# Patient Record
Sex: Male | Born: 1950
Health system: Southern US, Community
[De-identification: ages and names within clinical notes are randomized; demographics above are authoritative.]

## PROBLEM LIST (undated history)

## (undated) DIAGNOSIS — E782 Mixed hyperlipidemia: Secondary | ICD-10-CM

## (undated) DIAGNOSIS — I350 Nonrheumatic aortic (valve) stenosis: Secondary | ICD-10-CM

## (undated) DIAGNOSIS — M1990 Unspecified osteoarthritis, unspecified site: Secondary | ICD-10-CM

## (undated) DIAGNOSIS — I10 Essential (primary) hypertension: Secondary | ICD-10-CM

## (undated) DIAGNOSIS — C439 Malignant melanoma of skin, unspecified: Secondary | ICD-10-CM

## (undated) DIAGNOSIS — F419 Anxiety disorder, unspecified: Secondary | ICD-10-CM

## (undated) DIAGNOSIS — I499 Cardiac arrhythmia, unspecified: Secondary | ICD-10-CM

## (undated) DIAGNOSIS — I2510 Atherosclerotic heart disease of native coronary artery without angina pectoris: Secondary | ICD-10-CM

## (undated) DIAGNOSIS — F32A Depression, unspecified: Secondary | ICD-10-CM

## (undated) DIAGNOSIS — Q2381 Bicuspid aortic valve: Secondary | ICD-10-CM

## (undated) DIAGNOSIS — G4730 Sleep apnea, unspecified: Secondary | ICD-10-CM

## (undated) DIAGNOSIS — I493 Ventricular premature depolarization: Secondary | ICD-10-CM

## (undated) DIAGNOSIS — R011 Cardiac murmur, unspecified: Secondary | ICD-10-CM

## (undated) DIAGNOSIS — Q231 Congenital insufficiency of aortic valve: Secondary | ICD-10-CM

## (undated) HISTORY — DX: Malignant melanoma of skin, unspecified: C43.9

## (undated) HISTORY — DX: Essential (primary) hypertension: I10

## (undated) HISTORY — DX: Bicuspid aortic valve: Q23.81

## (undated) HISTORY — DX: Atherosclerotic heart disease of native coronary artery without angina pectoris: I25.10

## (undated) HISTORY — DX: Ventricular premature depolarization: I49.3

## (undated) HISTORY — DX: Mixed hyperlipidemia: E78.2

## (undated) HISTORY — DX: Nonrheumatic aortic (valve) stenosis: I35.0

## (undated) HISTORY — DX: Unspecified osteoarthritis, unspecified site: M19.90

## (undated) HISTORY — DX: Anxiety disorder, unspecified: F41.9

## (undated) HISTORY — PX: TONSILLECTOMY: SUR1361

## (undated) HISTORY — DX: Congenital insufficiency of aortic valve: Q23.1

---

## 1991-05-25 DIAGNOSIS — C439 Malignant melanoma of skin, unspecified: Secondary | ICD-10-CM

## 1991-05-25 HISTORY — DX: Malignant melanoma of skin, unspecified: C43.9

## 2008-03-18 ENCOUNTER — Ambulatory Visit: Payer: Self-pay | Admitting: Internal Medicine

## 2008-03-18 DIAGNOSIS — I10 Essential (primary) hypertension: Secondary | ICD-10-CM | POA: Insufficient documentation

## 2008-03-18 DIAGNOSIS — M1990 Unspecified osteoarthritis, unspecified site: Secondary | ICD-10-CM | POA: Insufficient documentation

## 2008-03-18 DIAGNOSIS — I493 Ventricular premature depolarization: Secondary | ICD-10-CM | POA: Insufficient documentation

## 2008-03-18 DIAGNOSIS — E785 Hyperlipidemia, unspecified: Secondary | ICD-10-CM | POA: Insufficient documentation

## 2008-03-18 DIAGNOSIS — Z85828 Personal history of other malignant neoplasm of skin: Secondary | ICD-10-CM | POA: Insufficient documentation

## 2008-03-18 DIAGNOSIS — F411 Generalized anxiety disorder: Secondary | ICD-10-CM | POA: Insufficient documentation

## 2008-03-19 ENCOUNTER — Encounter (INDEPENDENT_AMBULATORY_CARE_PROVIDER_SITE_OTHER): Payer: Self-pay | Admitting: Internal Medicine

## 2008-03-20 LAB — CONVERTED CEMR LAB
AST: 17 units/L (ref 0–37)
Alkaline Phosphatase: 63 units/L (ref 39–117)
BUN: 17 mg/dL (ref 6–23)
Calcium: 9.1 mg/dL (ref 8.4–10.5)
Chloride: 106 meq/L (ref 96–112)
Creatinine, Ser: 0.84 mg/dL (ref 0.40–1.50)
HDL: 53 mg/dL (ref >39)
PSA: 0.42 ng/mL (ref 0.10–4.00)
Total Bilirubin: 0.6 mg/dL (ref 0.3–1.2)
Total CHOL/HDL Ratio: 3.8

## 2008-04-08 ENCOUNTER — Encounter (INDEPENDENT_AMBULATORY_CARE_PROVIDER_SITE_OTHER): Payer: Self-pay | Admitting: Internal Medicine

## 2008-04-11 ENCOUNTER — Ambulatory Visit: Payer: Self-pay | Admitting: Internal Medicine

## 2008-10-16 ENCOUNTER — Ambulatory Visit: Payer: Self-pay | Admitting: Internal Medicine

## 2008-10-16 DIAGNOSIS — R002 Palpitations: Secondary | ICD-10-CM | POA: Insufficient documentation

## 2008-10-16 DIAGNOSIS — F1010 Alcohol abuse, uncomplicated: Secondary | ICD-10-CM | POA: Insufficient documentation

## 2008-10-17 ENCOUNTER — Encounter (INDEPENDENT_AMBULATORY_CARE_PROVIDER_SITE_OTHER): Payer: Self-pay | Admitting: Internal Medicine

## 2008-10-17 ENCOUNTER — Encounter (INDEPENDENT_AMBULATORY_CARE_PROVIDER_SITE_OTHER): Payer: Self-pay | Admitting: *Deleted

## 2008-10-17 LAB — CONVERTED CEMR LAB
BUN: 14 mg/dL
CO2: 23 meq/L (ref 19–32)
Calcium: 8.7 mg/dL
Calcium: 8.7 mg/dL (ref 8.4–10.5)
Creatinine, Ser: 0.86 mg/dL (ref 0.40–1.50)
Glucose, Bld: 89 mg/dL
Sodium: 143 meq/L

## 2008-10-23 ENCOUNTER — Emergency Department (HOSPITAL_COMMUNITY): Admission: EM | Admit: 2008-10-23 | Discharge: 2008-10-23 | Payer: Self-pay | Admitting: Emergency Medicine

## 2008-10-24 ENCOUNTER — Observation Stay (HOSPITAL_COMMUNITY): Admission: EM | Admit: 2008-10-24 | Discharge: 2008-10-25 | Payer: Self-pay | Admitting: Emergency Medicine

## 2008-10-24 ENCOUNTER — Ambulatory Visit: Payer: Self-pay | Admitting: Cardiology

## 2008-10-25 ENCOUNTER — Encounter (INDEPENDENT_AMBULATORY_CARE_PROVIDER_SITE_OTHER): Payer: Self-pay | Admitting: Internal Medicine

## 2008-10-25 LAB — CONVERTED CEMR LAB
ALT: 22 units/L
AST: 20 units/L
Alkaline Phosphatase: 70 units/L
Calcium: 9.2 mg/dL
Glucose, Bld: 99 mg/dL
Hemoglobin: 15.8 g/dL
Platelets: 218 10*3/uL
Sodium: 141 meq/L
TSH: 1.557 microintl units/mL
WBC: 5.6 10*3/uL

## 2008-10-31 ENCOUNTER — Encounter (HOSPITAL_COMMUNITY): Admission: RE | Admit: 2008-10-31 | Discharge: 2008-11-30 | Payer: Self-pay | Admitting: Cardiology

## 2008-10-31 ENCOUNTER — Ambulatory Visit: Payer: Self-pay | Admitting: Cardiology

## 2008-11-02 ENCOUNTER — Telehealth: Payer: Self-pay | Admitting: Physician Assistant

## 2008-11-05 ENCOUNTER — Ambulatory Visit: Payer: Self-pay | Admitting: Cardiology

## 2008-11-05 ENCOUNTER — Encounter: Payer: Self-pay | Admitting: Physician Assistant

## 2008-11-05 DIAGNOSIS — R42 Dizziness and giddiness: Secondary | ICD-10-CM | POA: Insufficient documentation

## 2008-11-05 DIAGNOSIS — I472 Ventricular tachycardia: Secondary | ICD-10-CM | POA: Insufficient documentation

## 2008-11-05 DIAGNOSIS — Q230 Congenital stenosis of aortic valve: Secondary | ICD-10-CM | POA: Insufficient documentation

## 2008-11-05 DIAGNOSIS — I350 Nonrheumatic aortic (valve) stenosis: Secondary | ICD-10-CM

## 2008-11-08 ENCOUNTER — Inpatient Hospital Stay (HOSPITAL_BASED_OUTPATIENT_CLINIC_OR_DEPARTMENT_OTHER): Admission: RE | Admit: 2008-11-08 | Discharge: 2008-11-08 | Payer: Self-pay | Admitting: Cardiology

## 2008-11-08 ENCOUNTER — Ambulatory Visit: Payer: Self-pay | Admitting: Cardiology

## 2008-11-11 ENCOUNTER — Telehealth: Payer: Self-pay | Admitting: Cardiology

## 2008-11-21 ENCOUNTER — Ambulatory Visit: Payer: Self-pay | Admitting: Cardiology

## 2008-11-22 ENCOUNTER — Ambulatory Visit: Payer: Self-pay | Admitting: Cardiology

## 2008-11-27 ENCOUNTER — Telehealth (INDEPENDENT_AMBULATORY_CARE_PROVIDER_SITE_OTHER): Payer: Self-pay | Admitting: Internal Medicine

## 2009-01-08 ENCOUNTER — Telehealth (INDEPENDENT_AMBULATORY_CARE_PROVIDER_SITE_OTHER): Payer: Self-pay | Admitting: *Deleted

## 2009-05-28 ENCOUNTER — Encounter (INDEPENDENT_AMBULATORY_CARE_PROVIDER_SITE_OTHER): Payer: Self-pay | Admitting: *Deleted

## 2009-05-29 ENCOUNTER — Ambulatory Visit: Payer: Self-pay | Admitting: Cardiology

## 2009-06-19 ENCOUNTER — Ambulatory Visit: Payer: Self-pay | Admitting: Internal Medicine

## 2009-06-19 DIAGNOSIS — F17200 Nicotine dependence, unspecified, uncomplicated: Secondary | ICD-10-CM | POA: Insufficient documentation

## 2009-06-19 DIAGNOSIS — J309 Allergic rhinitis, unspecified: Secondary | ICD-10-CM | POA: Insufficient documentation

## 2009-07-21 ENCOUNTER — Telehealth: Payer: Self-pay | Admitting: Internal Medicine

## 2009-11-03 ENCOUNTER — Ambulatory Visit: Payer: Self-pay | Admitting: Internal Medicine

## 2009-11-07 ENCOUNTER — Ambulatory Visit: Payer: Self-pay | Admitting: Internal Medicine

## 2009-11-08 LAB — CONVERTED CEMR LAB
BUN: 15 mg/dL (ref 6–23)
Basophils Relative: 1.1 % (ref 0.0–3.0)
Bilirubin, Direct: 0.1 mg/dL (ref 0.0–0.3)
CO2: 30 meq/L (ref 19–32)
Chloride: 107 meq/L (ref 96–112)
Cholesterol: 221 mg/dL — ABNORMAL HIGH (ref 0–200)
Creatinine, Ser: 0.8 mg/dL (ref 0.4–1.5)
Eosinophils Absolute: 0.3 10*3/uL (ref 0.0–0.7)
Hemoglobin, Urine: NEGATIVE
Leukocytes, UA: NEGATIVE
Lymphs Abs: 1.8 10*3/uL (ref 0.7–4.0)
MCHC: 35.3 g/dL (ref 30.0–36.0)
MCV: 99.7 fL (ref 78.0–100.0)
Monocytes Absolute: 0.5 10*3/uL (ref 0.1–1.0)
Neutrophils Relative %: 48.2 % (ref 43.0–77.0)
Nitrite: NEGATIVE
PSA: 0.93 ng/mL (ref 0.10–4.00)
Platelets: 240 10*3/uL (ref 150.0–400.0)
RBC: 4.69 M/uL (ref 4.22–5.81)
TSH: 1.3 microintl units/mL (ref 0.35–5.50)
Total Protein, Urine: NEGATIVE mg/dL
Total Protein: 6.4 g/dL (ref 6.0–8.3)
Urobilinogen, UA: 0.2 (ref 0.0–1.0)

## 2009-11-13 ENCOUNTER — Ambulatory Visit: Payer: Self-pay | Admitting: Internal Medicine

## 2009-11-13 DIAGNOSIS — N529 Male erectile dysfunction, unspecified: Secondary | ICD-10-CM | POA: Insufficient documentation

## 2009-11-27 ENCOUNTER — Encounter: Payer: Self-pay | Admitting: Cardiology

## 2009-11-27 ENCOUNTER — Ambulatory Visit: Payer: Self-pay | Admitting: Cardiology

## 2009-11-27 ENCOUNTER — Ambulatory Visit (HOSPITAL_COMMUNITY): Admission: RE | Admit: 2009-11-27 | Discharge: 2009-11-27 | Payer: Self-pay | Admitting: Cardiology

## 2009-11-28 ENCOUNTER — Encounter: Payer: Self-pay | Admitting: Cardiology

## 2009-12-25 ENCOUNTER — Ambulatory Visit: Payer: Self-pay | Admitting: Cardiology

## 2010-06-19 ENCOUNTER — Telehealth (INDEPENDENT_AMBULATORY_CARE_PROVIDER_SITE_OTHER): Payer: Self-pay

## 2010-06-23 NOTE — Miscellaneous (Signed)
Summary: labsbmp,tsh 10/17/2008  Clinical Lists Changes  Observations: Added new observation of CALCIUM: 8.7 mg/dL (41/96/2229 79:89) Added new observation of CREATININE: 0.86 mg/dL (21/19/4174 08:14) Added new observation of BUN: 14 mg/dL (48/18/5631 49:70) Added new observation of BG RANDOM: 89 mg/dL (26/37/8588 50:27) Added new observation of CO2 PLSM/SER: 23 meq/L (10/17/2008 16:34) Added new observation of CL SERUM: 109 meq/L (10/17/2008 16:34) Added new observation of K SERUM: 4.3 meq/L (10/17/2008 16:34) Added new observation of NA: 143 meq/L (10/17/2008 16:34) Added new observation of TSH: 1.908 microintl units/mL (10/17/2008 16:34)

## 2010-06-23 NOTE — Assessment & Plan Note (Signed)
Summary: 6 mth f/u per checkout on 12/10/08/tg   Visit Type:  Follow-up Primary Provider:  None   History of Present Illness: 60 year old male last seen in the office back in July. He reports that he has not been exercising as regularly. At one point he was walking 5 times a week, 2 or 3 miles at a time. He had better control of occasional palpitations, which have increased somewhat lately. He reports no chest pain or syncope. He reports compliance with medications.  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 He is taking insurance classes at this time, and lives in [place]. He is interested in establishing with a primary care physician in [place].  Last echocardiogram was in June of this year revealing a left ventricular ejection fraction of 55-60% with mild to moderate aortic stenosis, mean gradient 23 mmHg, possible bicuspid valve.  Current Medications (verified): 1)  Accupril 20 Mg Tabs (Quinapril Hcl) .Marland Kitchen.. 1 By Mouth Once Daily 2)  Proventil Hfa 108 (90 Base) Mcg/act Aers (Albuterol Sulfate) .... Prn 3)  Metoprolol Tartrate 25 Mg Tabs (Metoprolol Tartrate) .... Take One  Tablet By Mouth Twice A Day 4)  Simvastatin 40 Mg Tabs (Simvastatin) .... Take One Tablet By Mouth Daily At Bedtime 5)  Nitroglycerin 0.4 Mg Subl (Nitroglycerin) .... One Tablet Under Tongue Every 5 Minutes As Needed For Chest Pain---May Repeat Times Three 6)  Aspir-Low 81 Mg Tbec (Aspirin) .... Take 1 Tab Daily  Allergies (verified): 1)  * Dust  Past History:  Past Medical History: Last updated: 05/28/2009 PVC's Skin cancer, melanoma 1993 Anxiety Asthma Hyperlipidemia Hypertension Arthritis CAD - minimal atherosclerosis, LVEF 60% Aortic Stenosis - mild to moderate Bicuspid aortic valve  Social History: Last updated: 05/28/2009 Divorced lives with SO Current Smoker - 1.5 ppd x 7 years Alcohol use - yes-4 drinks daily, sometimes up to 6--beer Drug use -  no  Review of Systems  The patient denies anorexia, fever, weight loss, chest pain, syncope, dyspnea on exertion, peripheral edema, melena, and hematochezia.         Otherwise reviewed and negative.  Vital Signs:  Patient profile:   60 year old male Weight:      242 pounds Pulse rate:   71 / minute BP sitting:   128 / 86  (right arm)  Vitals Entered By: Dreama Saa, CNA (May 29, 2009 9:37 AM)  Physical Exam  Additional Exam:  Patient comfortable in no acute distress. HEENT: Conjunctiva and lids normal, oropharynx clear. Neck: Supple, no carotid bruits or thyromegaly. Lungs: Clear to auscultation. Cardiac: Regular rate and rhythm, 2/6 systolic murmur at the base, second heart sound preserved, no S3 gallop. Extremity: No pitting edema.   Impression & Recommendations:  Problem # 1:  AORTIC STENOSIS (ICD-424.1)  Mild to moderate aortic stenosis, with possible bicuspid aortic valve. Patient is not clearly symptomatic due to this. Plan will be to followup with a 2-D echocardiogram prior to next visit in 6 months.  His updated medication list for this problem includes:    Accupril 20 Mg Tabs (Quinapril hcl) .Marland Kitchen... 1 by mouth once daily    Metoprolol Tartrate 25 Mg Tabs (Metoprolol tartrate) .Marland Kitchen... Take one  tablet by mouth twice a day    Nitroglycerin 0.4 Mg Subl (Nitroglycerin) ..... One tablet under tongue every 5 minutes as needed for chest pain---may repeat times three  Orders: 2-D Echocardiogram (2D Echo)  Problem # 2:  VENTRICULAR TACHYCARDIA (ICD-427.1)  Patient with  history of PVCs, and a brief nonsustained ventricular tachycardia. This is in the face of normal left ventricular systolic function, and cardiac catheterization demonstrating no significant obstructive coronary artery disease. Patient is on beta blocker therapy. I have recommended reduction in caffeine, and also return to more regular aerobic exercise. He was doing better when he was more active, with few  if any palpitations. He has had no syncope.  The following medications were removed from the medication list:    Aspirin Ec 325 Mg Tbec (Aspirin) .Marland Kitchen... Take one tablet by mouth daily His updated medication list for this problem includes:    Accupril 20 Mg Tabs (Quinapril hcl) .Marland Kitchen... 1 by mouth once daily    Metoprolol Tartrate 25 Mg Tabs (Metoprolol tartrate) .Marland Kitchen... Take one  tablet by mouth twice a day    Nitroglycerin 0.4 Mg Subl (Nitroglycerin) ..... One tablet under tongue every 5 minutes as needed for chest pain---may repeat times three    Aspir-low 81 Mg Tbec (Aspirin) .Marland Kitchen... Take 1 tab daily  The following medications were removed from the medication list:    Aspirin Ec 325 Mg Tbec (Aspirin) .Marland Kitchen... Take one tablet by mouth daily His updated medication list for this problem includes:    Accupril 20 Mg Tabs (Quinapril hcl) .Marland Kitchen... 1 by mouth once daily    Metoprolol Tartrate 25 Mg Tabs (Metoprolol tartrate) .Marland Kitchen... Take one  tablet by mouth twice a day    Nitroglycerin 0.4 Mg Subl (Nitroglycerin) ..... One tablet under tongue every 5 minutes as needed for chest pain---may repeat times three    Aspir-low 81 Mg Tbec (Aspirin) .Marland Kitchen... Take 1 tab daily  Other Orders: Misc. Referral (Misc. Ref)  Patient Instructions: 1)  Your physician recommends that you schedule a follow-up appointment in: 6 months 2)  Your physician recommends that you continue on your current medications as directed. Please refer to the Current Medication list given to you today. 3)  Your physician has requested that you have an echocardiogram.  Echocardiography is a painless test that uses sound waves to create images of your heart. It provides your doctor with information about the size and shape of your heart and how well your heart's chambers and valves are working.  This procedure takes approximately one hour. There are no restrictions for this procedure.

## 2010-06-23 NOTE — Assessment & Plan Note (Signed)
Summary: cpx/bcbs/#/cd   Vital Signs:  Patient profile:   60 year old male Height:      74 inches (187.96 cm) Weight:      230.8 pounds (104.91 kg) O2 Sat:      95 % on Room air Temp:     98.0 degrees F (36.67 degrees C) oral Pulse rate:   81 / minute BP sitting:   118 / 88  (left arm) Cuff size:   large  Vitals Entered By: Orlan Leavens (November 13, 2009 9:07 AM)  O2 Flow:  Room air CC: CPX Is Patient Diabetic? No Pain Assessment Patient in pain? no        Primary Care Provider:  Newt Lukes, MD  CC:  CPX.  History of Present Illness: patient is here today for annual physical. Patient feels well and has no complaints.   also reviewed other chronic issues: 1) asthma - worse during cold months - also worse in the house due to allergens - cat, dust continues to smoke - but ready to quit - ?chantix  2) allergies - +nasal drainage and eye water/itch - wosre with cat, dust in house setting never on shots, uses OTC meds for same  3) HTN - reports compliance with ongoing medical treatment and no changes in medication dose or frequency. denies adverse side effects related to current therapy. no CP or SOB or edema, no HA  4) dyslipidemia - self directed discontinuation of medical treatment due to myalgias - symptoms improved off tx - wished not to be on any med tx for this  5) anxiety - reports compliance with ongoing medical treatment and no changes in medication dose or frequency. denies adverse side effects related to current therapy. no panic symptoms or exac  6) c/o ED - recent stressors in relationship related to "g-friend's menopause depression", no longer living together but now "relationship is at least back together"  Preventive Screening-Counseling & Management  Alcohol-Tobacco     Alcohol drinks/day: 4     Alcohol type: beer     >5/day in last 3 mos: yes     Alcohol Counseling: to decrease amount and/or frequency of alcohol intake     Smoking Status:  current     Smoking Cessation Counseling: yes     Smoke Cessation Stage: ready     Packs/Day: 1.5     Tobacco Counseling: to quit use of tobacco products  Caffeine-Diet-Exercise     Does Patient Exercise: no     Exercise Counseling: to improve exercise regimen  Safety-Violence-Falls     Seat Belt Counseling: not indicated; patient wears seat belts     Helmet Counseling: not indicated; patient wears helmet when riding bicycle/motocycle     Fall Risk Counseling: not indicated; no significant falls noted  Clinical Review Panels:  Prevention   Last PSA:  0.93 (11/07/2009)  Immunizations   Last Flu Vaccine:  Fluvax 3+ (06/19/2009)   Last Pneumovax:  Pneumovax (04/11/2008)  Lipid Management   Cholesterol:  221 (11/07/2009)   LDL (bad choesterol):  142 (10/25/2008)   HDL (good cholesterol):  53.70 (11/07/2009)   Triglycerides:  89 (10/25/2008)  Diabetes Management   Creatinine:  0.8 (11/07/2009)   Last Flu Vaccine:  Fluvax 3+ (06/19/2009)   Last Pneumovax:  Pneumovax (04/11/2008)  CBC   WBC:  5.2 (11/07/2009)   RBC:  4.69 (11/07/2009)   Hgb:  16.5 (11/07/2009)   Hct:  46.8 (11/07/2009)   Platelets:  240.0 (11/07/2009)   MCV  99.7 (11/07/2009)   MCHC  35.3 (11/07/2009)   RDW  13.4 (11/07/2009)   PMN:  48.2 (11/07/2009)   Lymphs:  34.0 (11/07/2009)   Monos:  10.5 (11/07/2009)   Eosinophils:  6.2 (11/07/2009)   Basophil:  1.1 (11/07/2009)  Complete Metabolic Panel   Glucose:  106 (11/07/2009)   Sodium:  142 (11/07/2009)   Potassium:  4.8 (11/07/2009)   Chloride:  107 (11/07/2009)   CO2:  30 (11/07/2009)   BUN:  15 (11/07/2009)   Creatinine:  0.8 (11/07/2009)   Albumin:  4.2 (11/07/2009)   Total Protein:  6.4 (11/07/2009)   Calcium:  9.0 (11/07/2009)   Total Bili:  0.7 (11/07/2009)   Alk Phos:  74 (11/07/2009)   SGPT (ALT):  26 (11/07/2009)   SGOT (AST):  24 (11/07/2009)   Contraindications/Deferment of Procedures/Staging:    Test/Procedure: Colonoscopy     Reason for deferment: patient declined   Current Medications (verified): 1)  Accupril 20 Mg Tabs (Quinapril Hcl) .Marland Kitchen.. 1 By Mouth Once Daily 2)  Proventil Hfa 108 (90 Base) Mcg/act Aers (Albuterol Sulfate) .... 2 Inhalations Every 4 Hours As Needed For Shortness of Breath or Wheeze 3)  Metoprolol Tartrate 25 Mg Tabs (Metoprolol Tartrate) .... Take One  Tablet By Mouth Twice A Day 4)  Simvastatin 40 Mg Tabs (Simvastatin) .... Take One Tablet By Mouth Daily At Bedtime 5)  Nitroglycerin 0.4 Mg Subl (Nitroglycerin) .... One Tablet Under Tongue Every 5 Minutes As Needed For Chest Pain---May Repeat Times Three 6)  Aspir-Low 81 Mg Tbec (Aspirin) .... Take 1 Tab Daily 7)  Loratadine 10 Mg Tabs (Loratadine) .Marland Kitchen.. 1 By Mouth Once Daily 8)  Qvar 80 Mcg/act Aers (Beclomethasone Dipropionate) .... 2 Inhalations Two Times A Day 9)  Effexor Xr 75 Mg Xr24h-Cap (Venlafaxine Hcl) .Marland Kitchen.. 1 By Mouth Once Daily  Allergies (verified): 1)  * Dust  Past History:  Past Medical History: PVC's Skin cancer, melanoma 1993 Anxiety Asthma Hyperlipidemia Hypertension Arthritis CAD - minimal atherosclerosis, LVEF 60% Aortic Stenosis - mild to moderate Bicuspid aortic valve  MD roster: cards - mcdowell  Past Surgical History: Tonsillectomy - childhood   Family History: Father - deceased-83-CVD Mother - deceased-89-dementia Sister-62 Daughter - 58  Son - 107 Daughter - 73  Social History: Divorced, lives alone but has girlfriend Current Smoker - 1.5 ppd  Alcohol use - yes-4 drinks daily, sometimes up to 6--beer/wine Drug use - no  Review of Systems       c/o ED (for encounters after 10p) - otherwise, see HPI above. I have reviewed all other systems and they were negative.   Physical Exam  General:  alert, well-developed, well-nourished, and cooperative to examination.    Eyes:  vision grossly intact; pupils equal, round and reactive to light.  conjunctiva and lids normal.   wears corr lenses Ears:   normal pinnae bilaterally, without erythema, swelling, or tenderness to palpation. TMs clear, without effusion, mod cerumen L>R w/o impaction. Hearing grossly normal bilaterally  Mouth:  teeth and gums in good repair; mucous membranes moist, without lesions or ulcers. oropharynx clear without exudate, no erythema.  Neck:  supple, full ROM, no masses, no thyromegaly; no thyroid nodules or tenderness. no JVD or carotid bruits.   Lungs:  normal respiratory effort, no intercostal retractions or use of accessory muscles; normal breath sounds bilaterally - no crackles and no wheezes.    Heart:  normal rate, regular rhythm, no murmur, and no rub. BLE without edema.  Abdomen:  soft, non-tender,  normal bowel sounds, no distention; no masses and no appreciable hepatomegaly or splenomegaly.   Msk:  No deformity or scoliosis noted of thoracic or lumbar spine.   Neurologic:  alert & oriented X3 and cranial nerves II-XII symetrically intact.  strength normal in all extremities, sensation intact to light touch, and gait normal. speech fluent without dysarthria or aphasia; follows commands with good comprehension.  Skin:  no rashes, vesicles, ulcers, or erythema. No nodules or irregularity to palpation.  Psych:  Oriented X3, memory intact for recent and remote, normally interactive, good eye contact, not anxious appearing, min depressed appearing, and not agitated.   dysphoric affect.     Impression & Recommendations:  Problem # 1:  PREVENTIVE HEALTH CARE (ICD-V70.0) Patient has been counseled on age-appropriate routine health concerns for screening and prevention. These are reviewed and up-to-date. Immunizations are up-to-date or declined. Labs and ECG reviewed.  Orders: EKG w/ Interpretation (93000)  Problem # 2:  HYPERLIPIDEMIA (ICD-272.4)  pt off statin due to myalgias - no sig CAD on cath -  will follow FLP 6-12 mos and work on other risk factors - see next The following medications were removed from  the medication list:    Simvastatin 40 Mg Tabs (Simvastatin) .Marland Kitchen... Take one tablet by mouth daily at bedtime  Labs Reviewed: SGOT: 24 (11/07/2009)   SGPT: 26 (11/07/2009)   HDL:53.70 (11/07/2009), 32 (10/25/2008)  LDL:142 (10/25/2008), 126 (03/19/2008)  Chol:221 (11/07/2009), 192 (10/25/2008)  Trig:80.0 (11/07/2009), 89 (10/25/2008)  Problem # 3:  ERECTILE DYSFUNCTION, ORGANIC (ICD-607.84)  His updated medication list for this problem includes:    Cialis 20 Mg Tabs (Tadalafil) .Marland Kitchen... 1 as needed hour prior to intercourse  Discussed proper use of medications, as well as side effects.   Orders: Tobacco use cessation intermediate 3-10 minutes (11914) Prescription Created Electronically [phone])  Problem # 4:  TOBACCO ABUSE (ICD-305.1)  ready to quit - requests chantix 5 minutes today spent on patient education regarding the unhealthy effects of continued tobacco abuse and encouragment of cessation including medical options available to help patient to quit smoking. will also help ED symptoms  His updated medication list for this problem includes:    Chantix Starting Month Pak 0.5 Mg X 11 & 1 Mg X 42 Tabs (Varenicline tartrate) .Marland Kitchen... As directed    Chantix Continuing Month Pak 1 Mg Tabs (Varenicline tartrate) .Marland Kitchen... As directed  Orders: Tobacco use cessation intermediate 3-10 minutes (62130) Prescription Created Electronically [phone])  Complete Medication List: 1)  Accupril 20 Mg Tabs (Quinapril hcl) .Marland Kitchen.. 1 by mouth once daily 2)  Proventil Hfa 108 (90 Base) Mcg/act Aers (Albuterol sulfate) .... 2 inhalations every 4 hours as needed for shortness of breath or wheeze 3)  Metoprolol Tartrate 25 Mg Tabs (Metoprolol tartrate) .... Take one  tablet by mouth twice a day 4)  Aspir-low 81 Mg Tbec (Aspirin) .... Take 1 tab daily 5)  Loratadine 10 Mg Tabs (Loratadine) .Marland Kitchen.. 1 by mouth once daily 6)  Qvar 80 Mcg/act Aers (Beclomethasone dipropionate) .... 2 inhalations two times a day 7)  Effexor  Xr 75 Mg Xr24h-cap (Venlafaxine hcl) .Marland Kitchen.. 1 by mouth once daily 8)  Chantix Starting Month Pak 0.5 Mg X 11 & 1 Mg X 42 Tabs (Varenicline tartrate) .... As directed 9)  Chantix Continuing Month Pak 1 Mg Tabs (Varenicline tartrate) .... As directed 10)  Cialis 20 Mg Tabs (Tadalafil) .Marland Kitchen.. 1 as needed hour prior to intercourse  Patient Instructions: 1)  it was good to see you  today. 2)  labs, EKG and exam look good today - 3)  chantix as discussed - call for appointment if still smoking in 3 months - 4)  cialis as discussed - stop/do not use if any chest pain  5)  ok to stay off simvastatin for now - will follow these levels every 6-12 months 6)  Please schedule a follow-up appointment in 6-12 months, sooner if problems.  Prescriptions: CIALIS 20 MG TABS (TADALAFIL) 1 as needed hour prior to intercourse  #6 x 1   Entered and Authorized by:   Newt Lukes MD   Signed by:   Newt Lukes MD on 11/13/2009   Method used:   Electronically to        [facility]  [phone]* (retail)       [address]       [place], [place]  [address]       Ph: [phone] or [phone]       Fax: [phone]   RxID:   [id number] CHANTIX CONTINUING MONTH PAK 1 MG TABS (VARENICLINE TARTRATE) as directed  #1 x 2   Entered and Authorized by:   Newt Lukes MD   Signed by:   Newt Lukes MD on 11/13/2009   Method used:   Electronically to        [facility]  [phone]* (retail)       [address]       [place], [place]  [address]       Ph: [phone] or [phone]       Fax: [phone]   RxID:   [id number] CHANTIX STARTING MONTH PAK 0.5 MG X 11 & 1 MG X 42 TABS (VARENICLINE TARTRATE) as directed  #1 x 0   Entered and Authorized by:   Newt Lukes MD   Signed by:   Newt Lukes MD on 11/13/2009   Method used:   Electronically to        [facility]  [phone]* (retail)       [address]       [place], [place]  [address]       Ph: [phone] or [phone]       Fax: [phone]   RxID:   [id number]

## 2010-06-23 NOTE — Assessment & Plan Note (Signed)
Summary: 6 mth f/u per checkout on 05/29/09/tg   Visit Type:  Follow-up Primary Aryella Besecker:  Dr. Newt Lukes   History of Present Illness: 60 year old male presents for followup. Reports no problems with angina or progressive shortness of breath. No palpitations or syncope.  Followup labs from June showed cholesterol 221, HDL 53, triglycerides 80, LDL 150, AST 24, ALT 26, hemoglobin 16.5, BUN 15, creatinine 0.8, potassium 4.8. He states he was having some leg discomfort on simvastatin, and this was subsequently discontinued.  Followup echocardiogram from July is reviewed below.  We talked about a basic exercise regimen.  Current Medications (verified): 1)  Accupril 20 Mg Tabs (Quinapril Hcl) .Marland Kitchen.. 1 By Mouth Once Daily 2)  Proventil Hfa 108 (90 Base) Mcg/act Aers (Albuterol Sulfate) .... 2 Inhalations Every 4 Hours As Needed For Shortness of Breath or Wheeze 3)  Metoprolol Tartrate 25 Mg Tabs (Metoprolol Tartrate) .... Take One  Tablet By Mouth Twice A Day 4)  Aspir-Low 81 Mg Tbec (Aspirin) .... Take 1 Tab Daily 5)  Loratadine 10 Mg Tabs (Loratadine) .Marland Kitchen.. 1 By Mouth Once Daily 6)  Qvar 80 Mcg/act Aers (Beclomethasone Dipropionate) .... 2 Inhalations Two Times A Day 7)  Effexor Xr 75 Mg Xr24h-Cap (Venlafaxine Hcl) .Marland Kitchen.. 1 By Mouth Once Daily  Allergies (verified): 1)  * Dust  Past History:  Past Medical History: Last updated: 11/13/2009 PVC's Skin cancer, melanoma 1993 Anxiety Asthma Hyperlipidemia Hypertension Arthritis CAD - minimal atherosclerosis, LVEF 60% Aortic Stenosis - mild to moderate Bicuspid aortic valve  MD roster: cards - mcdowell  Social History: Last updated: 11/13/2009 Divorced, lives alone but has girlfriend Current Smoker - 1.5 ppd  Alcohol use - yes-4 drinks daily, sometimes up to 6--beer/wine Drug use - no  Review of Systems  The patient denies anorexia, fever, weight loss, chest pain, syncope, dyspnea on exertion, peripheral edema, melena,  and hematochezia.         Otherwise reviewed and negative.  Vital Signs:  Patient profile:   60 year old male Weight:      231 pounds Pulse rate:   76 / minute BP sitting:   126 / 85  (right arm)  Vitals Entered By: Dreama Saa, CNA (December 25, 2009 1:22 PM)  Physical Exam  Additional Exam:  Patient comfortable in no acute distress. HEENT: Conjunctiva and lids normal, oropharynx clear. Neck: Supple, no carotid bruits or thyromegaly. Lungs: Clear to auscultation. Cardiac: Regular rate and rhythm, 2/6 systolic murmur at the base, second heart sound preserved, no S3 gallop. Extremity: No pitting edema.   Echocardiogram  Procedure date:  11/27/2009  Findings:      Study Conclusions            - Left ventricle: The cavity size was normal. Wall thickness was       increased in a pattern of mild LVH. Systolic function was normal.       The estimated ejection fraction was in the range of 55% to 60%.       Wall motion was normal; there were no regional wall motion       abnormalities. Doppler parameters are consistent with abnormal       left ventricular relaxation (grade 1 diastolic dysfunction).     - Aortic valve: Mildly calcified annulus. A bicuspid morphology       cannot be excluded; moderately calcified leaflets. Cusp separation       was reduced. There was mild stenosis. Trivial regurgitation. Mean  gradient: 15mm Hg (S). Valve area: 1.31cm 2(VTI). Valve area:       1.19cm 2 (Vmax).     - Mitral valve: Trivial regurgitation.     - Tricuspid valve: Trivial regurgitation.     - Pericardium, extracardiac: There was no pericardial effusion.  EKG  Procedure date:  12/25/2009  Findings:      Sinus rhythm at 76, left atrial enlargement, incomplete right bundle branch block.  Impression & Recommendations:  Problem # 1:  AORTIC STENOSIS (ICD-424.1)  Overall mild with bicuspid aortic valve and mean gradient of 15 mm mercury by most recent echocardiogram. Only  trivial aortic regurgitation noted. Follow clinically at this point.  His updated medication list for this problem includes:    Accupril 20 Mg Tabs (Quinapril hcl) .Marland Kitchen... 1 by mouth once daily    Metoprolol Tartrate 25 Mg Tabs (Metoprolol tartrate) .Marland Kitchen... Take one  tablet by mouth twice a day  Problem # 2:  HYPERTENSION (ICD-401.9)  Blood pressure well controlled today.  His updated medication list for this problem includes:    Accupril 20 Mg Tabs (Quinapril hcl) .Marland Kitchen... 1 by mouth once daily    Metoprolol Tartrate 25 Mg Tabs (Metoprolol tartrate) .Marland Kitchen... Take one  tablet by mouth twice a day    Aspir-low 81 Mg Tbec (Aspirin) .Marland Kitchen... Take 1 tab daily  Problem # 3:  HYPERLIPIDEMIA (ICD-272.4)  LDL is suboptimal. He is now off simvastatin related to intolerance. Could consider Lipitor or Crestor. He has followup to discuss this with Dr. Felicity Coyer.  Patient Instructions: 1)  Your physician recommends that you schedule a follow-up appointment in: 6 months 2)  Your physician recommends that you continue on your current medications as directed. Please refer to the Current Medication list given to you today.

## 2010-06-23 NOTE — Progress Notes (Signed)
Summary: quinapril  Phone Note Refill Request Message from:  Fax from Pharmacy on July 21, 2009 10:14 AM  Refills Requested: Medication #1:  ACCUPRIL 20 MG TABS 1 by mouth once daily [facility] [phone]   Method Requested: Fax to Mail Away Pharmacy Initial call taken by: Orlan Leavens,  July 21, 2009 10:14 AM  Follow-up for Phone Call        Md recieved paper req from [facility] renewal for quinapril 20mg  # 90. Md sign and faxed back to [facility]. updating EMR Follow-up by: Orlan Leavens,  July 21, 2009 10:15 AM    Prescriptions: ACCUPRIL 20 MG TABS (QUINAPRIL HCL) 1 by mouth once daily  #90 x 3   Entered by:   Orlan Leavens   Authorized by:   Newt Lukes MD   Signed by:   Orlan Leavens on 07/21/2009   Method used:   Historical   RxID:   [id number]

## 2010-06-23 NOTE — Letter (Signed)
Summary: [place] Results Engineer, agricultural at [facility]  [address] [place] [address]   Phone: [phone]  Fax: [phone]      November 28, 2009 MRN: [id number]   Danny Johnson [address] [place], [place]  [address]   Dear Mr. Procida,  Your test ordered by Selena Batten has been reviewed by your physician (or physician assistant) and was found to be normal or stable. Your physician (or physician assistant) felt no changes were needed at this time.  __X__ Echocardiogram  ____ Cardiac Stress Test  ____ Lab Work  ____ Peripheral vascular study of arms, legs or neck  ____ CT scan or X-ray  ____ Lung or Breathing test  ____ Other: Please continue on current medical treatment.  Thank you.   Nona Dell, MD, F.A.C.C

## 2010-06-23 NOTE — Assessment & Plan Note (Signed)
Summary: NEW [employer] PT--PER TERRY/DR MCDOWELL-#--STC   Vital Signs:  Patient profile:   60 year old male Height:      74 inches (187.96 cm) Weight:      236 pounds (107.27 kg) BMI:     30.41 O2 Sat:      95 % on Room air Temp:     97.9 degrees F (36.61 degrees C) oral Pulse rate:   84 / minute BP sitting:   120 / 72  (left arm) Cuff size:   large  Vitals Entered ByZella Ball Ewing (June 19, 2009 8:14 AM)  Nutrition Counseling: Patient's BMI is greater than 25 and therefore counseled on weight management options.  O2 Flow:  Room air CC: New Pt. get established/RE   Primary Care Provider:  Newt Lukes, MD  CC:  New Pt. get established/RE.  History of Present Illness: new pt to me, here to est care -  1) asthma - worse during cold months - also worse in the house due to allergens - cat, dust continues to smoke - ?subst for singular due to cost - needs refill on proventil  2) allergies - +nasal drainage and eye water/itch - wosre with cat, dust in house setting never on shots, not on any OTC meds for same  3) HTN - reports compliance with ongoing medical treatment and no changes in medication dose or frequency. denies adverse side effects related to current therapy.   4) dyslipidemia - reports compliance with ongoing medical treatment and no changes in medication dose or frequency. denies adverse side effects related to current therapy.   5) anxiety - prev on effexor but off last 6 weeks due to running out - can feel the nervousness and panic feeling coming back - symptoms made worse by unemployment state would like to resume meds   Preventive Screening-Counseling & Management  Alcohol-Tobacco     Alcohol drinks/day: 4     Alcohol type: beer     >5/day in last 3 mos: yes     Alcohol Counseling: to decrease amount and/or frequency of alcohol intake     Smoking Status: current     Smoking Cessation Counseling: yes     Packs/Day: 1.5     Tobacco  Counseling: to quit use of tobacco products  Caffeine-Diet-Exercise     Does Patient Exercise: no     Exercise Counseling: to improve exercise regimen  Clinical Review Panels:  Prevention   Last PSA:  0.42 (03/19/2008)  Immunizations   Last Flu Vaccine:  Fluvax 3+ (06/19/2009)   Last Pneumovax:  Pneumovax (04/11/2008)  Lipid Management   Cholesterol:  192 (10/25/2008)   LDL (bad choesterol):  142 (10/25/2008)   HDL (good cholesterol):  32 (10/25/2008)   Triglycerides:  89 (10/25/2008)  CBC   WBC:  5.6 (10/25/2008)   Hgb:  15.8 (10/25/2008)   Platelets:  218 (10/25/2008)  Complete Metabolic Panel   Glucose:  99 (10/25/2008)   Sodium:  141 (10/25/2008)   Potassium:  4.9 (10/25/2008)   Chloride:  107 (10/25/2008)   CO2:  31 (10/25/2008)   BUN:  12 (10/25/2008)   Creatinine:  0.94 (10/25/2008)   Albumin:  3.5 (10/25/2008)   Total Protein:  5.8 (10/25/2008)   Calcium:  9.2 (10/25/2008)   Total Bili:  0.5 (10/25/2008)   Alk Phos:  70 (10/25/2008)   SGPT (ALT):  22 (10/25/2008)   SGOT (AST):  20 (10/25/2008)   -  Date:  10/25/2008    BG  Random: 99    BUN: 12    Creatinine: 0.94    Sodium: 141    Potassium: 4.9    Chloride: 107    CO2 Total: 31    SGOT (AST): 20    SGPT (ALT): 22    T. Bilirubin: 0.5    Alk Phos: 70    Calcium: 9.2    Total Protein: 5.8    Albumin: 3.5    Cholesterol: 192    LDL: 142    HDL: 32    Triglycerides: 89    TSH: 1.557    WBC: 5.6    HGB: 15.8    PLT: 218  Current Medications (verified): 1)  Accupril 20 Mg Tabs (Quinapril Hcl) .Marland Kitchen.. 1 By Mouth Once Daily 2)  Proventil Hfa 108 (90 Base) Mcg/act Aers (Albuterol Sulfate) .... Prn 3)  Metoprolol Tartrate 25 Mg Tabs (Metoprolol Tartrate) .... Take One  Tablet By Mouth Twice A Day 4)  Simvastatin 40 Mg Tabs (Simvastatin) .... Take One Tablet By Mouth Daily At Bedtime 5)  Nitroglycerin 0.4 Mg Subl (Nitroglycerin) .... One Tablet Under Tongue Every 5 Minutes As Needed For Chest  Pain---May Repeat Times Three 6)  Aspir-Low 81 Mg Tbec (Aspirin) .... Take 1 Tab Daily  Allergies (verified): 1)  * Dust  Past History:  Family History: Last updated: 06/08/09 Father - deceased-83-CVD Mother - deceased-89-dementia Sister-62 Daughter - 10 Son - 30 Daughter - 26  Social History: Last updated: 2009-06-08 Divorced lives with SO Current Smoker - 1.5 ppd x 7 years Alcohol use - yes-4 drinks daily, sometimes up to 6--beer Drug use - no  Past medical, surgical, family and social histories (including risk factors) reviewed, and no changes noted (except as noted below).  Past Medical History: Reviewed history from 08-Jun-2009 and no changes required. PVC's Skin cancer, melanoma 1993 Anxiety Asthma Hyperlipidemia Hypertension Arthritis CAD - minimal atherosclerosis, LVEF 60% Aortic Stenosis - mild to moderate Bicuspid aortic valve  Past Surgical History: Reviewed history from 2009/06/08 and no changes required. Tonsillectomy - childhood  Family History: Reviewed history from 2009-06-08 and no changes required. Father - deceased-83-CVD Mother - Silvestre Moment Sister-62 Daughter - 47 Son - 55 Daughter - 37  Social History: Reviewed history from June 08, 2009 and no changes required. Divorced lives with SO Current Smoker - 1.5 ppd x 7 years Alcohol use - yes-4 drinks daily, sometimes up to 6--beer Drug use - no Does Patient Exercise:  no  Review of Systems       see HPI above. I have reviewed all other systems and they were negative.   Physical Exam  General:  alert, well-developed, well-nourished, and cooperative to examination.    Eyes:  vision grossly intact; pupils equal, round and reactive to light.  conjunctiva and lids normal.   wears corr lenses Ears:  normal pinnae bilaterally, without erythema, swelling, or tenderness to palpation. TMs clear, without effusion, mod cerumen L>R w/o impaction. Hearing grossly normal bilaterally    Mouth:  teeth and gums in good repair; mucous membranes moist, without lesions or ulcers. oropharynx clear without exudate, mild erythema. +PND - thin clear Lungs:  normal respiratory effort, no intercostal retractions or use of accessory muscles; normal breath sounds bilaterally - no crackles and no wheezes.    Heart:  normal rate, regular rhythm, no murmur, and no rub. BLE without edema.  Abdomen:  soft, non-tender, normal bowel sounds, no distention; no masses and no appreciable hepatomegaly or splenomegaly.   Msk:  No deformity or  scoliosis noted of thoracic or lumbar spine.   Neurologic:  alert & oriented X3 and cranial nerves II-XII symetrically intact.  strength normal in all extremities, sensation intact to light touch, and gait normal. speech fluent without dysarthria or aphasia; follows commands with good comprehension.  Psych:  Oriented X3, memory intact for recent and remote, normally interactive, good eye contact, not anxious appearing, min depressed appearing, and not agitated.   dysphoric affect.     Impression & Recommendations:  Problem # 1:  ASTHMA (ICD-493.90)  multiple triggers - allergens, smoke, ?underlying emphysema.... add daily loridatdine and steroid MDI, cont as needed Alb rescue MDI also quit smoking encouraged --- consider PFTs but pt declines at this time due to cost concerns.... His updated medication list for this problem includes:    Proventil Hfa 108 (90 Base) Mcg/act Aers (Albuterol sulfate) .Marland Kitchen... 2 inhalations every 4 hours as needed for shortness of breath or wheeze    Qvar 80 Mcg/act Aers (Beclomethasone dipropionate) .Marland Kitchen... 2 inhalations two times a day  Pulmonary Functions Reviewed: O2 sat: 95 (06/19/2009)  Orders: Tobacco use cessation intermediate 3-10 minutes (87564) Prescription Created Electronically [phone])  Problem # 2:  HYPERTENSION (ICD-401.9)  His updated medication list for this problem includes:    Accupril 20 Mg Tabs (Quinapril hcl)  .Marland Kitchen... 1 by mouth once daily    Metoprolol Tartrate 25 Mg Tabs (Metoprolol tartrate) .Marland Kitchen... Take one  tablet by mouth twice a day  BP today: 120/72 Prior BP: 128/86 (05/29/2009)  Labs Reviewed: K+: 4.9 (10/25/2008) Creat: : 0.94 (10/25/2008)   Chol: 192 (10/25/2008)   HDL: 32 (10/25/2008)   LDL: 142 (10/25/2008)   TG: 89 (10/25/2008)  Problem # 3:  HYPERLIPIDEMIA (ICD-272.4)  His updated medication list for this problem includes:    Simvastatin 40 Mg Tabs (Simvastatin) .Marland Kitchen... Take one tablet by mouth daily at bedtime  Labs Reviewed: SGOT: 20 (10/25/2008)   SGPT: 22 (10/25/2008)   HDL:32 (10/25/2008), 53 (03/19/2008)  LDL:142 (10/25/2008), 126 (03/19/2008)  Chol:192 (10/25/2008), 202 (03/19/2008)  Trig:89 (10/25/2008), 115 (03/19/2008)  Problem # 4:  ANXIETY (ICD-300.00)  His updated medication list for this problem includes:    Effexor Xr 75 Mg Xr24h-cap (Venlafaxine hcl) .Marland Kitchen... 1 by mouth once daily  Discussed medication use and relaxation techniques.   Orders: Prescription Created Electronically [phone])  Problem # 5:  ALLERGIC RHINITIS (ICD-477.9)  His updated medication list for this problem includes:    Loratadine 10 Mg Tabs (Loratadine) .Marland Kitchen... 1 by mouth once daily  Discussed use of allergy medications and environmental measures.   Orders: Tobacco use cessation intermediate 3-10 minutes (66063) Prescription Created Electronically [phone])  Problem # 6:  TOBACCO ABUSE (ICD-305.1)  5 minutes today spent on patient education regarding the unhealthy effects of continued tobacco abuse and encouragment of cessation including medical options available to help patient to quit smoking.   Orders: Tobacco use cessation intermediate 3-10 minutes (99406)  Complete Medication List: 1)  Accupril 20 Mg Tabs (Quinapril hcl) .Marland Kitchen.. 1 by mouth once daily 2)  Proventil Hfa 108 (90 Base) Mcg/act Aers (Albuterol sulfate) .... 2 inhalations every 4 hours as needed for shortness of breath or  wheeze 3)  Metoprolol Tartrate 25 Mg Tabs (Metoprolol tartrate) .... Take one  tablet by mouth twice a day 4)  Simvastatin 40 Mg Tabs (Simvastatin) .... Take one tablet by mouth daily at bedtime 5)  Nitroglycerin 0.4 Mg Subl (Nitroglycerin) .... One tablet under tongue every 5 minutes as needed for chest pain---may repeat  times three 6)  Aspir-low 81 Mg Tbec (Aspirin) .... Take 1 tab daily 7)  Loratadine 10 Mg Tabs (Loratadine) .Marland Kitchen.. 1 by mouth once daily 8)  Qvar 80 Mcg/act Aers (Beclomethasone dipropionate) .... 2 inhalations two times a day 9)  Effexor Xr 75 Mg Xr24h-cap (Venlafaxine hcl) .Marland Kitchen.. 1 by mouth once daily  Other Orders: Admin 1st Vaccine (62130) Flu Vaccine 63yrs + (86578)  Patient Instructions: 1)  it was good to see you today.  2)  will add inhaled steroid (Qvar) and generic claritin for your allergy and asthma symptoms - continue rescue inhaler as needed -  3)  resume effexor  4)  your prescriptions have been electronically submitted to your pharmacy. Please take as directed. Contact our office if you believe you're having problems with the medication(s).  ([facility]) 5)  no other medication changes recommended - continue all other medications as reviewed and discussed today 6)  Tobacco is very bad for your health and your loved ones! You Should stop smoking! 7)  Please schedule a follow-up appointment in 4-6 months, sooner if problems.  Prescriptions: EFFEXOR XR 75 MG XR24H-CAP (VENLAFAXINE HCL) 1 by mouth once daily  #90 x 3   Entered and Authorized by:   Newt Lukes MD   Signed by:   Newt Lukes MD on 06/19/2009   Method used:   Electronically to        [facility]* (mail-order)             ,          Ph: [phone]       Fax: [phone]   RxID:   [id number] LORATADINE 10 MG TABS (LORATADINE) 1 by mouth once daily  #90 x 3   Entered and Authorized by:   Newt Lukes MD   Signed by:   Newt Lukes MD on 06/19/2009    Method used:   Electronically to        [facility]* (mail-order)             ,          Ph: [phone]       Fax: [phone]   RxID:   [id number] QVAR 80 MCG/ACT AERS (BECLOMETHASONE DIPROPIONATE) 2 inhalations two times a day  #3 x 3   Entered and Authorized by:   Newt Lukes MD   Signed by:   Newt Lukes MD on 06/19/2009   Method used:   Electronically to        [facility]* (mail-order)             ,          Ph: [phone]       Fax: [phone]   RxID:   [id number] PROVENTIL HFA 108 (90 BASE) MCG/ACT AERS (ALBUTEROL SULFATE) 2 inhalations every 4 hours as needed for shortness of breath or wheeze  #3 x 3   Entered and Authorized by:   Newt Lukes MD   Signed by:   Newt Lukes MD on 06/19/2009   Method used:   Electronically to        [facility]* (mail-order)             ,          Ph: [phone]       Fax: [phone]   RxID:   [id number] PROVENTIL HFA 108 (90 BASE) MCG/ACT AERS (ALBUTEROL SULFATE) 2 inhalations every  4 hours as needed for shortness of breath or wheeze  #1 x 1   Entered and Authorized by:   Newt Lukes MD   Signed by:   Newt Lukes MD on 06/19/2009   Method used:   Electronically to        [facility]  [phone]* (retail)       [address]       [place], [place]  [address]       Ph: [phone] or [phone]       Fax: [phone]   RxID:   [id number] EFFEXOR XR 75 MG XR24H-CAP (VENLAFAXINE HCL) 1 by mouth once daily  #30 x 1   Entered and Authorized by:   Newt Lukes MD   Signed by:   Newt Lukes MD on 06/19/2009   Method used:   Electronically to        [facility]  [phone]* (retail)       [address]       [place], [place]  [address]       Ph: [phone] or [phone]       Fax: [phone]   RxID:   [id number] QVAR 80 MCG/ACT AERS (BECLOMETHASONE DIPROPIONATE) 2  inhalations two times a day  #1 x 1   Entered and Authorized by:   Newt Lukes MD   Signed by:   Newt Lukes MD on 06/19/2009   Method used:   Electronically to        [facility]  [phone]* (retail)       [address]       [place], [place]  [address]       Ph: [phone] or [phone]       Fax: [phone]   RxID:   [id number] LORATADINE 10 MG TABS (LORATADINE) 1 by mouth once daily  #30 x 1   Entered and Authorized by:   Newt Lukes MD   Signed by:   Newt Lukes MD on 06/19/2009   Method used:   Electronically to        [facility]  [phone]* (retail)       [address]       [place], [place]  [address]       Ph: [phone] or [phone]       Fax: [phone]   RxID:   [id number]    Flu Vaccine Consent Questions     Do you have a history of severe allergic reactions to this vaccine? no    Any prior history of allergic reactions to egg and/or gelatin? no    Do you have a sensitivity to the preservative Thimersol? no    Do you have a past history of Guillan-Barre Syndrome? no    Do you currently have an acute febrile illness? no    Have you ever had a severe reaction to latex? no    Vaccine information given and explained to patient? yes    Are you currently pregnant? no    Lot Number:[id number]   Exp Date:11/20/2009   Site Given  Left Deltoid IMbflu

## 2010-06-25 NOTE — Progress Notes (Signed)
Summary: RX REFILL  Phone Note Call from Patient Call back at Home Phone [phone]   Caller: PT Reason for Call: Refill Medication Summary of Call: METOPROLOL 25MG  2 DAILY NEEDS CALLED IN TO [facility] [place] [phone] Initial call taken by: Faythe Ghee,  June 19, 2010 9:35 AM    New/Updated Medications: METOPROLOL TARTRATE 25 MG TABS (METOPROLOL TARTRATE) Take one  tablet by mouth twice a day Prescriptions: METOPROLOL TARTRATE 25 MG TABS (METOPROLOL TARTRATE) Take one  tablet by mouth twice a day  #60 x 3   Entered by:   Larita Fife Via LPN   Authorized by:   Loreli Slot, MD, [facility]   Signed by:   Larita Fife Via LPN on 30/86/5784   Method used:   Electronically to        [facility] [phone]* (retail)       [address]       [place], [place]  [address]       Ph: [phone]       Fax: [phone]   RxID:   [id number]

## 2010-07-02 ENCOUNTER — Ambulatory Visit (INDEPENDENT_AMBULATORY_CARE_PROVIDER_SITE_OTHER): Payer: BC Managed Care – PPO | Admitting: Cardiology

## 2010-07-02 ENCOUNTER — Encounter: Payer: Self-pay | Admitting: Cardiology

## 2010-07-02 DIAGNOSIS — I10 Essential (primary) hypertension: Secondary | ICD-10-CM

## 2010-07-02 DIAGNOSIS — I4949 Other premature depolarization: Secondary | ICD-10-CM

## 2010-07-02 DIAGNOSIS — E782 Mixed hyperlipidemia: Secondary | ICD-10-CM

## 2010-07-02 DIAGNOSIS — I359 Nonrheumatic aortic valve disorder, unspecified: Secondary | ICD-10-CM

## 2010-07-03 ENCOUNTER — Encounter: Payer: Self-pay | Admitting: Cardiology

## 2010-07-09 NOTE — Assessment & Plan Note (Signed)
Summary: 6 mth f/u per checkout on 12/25/09/tg   Visit Type:  Follow-up Primary Provider:  Dr. Newt Lukes   History of Present Illness: 60 year old male presents for followup. He states that he has been feeling well, no exertional chest pain, dizziness, or syncope. He reports exercising at the [facility] 6 days a week, using weights and also aerobic exercise.  Reports compliance with medications. States that he's been trying to lose some weight, also eating fish regularly. He has not yet had a followup physical with lab work. We did discuss his lipids, consideration of trying a different statin medication if his LDL remains above 100.  Reports no palpitations, seemingly PVCs have been much less of an issue. ECG is reviewed below.  Current Medications (verified): 1)  Accupril 20 Mg Tabs (Quinapril Hcl) .Marland Kitchen.. 1 By Mouth Once Daily 2)  Proventil Hfa 108 (90 Base) Mcg/act Aers (Albuterol Sulfate) .... 2 Inhalations Every 4 Hours As Needed For Shortness of Breath or Wheeze 3)  Metoprolol Tartrate 25 Mg Tabs (Metoprolol Tartrate) .... Take One  Tablet By Mouth Twice A Day 4)  Aspir-Low 81 Mg Tbec (Aspirin) .... Take 1 Tab Daily 5)  Qvar 80 Mcg/act Aers (Beclomethasone Dipropionate) .... 2 Inhalations Two Times A Day 6)  Effexor Xr 75 Mg Xr24h-Cap (Venlafaxine Hcl) .Marland Kitchen.. 1 By Mouth Once Daily  Allergies (verified): 1)  * Dust  Comments:  Nurse/Medical Assistant: no meds no list [facility] [place]  Past History:  Social History: Last updated: 11/13/2009 Divorced, lives alone but has girlfriend Current Smoker - 1.5 ppd  Alcohol use - yes-4 drinks daily, sometimes up to 6--beer/wine Drug use - no  Past Medical History: PVC's Skin cancer, melanoma 1993 Anxiety Asthma Hyperlipidemia Hypertension Arthritis CAD - minimal atherosclerosis, LVEF 60% Aortic Stenosis - mild to moderate Bicuspid aortic valve  Review of Systems  The patient denies anorexia, fever, weight gain,  chest pain, syncope, dyspnea on exertion, peripheral edema, melena, and hematochezia.         Otherwise reviewed and negative except as outlined.  Vital Signs:  Patient profile:   60 year old male Weight:      251 pounds BMI:     32.34 Pulse rate:   68 / minute BP sitting:   124 / 80  (left arm)  Vitals Entered By: Dreama Saa, CNA (July 02, 2010 8:31 AM)  Physical Exam  Additional Exam:  Patient comfortable in no acute distress. HEENT: Conjunctiva and lids normal, oropharynx clear. Neck: Supple, no carotid bruits or thyromegaly. Lungs: Clear to auscultation. Cardiac: Regular rate and rhythm, 2/6 systolic murmur at the base, second heart sound preserved, no S3 gallop. Abdomen: Soft, nontender. Extremity: No pitting edema. Skin: Warm and dry. Musculoskeletal: No kyphosis. Neuropsychiatric: Alert and oriented x3, affect appropriate.   EKG  Procedure date:  07/02/2010  Findings:      Sinus rhythm at 63 beats per minute with small R primes in leads V1 and V2.  Prior Report Reviewed for Echocardiogram:  Findings: 11/27/2009 Study Conclusions            - Left ventricle: The cavity size was normal. Wall thickness was       increased in a pattern of mild LVH. Systolic function was normal.       The estimated ejection fraction was in the range of 55% to 60%.       Wall motion was normal; there were no regional wall motion  abnormalities. Doppler parameters are consistent with abnormal       left ventricular relaxation (grade 1 diastolic dysfunction).     - Aortic valve: Mildly calcified annulus. A bicuspid morphology       cannot be excluded; moderately calcified leaflets. Cusp separation       was reduced. There was mild stenosis. Trivial regurgitation. Mean       gradient: 15mm Hg (S). Valve area: 1.31cm 2(VTI). Valve area:       1.19cm 2 (Vmax).     - Mitral valve: Trivial regurgitation.     - Tricuspid valve: Trivial regurgitation.     - Pericardium,  extracardiac: There was no pericardial effusion.  Comments:    Impression & Recommendations:  Problem # 1:  AORTIC STENOSIS (ICD-424.1)  Symptomatically stable, mild to moderate with bicuspid aortic valve. Following clinically at this point. An annual visit will be scheduled.  His updated medication list for this problem includes:    Accupril 20 Mg Tabs (Quinapril hcl) .Marland Kitchen... 1 by mouth once daily    Metoprolol Tartrate 25 Mg Tabs (Metoprolol tartrate) .Marland Kitchen... Take one  tablet by mouth twice a day  Problem # 2:  PREMATURE VENTRICULAR CONTRACTIONS, FREQUENT (ICD-427.69)  This has settled down nicely with combination of exercise and low-dose beta blocker.  His updated medication list for this problem includes:    Accupril 20 Mg Tabs (Quinapril hcl) .Marland Kitchen... 1 by mouth once daily    Metoprolol Tartrate 25 Mg Tabs (Metoprolol tartrate) .Marland Kitchen... Take one  tablet by mouth twice a day    Aspir-low 81 Mg Tbec (Aspirin) .Marland Kitchen... Take 1 tab daily  Problem # 3:  HYPERTENSION (ICD-401.9)  Blood pressure well controlled today.  His updated medication list for this problem includes:    Accupril 20 Mg Tabs (Quinapril hcl) .Marland Kitchen... 1 by mouth once daily    Metoprolol Tartrate 25 Mg Tabs (Metoprolol tartrate) .Marland Kitchen... Take one  tablet by mouth twice a day    Aspir-low 81 Mg Tbec (Aspirin) .Marland Kitchen... Take 1 tab daily  Problem # 4:  HYPERLIPIDEMIA (ICD-272.4)  Due for followup physical with primary care. If his LDL remains over 100, would suggest trial of a different statin medication. He did not tolerate simvastatin previously.  Patient Instructions: 1)  Your physician recommends that you schedule a follow-up appointment in: 1 year 2)  Your physician recommends that you continue on your current medications as directed. Please refer to the Current Medication list given to you today.

## 2010-08-12 ENCOUNTER — Other Ambulatory Visit: Payer: Self-pay | Admitting: *Deleted

## 2010-08-12 MED ORDER — QUINAPRIL HCL 20 MG PO TABS: 20.0000 mg | ORAL_TABLET | Freq: Every day | ORAL | Status: DC

## 2010-08-12 NOTE — Telephone Encounter (Signed)
Pharmacy was'nt in the data base look up [facility] [address] [place] [address] fax to [phone].Marland KitchenMarland Kitchen3/21/12@3 :50pm/LMB

## 2010-08-31 LAB — POCT CARDIAC MARKERS
CKMB, poc: <1 ng/mL — ABNORMAL LOW (ref 1.0–8.0)
Troponin i, poc: <0.05 ng/mL (ref 0.00–0.09)

## 2010-08-31 LAB — BASIC METABOLIC PANEL
BUN: 13 mg/dL (ref 6–23)
BUN: 8 mg/dL (ref 6–23)
CO2: 27 mEq/L (ref 19–32)
Calcium: 8.8 mg/dL (ref 8.4–10.5)
Creatinine, Ser: 0.77 mg/dL (ref 0.4–1.5)
Creatinine, Ser: 0.92 mg/dL (ref 0.4–1.5)
GFR calc non Af Amer: >60 mL/min (ref >60)
GFR calc non Af Amer: >60 mL/min (ref >60)
Glucose, Bld: 107 mg/dL — ABNORMAL HIGH (ref 70–99)
Glucose, Bld: 110 mg/dL — ABNORMAL HIGH (ref 70–99)
Potassium: 3.7 mEq/L (ref 3.5–5.1)
Sodium: 139 mEq/L (ref 135–145)

## 2010-08-31 LAB — DIFFERENTIAL
Basophils Absolute: 0 10*3/uL (ref 0.0–0.1)
Basophils Absolute: 0 10*3/uL (ref 0.0–0.1)
Basophils Relative: 1 % (ref 0–1)
Eosinophils Absolute: 0.2 10*3/uL (ref 0.0–0.7)
Eosinophils Absolute: 0.4 10*3/uL (ref 0.0–0.7)
Eosinophils Relative: 3 % (ref 0–5)
Lymphocytes Relative: 29 % (ref 12–46)
Lymphocytes Relative: 38 % (ref 12–46)
Lymphs Abs: 1.3 10*3/uL (ref 0.7–4.0)
Lymphs Abs: 2.1 10*3/uL (ref 0.7–4.0)
Monocytes Relative: 11 % (ref 3–12)
Neutro Abs: 2.4 10*3/uL (ref 1.7–7.7)
Neutro Abs: 3.9 10*3/uL (ref 1.7–7.7)
Neutrophils Relative %: 43 % (ref 43–77)
Neutrophils Relative %: 54 % (ref 43–77)
Neutrophils Relative %: 67 % (ref 43–77)

## 2010-08-31 LAB — COMPREHENSIVE METABOLIC PANEL
BUN: 12 mg/dL (ref 6–23)
CO2: 31 mEq/L (ref 19–32)
Calcium: 9.2 mg/dL (ref 8.4–10.5)
Creatinine, Ser: 0.94 mg/dL (ref 0.4–1.5)
GFR calc non Af Amer: >60 mL/min (ref >60)
Glucose, Bld: 99 mg/dL (ref 70–99)
Sodium: 141 mEq/L (ref 135–145)
Total Protein: 5.8 g/dL — ABNORMAL LOW (ref 6.0–8.3)

## 2010-08-31 LAB — CBC
HCT: 43.8 % (ref 39.0–52.0)
HCT: 44.2 % (ref 39.0–52.0)
Hemoglobin: 15.8 g/dL (ref 13.0–17.0)
MCHC: 35.6 g/dL (ref 30.0–36.0)
MCHC: 36.2 g/dL — ABNORMAL HIGH (ref 30.0–36.0)
MCV: 98.2 fL (ref 78.0–100.0)
MCV: 99 fL (ref 78.0–100.0)
Platelets: 218 10*3/uL (ref 150–400)
Platelets: 222 10*3/uL (ref 150–400)
Platelets: 226 10*3/uL (ref 150–400)
RBC: 4.5 MIL/uL (ref 4.22–5.81)
RDW: 12.4 % (ref 11.5–15.5)
RDW: 12.7 % (ref 11.5–15.5)
RDW: 13 % (ref 11.5–15.5)
WBC: 5.6 10*3/uL (ref 4.0–10.5)
WBC: 6.4 10*3/uL (ref 4.0–10.5)

## 2010-08-31 LAB — URINALYSIS, ROUTINE W REFLEX MICROSCOPIC
Bilirubin Urine: NEGATIVE
Glucose, UA: NEGATIVE mg/dL
Hgb urine dipstick: NEGATIVE
Ketones, ur: NEGATIVE mg/dL
Specific Gravity, Urine: 1.01 (ref 1.005–1.030)
pH: 7 (ref 5.0–8.0)

## 2010-08-31 LAB — D-DIMER, QUANTITATIVE: D-Dimer, Quant: 0.22 ug/mL-FEU (ref 0.00–0.48)

## 2010-08-31 LAB — LIPID PANEL
HDL: 32 mg/dL — ABNORMAL LOW (ref >39)
LDL Cholesterol: 142 mg/dL — ABNORMAL HIGH (ref 0–99)
Total CHOL/HDL Ratio: 6 RATIO
Triglycerides: 89 mg/dL (ref <150)
VLDL: 18 mg/dL (ref 0–40)

## 2010-08-31 LAB — TSH: TSH: 1.557 u[IU]/mL (ref 0.350–4.500)

## 2010-08-31 LAB — VITAMIN B12: Vitamin B-12: 204 pg/mL — ABNORMAL LOW (ref 211–911)

## 2010-09-28 ENCOUNTER — Telehealth: Payer: Self-pay

## 2010-09-28 MED ORDER — VENLAFAXINE HCL ER 75 MG PO CP24: 75.0000 mg | ORAL_CAPSULE | Freq: Every day | ORAL | Status: DC

## 2010-09-28 NOTE — Telephone Encounter (Signed)
Pt called requesting refill of Effexor ER 75mg  to local pharmacy while waiting for mail order ([facility] to send shipment.

## 2010-10-01 ENCOUNTER — Other Ambulatory Visit: Payer: Self-pay | Admitting: *Deleted

## 2010-10-01 MED ORDER — QUINAPRIL HCL 20 MG PO TABS: 20.0000 mg | ORAL_TABLET | Freq: Every day | ORAL | Status: DC
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
## 2010-10-06 NOTE — Cardiovascular Report (Signed)
NAME:  Danny Johnson, Danny Johnson NO.:  [id number]   MEDICAL RECORD NO.:  [id number]          PATIENT TYPE:  OIB   LOCATION:  1963                         FACILITY:  [facility]   PHYSICIAN:  Rollene Rotunda, MD, FACCDATE OF BIRTH:  1950/07/17   DATE OF PROCEDURE:  11/08/2008  DATE OF DISCHARGE:  11/08/2008                            CARDIAC CATHETERIZATION   PRIMARY CARE PHYSICIAN:  Erle Crocker, MD   CARDIOLOGIST:  Jesse Sans. Wall, MD, [facility]   PROCEDURE:  Left heart catheterization/coronary arteriography.   INDICATIONS:  Evaluate the patient with chest pain suggestive of  unstable angina.  He also had a Cardiolite demonstrating an EF of 45%.  There was fixed inferior wall defect without large areas of ischemia.  He had an episode of nonsustained ventricular tachycardia on telemetry.  He has mild-to-moderate aortic stenosis by echo with possible bicuspid  aortic valve.   PROCEDURE NOTE:  Left heart catheterization performed via right femoral  artery.  The artery was cannulated using anterior wall puncture.  A #4-  [place] arterial sheath was inserted via the modified Seldinger  technique.  Preformed Judkins and pigtail catheter were utilized.  The  patient tolerated the procedure well and left the lab in stable  condition.   RESULTS:  Hemodynamics; LV 127/8, AO 122/94.  Coronaries; left main was normal.  The LAD had ostial and proximal  luminal irregularities.  First and second diagonal were tiny and normal.  Second diagonal was small and normal.  The circumflex in the AV groove  was normal.  There was mid obtuse marginal, which was large branching  and normal.  The right coronary artery was large and dominant.  The PDA  was moderate sized and normal.  Left ventriculogram; the left ventriculogram was obtained in the RAO  projection with EF of 60% and normal.   CONCLUSION:  Minimal coronary artery plaque.  Normal left ventricular  function.   PLAN:  The patient will have  primary risk reduction.  He will continue  to have follow up of his aortic stenosis, which was not significant on  this study.  No further cardiovascular testing is suggested at this  time.      Rollene Rotunda, MD, [facility]  Electronically Signed     JH/MEDQ  D:  11/08/2008  T:  11/09/2008  Job:  [id number]   cc:   Erle Crocker, M.D.  Thomas C. Wall, MD, [facility]
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
## 2010-10-06 NOTE — Assessment & Plan Note (Signed)
[facility]                       [place] [facility] OFFICE NOTE   Danny Johnson, Danny Johnson                        MRN:          [id number]  DATE:11/22/2008                            DOB:          Feb 05, 1951    PRIMARY CARE PHYSICIAN:  Erle Crocker, MD   REASON FOR VISIT:  Followup cardiac catheterization.   HISTORY OF PRESENT ILLNESS:  Danny Johnson was seen most recently in the  office by Danny [place] back on June 15.  He has a history of near-syncope  noted earlier in June with subsequent evaluation demonstrating a left  ventricular ejection fraction of 55-60% by echocardiography, associated  with a possible bicuspid aortic valve, and mild-to-moderate aortic  stenosis with a mean transvalvular gradient of 23 mmHg.  He had a  subsequent CardioNet monitor that demonstrated an asymptomatic 13 beat  run of ventricular tachycardia.  Followup ischemic evaluation via  Myoview suggested an ejection fraction of 45% with diaphragmatic  attenuation and a fixed inferior defect without obvious ischemia and  normal t.i.d. ratio.  He described no recurrent near-syncope or syncope,  but was having some mild palpitations and was referred therefore in  light of all these factors for a diagnostic cardiac catheterization to  clearly exclude underlying obstructive coronary artery disease.  This  study was performed by Dr. Antoine Poche on June 18, fortunately demonstrated  only minimal coronary atherosclerotic plaque with an ejection fraction  of 60%.  I reviewed this with the patient today and reassured him at  this point.  He notes occasional palpitations, mainly with emotional  stress.  He reports he lost his job.  He now lives in [place].  He is  tolerating low-dose Lopressor and we discussed increasing it to 25 mg  twice a day.  Otherwise, he has had no dizziness or syncope.  He reports  no exertional chest pain.   ALLERGIES:  No known drug allergies.   PRESENT  MEDICATIONS:  1. Aspirin 81 mg p.o. daily.  2. Effexor XR 75 mg p.o. daily.  3. Simvastatin 40 mg p.o. nightly.  4. Quinapril 20 mg p.o. daily.  5. Singulair 10 mg p.o. daily.  6. Lopressor 12.5 mg p.o. b.i.d.   REVIEW OF SYSTEMS:  As outlined above.  No problems with groin site,  status post catheterization.  Otherwise systems reviewed and negative.   PHYSICAL EXAMINATION:  VITAL SIGNS:  Blood pressure is 127/79, heart  rate 82 and regular.  GENERAL:  The patient is comfortable in no acute distress.  HEENT:  Conjunctiva is normal.  Oropharynx is clear.  NECK:  Supple with no elevated jugular venous pressure, loud bruits, or  thyromegaly.  LUNGS:  Clear without labored breathing at rest.  Diminished breath  sounds noted.  No wheezing noted.  CARDIAC:  A 2-3/6 systolic murmur heard best at the base, preserved  second heart sound noted.  No pericardial rub or S3 gallop.  ABDOMEN:  Soft, nontender.  Normoactive bowel sounds.  EXTREMITIES:  Exhibit no significant pitting edema.   IMPRESSION AND RECOMMENDATIONS:  1. History of presyncope, etiology not entirely clear.  The patient  did have documentation of nonsustained ventricular tachycardia on a      subsequent CardioNet monitor, although this was asymptomatic, and      evaluation indicates normal left ventricular systolic function with      only minimal coronary atherosclerosis by catheterization.  At this      point, plan will be for continuing observation and medical therapy,      with an increase in Lopressor to 25 mg twice daily.  I will plan to      see him back over the next 6 months, sooner if symptoms      worsen/recur.  He is reporting no problems with dizziness or      syncope.  2. Mild-to-moderate aortic stenosis with possible bicuspid aortic      valve.  We will plan followup clinically with a repeat      echocardiogram over the next 1-2 years.     Jonelle Sidle, MD  Electronically Signed    SGM/MedQ   DD: 11/22/2008  DT: 11/23/2008  Job #: [id number]   cc:   Erle Crocker, M.D.

## 2010-10-30 ENCOUNTER — Other Ambulatory Visit: Payer: Self-pay | Admitting: *Deleted

## 2010-10-30 MED ORDER — METOPROLOL TARTRATE 25 MG PO TABS: 25.0000 mg | ORAL_TABLET | Freq: Two times a day (BID) | ORAL | Status: DC

## 2010-11-19 ENCOUNTER — Other Ambulatory Visit: Payer: Self-pay | Admitting: *Deleted

## 2010-11-19 MED ORDER — VENLAFAXINE HCL ER 75 MG PO CP24: 75.0000 mg | ORAL_CAPSULE | Freq: Every day | ORAL | Status: DC

## 2010-11-26 ENCOUNTER — Other Ambulatory Visit: Payer: Self-pay | Admitting: *Deleted

## 2010-11-26 MED ORDER — VENLAFAXINE HCL ER 75 MG PO CP24: 75.0000 mg | ORAL_CAPSULE | Freq: Every day | ORAL | Status: DC

## 2011-02-09 ENCOUNTER — Other Ambulatory Visit: Payer: Self-pay | Admitting: *Deleted

## 2011-02-09 MED ORDER — QUINAPRIL HCL 20 MG PO TABS: 20.0000 mg | ORAL_TABLET | Freq: Every day | ORAL | Status: DC

## 2011-05-14 ENCOUNTER — Encounter: Payer: Self-pay | Admitting: Cardiology

## 2011-06-18 ENCOUNTER — Ambulatory Visit (INDEPENDENT_AMBULATORY_CARE_PROVIDER_SITE_OTHER): Payer: BC Managed Care – PPO | Admitting: Internal Medicine

## 2011-06-18 ENCOUNTER — Encounter: Payer: Self-pay | Admitting: Internal Medicine

## 2011-06-18 ENCOUNTER — Other Ambulatory Visit (INDEPENDENT_AMBULATORY_CARE_PROVIDER_SITE_OTHER): Payer: BC Managed Care – PPO

## 2011-06-18 VITALS — BP 110/83 | HR 70 | Temp 98.8°F | Ht 75.0 in | Wt 251.8 lb

## 2011-06-18 DIAGNOSIS — Z23 Encounter for immunization: Secondary | ICD-10-CM

## 2011-06-18 DIAGNOSIS — J45909 Unspecified asthma, uncomplicated: Secondary | ICD-10-CM

## 2011-06-18 DIAGNOSIS — Z0000 Encounter for general adult medical examination without abnormal findings: Secondary | ICD-10-CM

## 2011-06-18 DIAGNOSIS — I10 Essential (primary) hypertension: Secondary | ICD-10-CM

## 2011-06-18 DIAGNOSIS — Z1211 Encounter for screening for malignant neoplasm of colon: Secondary | ICD-10-CM

## 2011-06-18 DIAGNOSIS — F17200 Nicotine dependence, unspecified, uncomplicated: Secondary | ICD-10-CM

## 2011-06-18 LAB — PSA: PSA: 0.36 ng/mL (ref 0.10–4.00)

## 2011-06-18 LAB — LDL CHOLESTEROL, DIRECT: Direct LDL: 163 mg/dL

## 2011-06-18 LAB — URINALYSIS
Leukocytes, UA: NEGATIVE
Nitrite: NEGATIVE
Specific Gravity, Urine: >=1.03 (ref 1.000–1.030)
Total Protein, Urine: NEGATIVE
pH: 5.5 (ref 5.0–8.0)

## 2011-06-18 LAB — CBC WITH DIFFERENTIAL/PLATELET
Basophils Absolute: 0.1 10*3/uL (ref 0.0–0.1)
Eosinophils Relative: 8.5 % — ABNORMAL HIGH (ref 0.0–5.0)
Hemoglobin: 15.9 g/dL (ref 13.0–17.0)
Lymphocytes Relative: 21.2 % (ref 12.0–46.0)
Monocytes Relative: 9.7 % (ref 3.0–12.0)
Platelets: 243 10*3/uL (ref 150.0–400.0)
RDW: 13 % (ref 11.5–14.6)
WBC: 6.2 10*3/uL (ref 4.5–10.5)

## 2011-06-18 LAB — BASIC METABOLIC PANEL
Calcium: 9 mg/dL (ref 8.4–10.5)
GFR: 99.64 mL/min (ref >60.00)
Glucose, Bld: 94 mg/dL (ref 70–99)
Potassium: 4.3 mEq/L (ref 3.5–5.1)
Sodium: 140 mEq/L (ref 135–145)

## 2011-06-18 LAB — LIPID PANEL
Cholesterol: 219 mg/dL — ABNORMAL HIGH (ref 0–200)
HDL: 44.2 mg/dL (ref >39.00)
Triglycerides: 92 mg/dL (ref 0.0–149.0)

## 2011-06-18 LAB — HEPATIC FUNCTION PANEL
ALT: 26 U/L (ref 0–53)
AST: 26 U/L (ref 0–37)
Total Bilirubin: 0.6 mg/dL (ref 0.3–1.2)
Total Protein: 6.9 g/dL (ref 6.0–8.3)

## 2011-06-18 LAB — TSH: TSH: 1.23 u[IU]/mL (ref 0.35–5.50)

## 2011-06-18 MED ORDER — BUPROPION HCL ER (SMOKING DET) 150 MG PO TB12: 150.0000 mg | ORAL_TABLET | Freq: Two times a day (BID) | ORAL | Status: AC

## 2011-06-18 NOTE — Assessment & Plan Note (Signed)
BP Readings from Last 3 Encounters:  06/18/11 110/83  07/02/10 124/80  12/25/09 126/85   The current medical regimen is effective;  continue present plan and medications.

## 2011-06-18 NOTE — Progress Notes (Signed)
Subjective:    Patient ID: Danny Johnson, male    DOB: 01/12/1951, 61 y.o.   MRN: [id number]  HPI patient is here today for annual physical. Patient feels well overall.     also reviewed other chronic issues: asthma - worse during cold months not using steroid inhaler and rare use of rescue inhaler also worse in the house due to allergens -dust continues to smoke - but ready to quit - prior chantix ineffective, question other options   allergies - +nasal drainage and eye water/itch - wosre with cat, dust in house setting never on shots, uses OTC meds for same   HTN -reports compliance with ongoing medical treatment and no changes in medication dose or frequency. denies adverse side effects related to current therapy. no CP or SOB or edema, no HA   dyslipidemia -self directed discontinuation of prior statin treatment due to myalgias - symptoms improved off tx - wishes not to be on any med tx for this   anxiety -takes generic Effexor for same.reports compliance with ongoing medical treatment and no changes in medication dose or frequency. denies adverse side effects related to current therapy. no panic symptoms or exac    Past Medical History  Diagnosis Date  . PVC's (premature ventricular contractions)   . Skin cancer (melanoma) 1993  . Anxiety   . Asthma   . Hyperlipidemia   . Hypertension   . Arthritis   . CAD (coronary artery disease)   . Aortic stenosis   . Bicuspid aortic valve    Family History  Problem Relation Age of Onset  . Dementia Mother    History  Substance Use Topics  . Smoking status: Current Everyday Smoker -- 1.5 packs/day  . Smokeless tobacco: Not on file  . Alcohol Use: 21.0 oz/week    42 drink(s) per week     4-6 drinks daily, beer/wine    Review of Systems Constitutional: Negative for fever or weight change.  Respiratory: Negative for cough and shortness of breath.   Cardiovascular: Negative for chest pain or palpitations.  Gastrointestinal:  Negative for abdominal pain, no bowel changes.  Musculoskeletal: Negative for gait problem or joint swelling.  Skin: Negative for rash.  Neurological: Negative for dizziness or headache.  No other specific complaints in a complete review of systems (except as listed in HPI above).     Objective:   Physical Exam BP 110/83  Pulse 70  Temp(Src) 98.8 F (37.1 C) (Oral)  Ht 6\' 3"  (1.905 m)  Wt 251 lb 12.8 oz (114.216 kg)  BMI 31.47 kg/m2  SpO2 95% Wt Readings from Last 3 Encounters:  06/18/11 251 lb 12.8 oz (114.216 kg)  07/02/10 251 lb (113.853 kg)  12/25/09 231 lb (104.781 kg)   Constitutional:  He appears well-developed and well-nourished. No distress.  HENT: Normocephalic atraumatic, sinuses nontender, oropharynx without erythema or exudate. No postnasal drip. Teeth in air condition Eyes: PERRL, EOMI. No conjunctivitis or icterus. Uncorrected vision grossly intact Neck: Normal range of motion. Neck supple. No JVD present. No thyromegaly present.  Cardiovascular: Normal rate, regular rhythm and normal heart sounds.  Faint 2/6 systolic murmur heard. no BLE edema Pulmonary/Chest: Effort normal and breath sounds normal. No respiratory distress. no wheezes.  Abdominal: Soft. Bowel sounds are normal. Patient exhibits no distension. There is no tenderness.  Musculoskeletal: Normal range of motion. Patient exhibits no edema.  Neurological: he is alert and oriented to person, place, and time. No cranial nerve deficit. Coordination normal.  Skin:  Skin is warm and dry.  No erythema or ulceration.  Psychiatric: he has a minimally anxious mood and affect. behavior is normal. Judgment and thought content normal.   Lab Results  Component Value Date   WBC 5.2 11/07/2009   HGB 16.5 11/07/2009   HCT 46.8 11/07/2009   PLT 240.0 11/07/2009   GLUCOSE 106* 11/07/2009   CHOL 221* 11/07/2009   TRIG 80.0 11/07/2009   HDL 53.70 11/07/2009   LDLDIRECT 150.1 11/07/2009   LDLCALC  Value: 142        Total  Cholesterol/HDL:CHD Risk Coronary Heart Disease Risk Table                     Men   Women  1/2 Average Risk   3.4   3.3  Average Risk       5.0   4.4  2 X Average Risk   9.6   7.1  3 X Average Risk  23.4   11.0        Use the calculated Patient Ratio above and the CHD Risk Table to determine the patient's CHD Risk.        ATP III CLASSIFICATION (LDL):  <100     mg/dL   Optimal  161-096  mg/dL   Near or Above                    Optimal  130-159  mg/dL   Borderline  045-409  mg/dL   High  >811     mg/dL   Very High* 01/22/4781   ALT 26 11/07/2009   AST 24 11/07/2009   NA 142 11/07/2009   K 4.8 11/07/2009   CL 107 11/07/2009   CREATININE 0.8 11/07/2009   BUN 15 11/07/2009   CO2 30 11/07/2009   TSH 1.30 11/07/2009   PSA 0.93 11/07/2009   EKG: NSR @ 68 bpm - no ischemic changes, no arrythmia     Assessment & Plan:  CPX - v70.0 - Patient has been counseled on age-appropriate routine health concerns for screening and prevention. These are reviewed and up-to-date. Immunizations are up-to-date or declined. Labs ordered and ECG reviewed.   Tobacco abuse - 5 minutes today spent counseling patient on unhealthy effects of continued tobacco abuse and encouragement of cessation including medical options available to help the patient quit smoking. Prior Chantix ineffective, will start Wellbutrin trial at this time

## 2011-06-18 NOTE — Assessment & Plan Note (Signed)
Noncompliant with inhaled steroid. Removed from list as no exacerbation. Uses rescue inhaler less than one time per month Smoking cessation discussed as above Regular exercise aerobic on treadmill without dyspnea or wheezing symptoms

## 2011-06-18 NOTE — Patient Instructions (Signed)
It was good to see you today. Medications reviewed, Qvar removed from list as no longer needed for asthma. Start Zyban for smoking cessation take one tablet daily for 3 days then increase to twice a day for 2-3 months. Take last dose no later than 6 PM and separate doses by at least 8 hours. You should stop smoking on day 5-7 of treatment. Your prescription(s) have been submitted to your pharmacy. Please take as directed and contact our office if you believe you are having problem(s) with the medication(s). we'll make referral to gastroenterology in [place] or [place] for your screening colonoscopy . Our office will contact you regarding appointment(s) once made. Health Maintenance reviewed - tetanus booster given. Screening colonoscopy as above. Other recommendations are up-to-date Test(s) ordered today. Your results will be called to you after review (48-72hours after test completion). If any changes need to be made, you will be notified at that time. Please schedule followup annually for complete physical and labs, call sooner if problems.

## 2011-07-05 ENCOUNTER — Other Ambulatory Visit: Payer: Self-pay | Admitting: *Deleted

## 2011-07-05 MED ORDER — QUINAPRIL HCL 20 MG PO TABS: 20.0000 mg | ORAL_TABLET | Freq: Every day | ORAL | Status: DC

## 2011-07-09 ENCOUNTER — Ambulatory Visit (INDEPENDENT_AMBULATORY_CARE_PROVIDER_SITE_OTHER): Payer: BC Managed Care – PPO | Admitting: Cardiology

## 2011-07-09 ENCOUNTER — Encounter: Payer: Self-pay | Admitting: Cardiology

## 2011-07-09 VITALS — BP 125/80 | HR 58 | Resp 18 | Ht 75.0 in | Wt 249.0 lb

## 2011-07-09 DIAGNOSIS — I10 Essential (primary) hypertension: Secondary | ICD-10-CM

## 2011-07-09 DIAGNOSIS — I359 Nonrheumatic aortic valve disorder, unspecified: Secondary | ICD-10-CM

## 2011-07-09 DIAGNOSIS — F17200 Nicotine dependence, unspecified, uncomplicated: Secondary | ICD-10-CM

## 2011-07-09 DIAGNOSIS — I493 Ventricular premature depolarization: Secondary | ICD-10-CM

## 2011-07-09 DIAGNOSIS — I2510 Atherosclerotic heart disease of native coronary artery without angina pectoris: Secondary | ICD-10-CM

## 2011-07-09 DIAGNOSIS — I4949 Other premature depolarization: Secondary | ICD-10-CM

## 2011-07-09 NOTE — Assessment & Plan Note (Signed)
Patient is working on smoking cessation 

## 2011-07-09 NOTE — Assessment & Plan Note (Signed)
Blood pressure is reasonably well controlled today. 

## 2011-07-09 NOTE — Patient Instructions (Signed)
Your physician recommends that you schedule a follow-up appointment in: 12 months Your physician has requested that you have an echocardiogram. Echocardiography is a painless test that uses sound waves to create images of your heart. It provides your doctor with information about the size and shape of your heart and how well your heart's chambers and valves are working. This procedure takes approximately one hour. There are no restrictions for this procedure.    

## 2011-07-09 NOTE — Assessment & Plan Note (Signed)
Asymptomatic, quiescent on beta blocker.

## 2011-07-09 NOTE — Progress Notes (Signed)
Clinical Summary Danny Johnson is a 61 y.o.male presenting for followup. He was seen in February of last year. Had a recent physical with Dr. Felicity Johnson.  He continues to exercise at his local [employer] in [place] 5 days a week. He reports no exertional chest pain, no palpitations or syncope, has NYHA class 1-2 dyspnea on exertion which is stable.  He states that he is trying to quit smoking, has started on Wellbutrin.  His last echocardiogram from 2011 was reviewed, outlined below.   No Known Allergies  Current Outpatient Prescriptions  Medication Sig Dispense Refill  . albuterol (PROVENTIL HFA;VENTOLIN HFA) 108 (90 BASE) MCG/ACT inhaler Inhale 2 puffs into the lungs every 4 (four) hours as needed.        Marland Kitchen aspirin 81 MG tablet Take 81 mg by mouth daily.        Marland Kitchen buPROPion (ZYBAN) 150 MG 12 hr tablet Take 1 tablet (150 mg total) by mouth 2 (two) times daily.  60 tablet  1  . metoprolol tartrate (LOPRESSOR) 25 MG tablet Take 1 tablet (25 mg total) by mouth 2 (two) times daily.  180 tablet  3  . quinapril (ACCUPRIL) 20 MG tablet Take 1 tablet (20 mg total) by mouth daily.  90 tablet  1  . Specialty Vitamins Products (PROSTATE PO) Take by mouth.      . venlafaxine (EFFEXOR-XR) 75 MG 24 hr capsule Take 1 capsule (75 mg total) by mouth daily.  90 capsule  0    Past Medical History  Diagnosis Date  . PVC's (premature ventricular contractions)   . Melanoma 1993  . Anxiety   . Asthma   . Mixed hyperlipidemia   . Essential hypertension, benign   . Arthritis   . Coronary atherosclerosis of native coronary artery     Minimal, LVEF 60%  . Aortic stenosis     Mild to moderate  . Bicuspid aortic valve     Social History Danny Johnson reports that he has been smoking Cigarettes.  He has been smoking about 1.5 packs per day. He does not have any smokeless tobacco history on file. Danny Johnson reports that he drinks about 21 ounces of alcohol per week.  Review of Systems No fevers or chills, no  cough, no reported bleeding problems. Stable appetite. Otherwise negative except as outlined.  Physical Examination Filed Vitals:   07/09/11 0915  BP: 125/80  Pulse: 58  Resp: 18   Patient comfortable in no acute distress.  HEENT: Conjunctiva and lids normal, oropharynx clear.  Neck: Supple, no carotid bruits or thyromegaly.  Lungs: Clear to auscultation.  Cardiac: Regular rate and rhythm, 2/6 systolic murmur at the base, second heart sound preserved, no S3 gallop.  Abdomen: Soft, nontender.  Extremity: No pitting edema.  Skin: Warm and dry.  Musculoskeletal: No kyphosis.  Neuropsychiatric: Alert and oriented x3, affect appropriate.   ECG Tracing from January reviewed showing normal sinus rhythm.  Studies Echocardiogram 11/27/09 - Left ventricle: The cavity size was normal. Wall thickness was increased in a pattern of mild LVH. Systolic function was normal. The estimated ejection fraction was in the range of 55% to 60%. Wall motion was normal; there were no regional wall motion abnormalities. Doppler parameters are consistent with abnormal left ventricular relaxation (grade 1 diastolic dysfunction). - Aortic valve: Mildly calcified annulus. A bicuspid morphology cannot be excluded; moderately calcified leaflets. Cusp separation was reduced. There was mild stenosis. Trivial regurgitation. Mean gradient: 15mm Hg (S). Valve area: 1.31cm^2(VTI). Valve area:  1.19cm^2 (Vmax). - Mitral valve: Trivial regurgitation. - Tricuspid valve: Trivial regurgitation. - Pericardium, extracardiac: There was no pericardial effusion.    Problem List and Plan

## 2011-07-09 NOTE — Assessment & Plan Note (Signed)
History of minor atherosclerosis, no active angina. Continue exercise, aspirin and beta blocker. He has had difficulty tolerating statins previously.

## 2011-07-09 NOTE — Assessment & Plan Note (Signed)
Clinically stable with bicuspid aortic valve. Last echocardiogram was in 2011. Study will be repeated this year, and otherwise continue annual followup unless there has been significant change.

## 2011-07-13 ENCOUNTER — Ambulatory Visit (HOSPITAL_COMMUNITY)
Admission: RE | Admit: 2011-07-13 | Discharge: 2011-07-13 | Disposition: A | Discharge status: Home / Self Care | Payer: BC Managed Care – PPO | Source: Ambulatory Visit | Attending: Cardiology | Admitting: Cardiology

## 2011-07-13 DIAGNOSIS — F17200 Nicotine dependence, unspecified, uncomplicated: Secondary | ICD-10-CM | POA: Insufficient documentation

## 2011-07-13 DIAGNOSIS — I359 Nonrheumatic aortic valve disorder, unspecified: Secondary | ICD-10-CM

## 2011-07-13 DIAGNOSIS — I10 Essential (primary) hypertension: Secondary | ICD-10-CM | POA: Insufficient documentation

## 2011-07-13 DIAGNOSIS — E785 Hyperlipidemia, unspecified: Secondary | ICD-10-CM | POA: Insufficient documentation

## 2011-07-13 NOTE — Progress Notes (Signed)
*  PRELIMINARY RESULTS* Echocardiogram 2D Echocardiogram has been performed.  Danny Johnson 07/13/2011, 10:07 AM

## 2011-07-15 ENCOUNTER — Encounter: Payer: Self-pay | Admitting: *Deleted

## 2011-07-16 ENCOUNTER — Encounter: Payer: Self-pay | Admitting: Internal Medicine

## 2011-07-16 LAB — HM COLONOSCOPY

## 2011-07-20 ENCOUNTER — Other Ambulatory Visit: Payer: Self-pay | Admitting: *Deleted

## 2011-07-21 ENCOUNTER — Encounter: Payer: Self-pay | Admitting: Internal Medicine

## 2011-07-27 ENCOUNTER — Encounter: Payer: Self-pay | Admitting: Internal Medicine

## 2011-08-02 ENCOUNTER — Other Ambulatory Visit: Payer: Self-pay

## 2011-08-02 MED ORDER — VENLAFAXINE HCL ER 75 MG PO CP24: 75.0000 mg | ORAL_CAPSULE | Freq: Every day | ORAL | Status: DC

## 2011-08-26 ENCOUNTER — Other Ambulatory Visit: Payer: Self-pay | Admitting: *Deleted

## 2011-08-26 MED ORDER — METOPROLOL TARTRATE 25 MG PO TABS: 25.0000 mg | ORAL_TABLET | Freq: Two times a day (BID) | ORAL | Status: DC

## 2011-09-22 ENCOUNTER — Encounter: Payer: Self-pay | Admitting: Cardiology

## 2011-09-22 ENCOUNTER — Ambulatory Visit (INDEPENDENT_AMBULATORY_CARE_PROVIDER_SITE_OTHER): Payer: BC Managed Care – PPO | Admitting: Cardiology

## 2011-09-22 VITALS — BP 142/90 | HR 73 | Resp 16 | Ht 75.0 in | Wt 253.0 lb

## 2011-09-22 DIAGNOSIS — I10 Essential (primary) hypertension: Secondary | ICD-10-CM

## 2011-09-22 DIAGNOSIS — I359 Nonrheumatic aortic valve disorder, unspecified: Secondary | ICD-10-CM

## 2011-09-22 DIAGNOSIS — R002 Palpitations: Secondary | ICD-10-CM | POA: Insufficient documentation

## 2011-09-22 NOTE — Assessment & Plan Note (Signed)
Reviewed above, no associated chest pain or syncope. States he feels better when he exercises. Today we reviewed his prior cardiac testing, and I reinforced with him reduction in caffeine to see if this helps. Could also consider increasing his Lopressor as a next step, although I would recommend pursuing a cardiac monitor if his palpitations persist despite caffeine reduction to ensure that he is not manifesting any arrhythmias. Office followup arranged.

## 2011-09-22 NOTE — Assessment & Plan Note (Signed)
Mild to moderate, stable by recent echocardiogram.

## 2011-09-22 NOTE — Patient Instructions (Signed)
**Note De-identified Danny Johnson Obfuscation** Your physician recommends that you continue on your current medications as directed. Please refer to the Current Medication list given to you today.  Your physician recommends that you schedule a follow-up appointment in: 3 months  

## 2011-09-22 NOTE — Progress Notes (Signed)
   Clinical Summary Danny Johnson is a 61 y.o.male presenting for followup. He was seen in February.  Echocardiogram from 2/13 showed LVEF 55-60% with mld to moderate stable aortic stenosis, mean gradient 15 mmHg.  He presents today stating that he has had some episodes of palpitations, describes a fluttering sensation in his chest. No associated lightheadedness, chest pain, or syncope. With his history of PVCs, he was concerned. Followup ECG is reviewed and stable. We discussed his prior cardiac testing which has been overall reassuring showing no reduction in LVEF and no major degree of CAD. He also wondered whether he was having some muscle fasciculations in his chest.  He does admit to significant caffeine use in the form of coffee, also continues to smoke cigarettes. We discussed reduction in his caffeine and ultimately smoking cessation. He is not having any exertional symptoms, in fact states he feels well when he exercises at the [facility].  No Known Allergies  Current Outpatient Prescriptions  Medication Sig Dispense Refill  . albuterol (PROVENTIL HFA;VENTOLIN HFA) 108 (90 BASE) MCG/ACT inhaler Inhale 2 puffs into the lungs every 4 (four) hours as needed.        Marland Kitchen aspirin 81 MG tablet Take 81 mg by mouth daily.        . metoprolol tartrate (LOPRESSOR) 25 MG tablet Take 1 tablet (25 mg total) by mouth 2 (two) times daily.  180 tablet  3  . quinapril (ACCUPRIL) 20 MG tablet Take 1 tablet (20 mg total) by mouth daily.  90 tablet  1  . Specialty Vitamins Products (PROSTATE PO) Take by mouth.      . venlafaxine (EFFEXOR-XR) 75 MG 24 hr capsule Take 1 capsule (75 mg total) by mouth daily.  90 capsule  3    Past Medical History  Diagnosis Date  . PVC's (premature ventricular contractions)   . Melanoma 1993  . Anxiety   . Asthma   . Mixed hyperlipidemia   . Essential hypertension, benign   . Arthritis   . Coronary atherosclerosis of native coronary artery     Minimal, LVEF 60%  . Aortic  stenosis     Mild to moderate  . Bicuspid aortic valve     Social History Danny Johnson reports that he has been smoking Cigarettes.  He has been smoking about 1.5 packs per day. He does not have any smokeless tobacco history on file. Danny Johnson reports that he drinks about 21 ounces of alcohol per week.  Review of Systems Negative except as outlined.  Physical Examination Filed Vitals:   09/22/11 1452  BP: 142/90  Pulse: 73  Resp: 16    Patient comfortable in no acute distress.  HEENT: Conjunctiva and lids normal, oropharynx clear.  Neck: Supple, no carotid bruits or thyromegaly.  Lungs: Clear to auscultation.  Cardiac: Regular rate and rhythm, 2/6 systolic murmur at the base, second heart sound preserved, no S3 gallop.  Abdomen: Soft, nontender.  Extremity: No pitting edema.    ECG Normal sinus rhythm, small R prime V1.   Problem List and Plan

## 2011-11-16 ENCOUNTER — Other Ambulatory Visit: Payer: Self-pay | Admitting: *Deleted

## 2011-11-16 MED ORDER — ALBUTEROL SULFATE HFA 108 (90 BASE) MCG/ACT IN AERS: 2.0000 | INHALATION_SPRAY | RESPIRATORY_TRACT | Status: DC | PRN

## 2011-11-24 ENCOUNTER — Other Ambulatory Visit: Payer: Self-pay | Admitting: *Deleted

## 2011-11-24 MED ORDER — QUINAPRIL HCL 20 MG PO TABS: 20.0000 mg | ORAL_TABLET | Freq: Every day | ORAL | Status: DC

## 2011-12-03 ENCOUNTER — Telehealth: Payer: Self-pay | Admitting: Cardiology

## 2011-12-03 NOTE — Telephone Encounter (Signed)
PT STATES HE IS VERY TIRED AND WOULD LIKE TO CUT METOPROLOL IN HALF

## 2011-12-03 NOTE — Telephone Encounter (Signed)
Pt. States that since his last OV with Dr. Diona Browner on 5-1 he has stopped caffeine and is now extremely fatigued and weak. He states that his BP this afternoon is 100/70 and he wants to know if he can reduce Metoprolol to 1/2 tablet BID. Please advise./LV

## 2011-12-06 NOTE — Telephone Encounter (Signed)
**Note De-identified Danny Johnson Obfuscation** LMOM./LV 

## 2011-12-06 NOTE — Telephone Encounter (Signed)
Yes  He may decrease metoprolol. Will need follow up BP check in one week post stopping this.,

## 2011-12-06 NOTE — Telephone Encounter (Signed)
**Note De-identified Zo Loudon Obfuscation** Pt. Advised, he verbalized understanding./LV 

## 2011-12-28 ENCOUNTER — Ambulatory Visit: Payer: BC Managed Care – PPO | Admitting: Cardiology

## 2012-01-05 ENCOUNTER — Encounter: Payer: Self-pay | Admitting: Internal Medicine

## 2012-01-05 ENCOUNTER — Ambulatory Visit (INDEPENDENT_AMBULATORY_CARE_PROVIDER_SITE_OTHER): Payer: BC Managed Care – PPO | Admitting: Internal Medicine

## 2012-01-05 VITALS — BP 130/78 | HR 60 | Temp 98.2°F | Ht 75.0 in | Wt 257.4 lb

## 2012-01-05 DIAGNOSIS — R001 Bradycardia, unspecified: Secondary | ICD-10-CM

## 2012-01-05 DIAGNOSIS — I498 Other specified cardiac arrhythmias: Secondary | ICD-10-CM

## 2012-01-05 DIAGNOSIS — I359 Nonrheumatic aortic valve disorder, unspecified: Secondary | ICD-10-CM

## 2012-01-05 DIAGNOSIS — I10 Essential (primary) hypertension: Secondary | ICD-10-CM

## 2012-01-05 DIAGNOSIS — R55 Syncope and collapse: Secondary | ICD-10-CM

## 2012-01-05 MED ORDER — METOPROLOL TARTRATE 25 MG PO TABS: 12.5000 mg | ORAL_TABLET | Freq: Two times a day (BID) | ORAL | Status: DC

## 2012-01-05 NOTE — Assessment & Plan Note (Signed)
BP Readings from Last 3 Encounters:  01/05/12 130/78  09/22/11 142/90  07/09/11 125/80   ?overtx contributing to fatigue and near syncope Reduce beta-blocker, cont ACEI

## 2012-01-05 NOTE — Assessment & Plan Note (Signed)
Stable on 06/2011 Echo Consider repeat eval if recurrent near syncope symptoms despite reduction of beta-blocker - pt will call if probelms

## 2012-01-05 NOTE — Patient Instructions (Signed)
It was good to see you today. Reduce metoprolol by 1/2 as discussed Other Medications reviewed, no changes at this time.  Call if recurrent problems

## 2012-01-05 NOTE — Progress Notes (Signed)
  Subjective:    Patient ID: Danny Johnson, male    DOB: 08/06/1950, 61 y.o.   MRN: [id number]  HPI  complains of near syncope Episode 3 days ago Occurred while driving - able to pull to side and check self [place] nearby firestation Reports glc 73, BP 140s, HR ?50s Feels symptoms related to beta-blocker and low BP/low HR No residual dizziness or recurrence but overwhelming fatigue Also stopped smoking 13 days ago  Past Medical History  Diagnosis Date  . PVC's (premature ventricular contractions)   . Melanoma 1993  . Anxiety   . Asthma   . Mixed hyperlipidemia   . Essential hypertension, benign   . Arthritis   . Coronary atherosclerosis of native coronary artery     Minimal, LVEF 60%  . Aortic stenosis     Mild to moderate  . Bicuspid aortic valve      Review of Systems  Constitutional: Negative for fever and unexpected weight change.  Respiratory: Negative for cough and shortness of breath.   Cardiovascular: Negative for chest pain and leg swelling.  Neurological: Negative for facial asymmetry, weakness and headaches.       Objective:   Physical Exam BP 130/78  Pulse 60  Temp 98.2 F (36.8 C) (Oral)  Ht 6\' 3"  (1.905 m)  Wt 257 lb 6.4 oz (116.756 kg)  BMI 32.17 kg/m2  SpO2 93% Wt Readings from Last 3 Encounters:  01/05/12 257 lb 6.4 oz (116.756 kg)  09/22/11 253 lb (114.76 kg)  07/09/11 249 lb (112.946 kg)   Constitutional:  He appears well-developed and well-nourished. No distress.  Neck: Normal range of motion. Neck supple. No JVD present. No thyromegaly present.  Cardiovascular: Normal rate, regular rhythm and normal heart sounds.  No murmur heard. no BLE edema Pulmonary/Chest: Effort normal and breath sounds normal. No respiratory distress. no wheezes.  Neurological: he is alert and oriented to person, place, and time. No cranial nerve deficit. Coordination normal.  Skin: Skin is warm and dry.  No erythema or ulceration.  Psychiatric: he has a normal mood and  affect. behavior is normal. Judgment and thought content normal.   Lab Results  Component Value Date   WBC 6.2 06/18/2011   HGB 15.9 06/18/2011   HCT 46.2 06/18/2011   PLT 243.0 06/18/2011   GLUCOSE 94 06/18/2011   CHOL 219* 06/18/2011   TRIG 92.0 06/18/2011   HDL 44.20 06/18/2011   LDLDIRECT 163.0 06/18/2011   LDLCALC  Value: 142       10/25/2008   ALT 26 06/18/2011   AST 26 06/18/2011   NA 140 06/18/2011   K 4.3 06/18/2011   CL 105 06/18/2011   CREATININE 0.8 06/18/2011   BUN 16 06/18/2011   CO2 28 06/18/2011   TSH 1.23 06/18/2011   PSA 0.36 06/18/2011    2d Echo 07/14/2011 LVEF is stable at 55-60%. Aortic stenosis in the mild to moderate range with relatively stable valve area calculations and stable mean gradient of 15 mm mercury. Would continue same followup plan.       Assessment & Plan:    Near syncope x 1 event Bradycardia, mild fatigue  Reduce beta-blocker Declines cardiac monitor at this time but will call if other issues Reviewed 06/2011 echo:07/14/2011 LVEF is stable at 55-60%. Aortic stenosis in the mild to moderate range with relatively stable valve area calculations and stable mean gradient of 15 mm mercury.

## 2012-01-19 ENCOUNTER — Ambulatory Visit (INDEPENDENT_AMBULATORY_CARE_PROVIDER_SITE_OTHER): Payer: BC Managed Care – PPO | Admitting: Internal Medicine

## 2012-01-19 ENCOUNTER — Encounter: Payer: Self-pay | Admitting: Internal Medicine

## 2012-01-19 VITALS — BP 130/82 | HR 79 | Temp 99.0°F | Ht 75.0 in | Wt 260.1 lb

## 2012-01-19 DIAGNOSIS — F411 Generalized anxiety disorder: Secondary | ICD-10-CM

## 2012-01-19 DIAGNOSIS — R55 Syncope and collapse: Secondary | ICD-10-CM

## 2012-01-19 MED ORDER — ALPRAZOLAM 0.5 MG PO TABS: 0.5000 mg | ORAL_TABLET | Freq: Three times a day (TID) | ORAL | Status: AC | PRN

## 2012-01-19 MED ORDER — VENLAFAXINE HCL ER 150 MG PO CP24: 150.0000 mg | ORAL_CAPSULE | Freq: Every day | ORAL | Status: DC

## 2012-01-19 NOTE — Assessment & Plan Note (Signed)
Hx panic attacks - on effexor for many years now anxiety-induced symptoms when getting into car Increase dose now and recommended counseling - pt will consider

## 2012-01-19 NOTE — Progress Notes (Signed)
Subjective:    Patient ID: Danny Johnson, male    DOB: Sep 08, 1950, 61 y.o.   MRN: [id number]  HPI  Here for follow up on near syncope event 2.5 weeks ago Occurred while driving - able to pull to side and check self [place] nearby firestation Reports glc 73, BP 140s, HR ?50s felt symptoms related to beta-blocker and low BP/low HR - so beta-blocker dose reduced 2 weeks ago- Feels improved, no recurrence but concerned about heart  No residual dizziness  New anxiety when in car with panic attack and fear -hx same Also increasing "stress" about retirement -"nothing to fill my time" - but denies SI/HI Also stopped smoking 11/2011  Past Medical History  Diagnosis Date  . PVC's (premature ventricular contractions)   . Melanoma 1993  . Anxiety   . Asthma   . Mixed hyperlipidemia   . Essential hypertension, benign   . Arthritis   . Coronary atherosclerosis of native coronary artery     Minimal, LVEF 60%  . Aortic stenosis     Mild to moderate  . Bicuspid aortic valve      Review of Systems  Constitutional: Negative for fever and unexpected weight change.  Respiratory: Negative for cough and shortness of breath.   Cardiovascular: Negative for chest pain and leg swelling.  Neurological: Negative for facial asymmetry, weakness and headaches.       Objective:   Physical Exam  BP 130/82  Pulse 79  Temp 99 F (37.2 C) (Oral)  Ht 6\' 3"  (1.905 m)  Wt 260 lb 1.9 oz (117.99 kg)  BMI 32.51 kg/m2  SpO2 96% Wt Readings from Last 3 Encounters:  01/19/12 260 lb 1.9 oz (117.99 kg)  01/05/12 257 lb 6.4 oz (116.756 kg)  09/22/11 253 lb (114.76 kg)   Constitutional:  He appears well-developed and well-nourished. No distress.  Neck: Normal range of motion. Neck supple. No JVD present. No thyromegaly present.  Cardiovascular: Normal rate, regular rhythm and normal heart sounds.  No murmur heard. no BLE edema Pulmonary/Chest: Effort normal and breath sounds normal. No respiratory distress. no  wheezes.  Neurological: he is alert and oriented to person, place, and time. No cranial nerve deficit. Coordination normal.  Skin: Skin is warm and dry.  No erythema or ulceration.  Psychiatric: he has an anxious and occ tearful mood/affect. behavior is normal. Judgment and thought content normal.   Lab Results  Component Value Date   WBC 6.2 06/18/2011   HGB 15.9 06/18/2011   HCT 46.2 06/18/2011   PLT 243.0 06/18/2011   GLUCOSE 94 06/18/2011   CHOL 219* 06/18/2011   TRIG 92.0 06/18/2011   HDL 44.20 06/18/2011   LDLDIRECT 163.0 06/18/2011   LDLCALC  Value: 142       10/25/2008   ALT 26 06/18/2011   AST 26 06/18/2011   NA 140 06/18/2011   K 4.3 06/18/2011   CL 105 06/18/2011   CREATININE 0.8 06/18/2011   BUN 16 06/18/2011   CO2 28 06/18/2011   TSH 1.23 06/18/2011   PSA 0.36 06/18/2011    2d Echo 07/14/2011 LVEF is stable at 55-60%. Aortic stenosis in the mild to moderate range with relatively stable valve area calculations and stable mean gradient of 15 mm mercury. Would continue same followup plan.  ECG: NSR 65 bpm - no iscemci changes    Assessment & Plan:    Near syncope x 1 event 12/2011 Bradycardia, mild - resolved with reduction in beta-blocker dose  ECG today  normal Order stress test rule out ischemic symptoms  Reviewed 06/2011 echo as above - follow up cards as planned

## 2012-01-19 NOTE — Patient Instructions (Signed)
It was good to see you today. Increase Effexor to 150mg  daily and use xanax as needed - Your prescription(s) have been submitted to your pharmacy. Please take as directed and contact our office if you believe you are having problem(s) with the medication(s). we'll make referral for cardiac stress test. Our office will contact you regarding appointment(s) once made. Please schedule followup in 6 weeks, call sooner if problems.

## 2012-02-03 ENCOUNTER — Ambulatory Visit (HOSPITAL_COMMUNITY): Payer: BC Managed Care – PPO | Attending: Internal Medicine | Admitting: Radiology

## 2012-02-03 VITALS — Ht 74.0 in | Wt 268.0 lb

## 2012-02-03 DIAGNOSIS — I10 Essential (primary) hypertension: Secondary | ICD-10-CM | POA: Insufficient documentation

## 2012-02-03 DIAGNOSIS — R42 Dizziness and giddiness: Secondary | ICD-10-CM | POA: Insufficient documentation

## 2012-02-03 DIAGNOSIS — R0789 Other chest pain: Secondary | ICD-10-CM | POA: Insufficient documentation

## 2012-02-03 DIAGNOSIS — R079 Chest pain, unspecified: Secondary | ICD-10-CM

## 2012-02-03 DIAGNOSIS — R55 Syncope and collapse: Secondary | ICD-10-CM

## 2012-02-03 DIAGNOSIS — Z87891 Personal history of nicotine dependence: Secondary | ICD-10-CM | POA: Insufficient documentation

## 2012-02-03 DIAGNOSIS — R002 Palpitations: Secondary | ICD-10-CM | POA: Insufficient documentation

## 2012-02-03 DIAGNOSIS — I4949 Other premature depolarization: Secondary | ICD-10-CM

## 2012-02-03 MED ORDER — TECHNETIUM TC 99M SESTAMIBI GENERIC - CARDIOLITE
11.0000 | Freq: Once | INTRAVENOUS | Status: AC | PRN
  Administered 2012-02-03: 11 via INTRAVENOUS

## 2012-02-03 MED ORDER — TECHNETIUM TC 99M SESTAMIBI GENERIC - CARDIOLITE
33.0000 | Freq: Once | INTRAVENOUS | Status: AC | PRN
  Administered 2012-02-03: 33 via INTRAVENOUS

## 2012-02-03 NOTE — Progress Notes (Signed)
[facility] SITE 3 NUCLEAR MED [address] [place] [address] [phone]  Cardiology Nuclear Med Study  CALEY VOLKERT is a 61 y.o. male     MRN : [id number]     DOB: 07-29-1950  Procedure Date: 02/03/2012  Nuclear Med Background Indication for Stress Test:  Evaluation for Ischemia History:  6/10 OZH:YQMVH inferior defect, no ischemia, EF=45%>Cath:normal coronaries, EF=60%; h/o NSVT Cardiac Risk Factors: Family History - CAD, History of Smoking, Hypertension, Lipids and Obesity  Symptoms:  Chest Tightness (last episode of chest discomfort was about 2-weeks ago), Dizziness, Light-Headedness, Near Syncope in August, Palpitations and Rapid HR   Nuclear Pre-Procedure Caffeine/Decaff Intake:  None NPO After: 8:30am   Lungs:  Clear. O2 Sat: 97% on room air. IV 0.9% NS with Angio Cath:  20g  IV Site: R Antecubital  IV Started by:  Bonnita Levan, RN  Chest Size (in):  48 Cup Size: n/a  Height: 6\' 2"  (1.88 m)  Weight:  268 lb (121.564 kg)  BMI:  Body mass index is 34.41 kg/(m^2). Tech Comments:  Patient held Lopressor x 24 hrs    Nuclear Med Study 1 or 2 day study: 1 day  Stress Test Type:  Stress  Reading MD: Willa Rough, MD  Order Authorizing Provider:  Rene Paci, MD  Resting Radionuclide: Technetium 43m Sestamibi  Resting Radionuclide Dose: 11.0 mCi   Stress Radionuclide:  Technetium 70m Sestamibi  Stress Radionuclide Dose: 33.0 mCi           Stress Protocol Rest HR: 66 Stress HR: 151  Rest BP: 135/88 Stress BP: 207/83  Exercise Time (min): 6:30 METS: 7.0   Predicted Max HR: 157 bpm % Max HR: 96.18 bpm Rate Pressure Product: 84696   Dose of Adenosine (mg):  n/a Dose of Lexiscan: n/a mg  Dose of Atropine (mg): n/a Dose of Dobutamine: n/a mcg/kg/min (at max HR)  Stress Test Technologist: Smiley Houseman, CMA-N  Nuclear Technologist:  Domenic Polite, CNMT     Rest Procedure:  Myocardial perfusion imaging was performed at rest 45 minutes  following the intravenous administration of Technetium 46m Sestamibi.  Rest ECG: NSR  66 bpm.    Stress Procedure:  The patient performed treadmill exercise using a Bruce  Protocol for 630 minutes. The patient stopped due to fatigue and dyspnea.  He denied any chest pain.  There were no diagnostic ST-T wave changes, occasional PAC's and PVC' with couplets were noted.  He did have a hypertensive response, 207/83.  Technetium 58m Sestamibi was injected at peak exercise and myocardial perfusion imaging was performed after a brief delay.  Stress ECG: No significant change from baseline ECG  QPS Raw Data Images:  Soft tissue (diaphragm, subcutaneous fat) surround heart.  Bowel activity interferes with rest images. Stress Images:  MIld thinnin in the mid/distal inferior wall.  Mild apical thinning  Otherwise normal perfusion. Rest Images:  Bowel activity interferes.  Very small region in inferoapex that improves.  Otherwise no significant change. Subtraction (SDS):  Inaccurate with bowel activity. Transient Ischemic Dilatation (Normal <1.22):  0.95 Lung/Heart Ratio (Normal <0.45):  0.44  Quantitative Gated Spect Images QGS EDV:  122 ml QGS ESV:  56 ml  Impression Exercise Capacity:  Good exercise capacity. BP Response:  Normal blood pressure response. Clinical Symptoms:  No chest pain. ECG Impression:  No significant ST segment change suggestive of ischemia. Comparison with Prior Nuclear Study:  Previous study showed fixed inferior defect with no ischemia consistent with soft  tissue attenuation.  Overall Impression:  Probable normal perfusion and soft tissue attenuation (diaphragm, bowel).  Cannot excluded very small region of mild apical ischemia.  Overall low risk scan.  LV Ejection Fraction: 54%.  LV Wall Motion:  NL LV Function; NL Wall Motion  Dietrich Pates

## 2012-05-05 ENCOUNTER — Telehealth: Payer: Self-pay | Admitting: *Deleted

## 2012-05-05 DIAGNOSIS — Z0000 Encounter for general adult medical examination without abnormal findings: Secondary | ICD-10-CM

## 2012-05-05 DIAGNOSIS — Z125 Encounter for screening for malignant neoplasm of prostate: Secondary | ICD-10-CM

## 2012-05-05 NOTE — Telephone Encounter (Signed)
Received staff msg pt made cpx for 06/22/12. Entering cpx labs in system...Raechel Chute

## 2012-05-05 NOTE — Telephone Encounter (Signed)
Message copied by Deatra James on Fri May 05, 2012 10:32 AM ------      Message from: Etheleen Sia      Created: Fri May 05, 2012 10:10 AM      Regarding: LABS       PHYSICAL LABS FOR JAN 30 APPT

## 2012-05-15 ENCOUNTER — Other Ambulatory Visit: Payer: Self-pay | Admitting: *Deleted

## 2012-05-15 MED ORDER — ALBUTEROL SULFATE HFA 108 (90 BASE) MCG/ACT IN AERS: 2.0000 | INHALATION_SPRAY | RESPIRATORY_TRACT | Status: DC | PRN

## 2012-06-13 ENCOUNTER — Other Ambulatory Visit: Payer: Self-pay | Admitting: *Deleted

## 2012-06-13 MED ORDER — QUINAPRIL HCL 20 MG PO TABS: 20.0000 mg | ORAL_TABLET | Freq: Every day | ORAL | Status: DC

## 2012-06-22 ENCOUNTER — Encounter: Payer: BC Managed Care – PPO | Admitting: Internal Medicine

## 2012-06-29 ENCOUNTER — Other Ambulatory Visit (INDEPENDENT_AMBULATORY_CARE_PROVIDER_SITE_OTHER): Payer: BC Managed Care – PPO

## 2012-06-29 DIAGNOSIS — Z125 Encounter for screening for malignant neoplasm of prostate: Secondary | ICD-10-CM

## 2012-06-29 DIAGNOSIS — Z0000 Encounter for general adult medical examination without abnormal findings: Secondary | ICD-10-CM

## 2012-06-29 LAB — LIPID PANEL: HDL: 41.8 mg/dL (ref >39.00)

## 2012-06-29 LAB — CBC WITH DIFFERENTIAL/PLATELET
Eosinophils Relative: 8.5 % — ABNORMAL HIGH (ref 0.0–5.0)
HCT: 46.4 % (ref 39.0–52.0)
Hemoglobin: 16.2 g/dL (ref 13.0–17.0)
Lymphs Abs: 2.1 10*3/uL (ref 0.7–4.0)
Monocytes Relative: 11 % (ref 3.0–12.0)
Neutro Abs: 3.4 10*3/uL (ref 1.4–7.7)
RDW: 12.7 % (ref 11.5–14.6)

## 2012-06-29 LAB — HEPATIC FUNCTION PANEL
ALT: 32 U/L (ref 0–53)
Albumin: 4.2 g/dL (ref 3.5–5.2)
Bilirubin, Direct: 0.1 mg/dL (ref 0.0–0.3)
Total Protein: 6.8 g/dL (ref 6.0–8.3)

## 2012-06-29 LAB — URINALYSIS, ROUTINE W REFLEX MICROSCOPIC
Bilirubin Urine: NEGATIVE
Ketones, ur: NEGATIVE
Total Protein, Urine: NEGATIVE
pH: 6.5 (ref 5.0–8.0)

## 2012-06-29 LAB — BASIC METABOLIC PANEL
Chloride: 100 mEq/L (ref 96–112)
GFR: 84.98 mL/min (ref >60.00)
Glucose, Bld: 107 mg/dL — ABNORMAL HIGH (ref 70–99)
Potassium: 4.6 mEq/L (ref 3.5–5.1)
Sodium: 136 mEq/L (ref 135–145)

## 2012-06-29 LAB — LDL CHOLESTEROL, DIRECT: Direct LDL: 153.1 mg/dL

## 2012-06-29 LAB — PSA: PSA: 0.36 ng/mL (ref 0.10–4.00)

## 2012-07-05 ENCOUNTER — Encounter: Payer: BC Managed Care – PPO | Admitting: Internal Medicine

## 2012-07-05 ENCOUNTER — Encounter: Payer: Self-pay | Admitting: Internal Medicine

## 2012-07-05 ENCOUNTER — Ambulatory Visit (INDEPENDENT_AMBULATORY_CARE_PROVIDER_SITE_OTHER): Payer: BC Managed Care – PPO | Admitting: Internal Medicine

## 2012-07-05 VITALS — BP 128/84 | HR 81 | Temp 98.8°F | Ht 75.0 in | Wt 260.4 lb

## 2012-07-05 DIAGNOSIS — Z23 Encounter for immunization: Secondary | ICD-10-CM

## 2012-07-05 DIAGNOSIS — F411 Generalized anxiety disorder: Secondary | ICD-10-CM

## 2012-07-05 DIAGNOSIS — Z0000 Encounter for general adult medical examination without abnormal findings: Secondary | ICD-10-CM

## 2012-07-05 DIAGNOSIS — E785 Hyperlipidemia, unspecified: Secondary | ICD-10-CM

## 2012-07-05 DIAGNOSIS — I10 Essential (primary) hypertension: Secondary | ICD-10-CM

## 2012-07-05 MED ORDER — QUINAPRIL HCL 20 MG PO TABS: 20.0000 mg | ORAL_TABLET | Freq: Every day | ORAL | Status: DC

## 2012-07-05 MED ORDER — ATORVASTATIN CALCIUM 10 MG PO TABS: 10.0000 mg | ORAL_TABLET | Freq: Every day | ORAL | Status: DC

## 2012-07-05 MED ORDER — VENLAFAXINE HCL ER 75 MG PO CP24: 75.0000 mg | ORAL_CAPSULE | Freq: Every day | ORAL | Status: DC

## 2012-07-05 NOTE — Patient Instructions (Addendum)
It was good to see you today. Health Maintenance reviewed - flu shot today - check with [employer] insurance about coverage for shingles vaccine - all other recommended immunizations and age-appropriate screenings are up-to-date. Start low dose generic Lipitor for your cholesterol and reduce dose Effexor to 75 mg daily - Your prescription(s) have been submitted to your pharmacy. Please take as directed and contact our office if you believe you are having problem(s) with the medication(s). other medications reviewed and updated Work on lifestyle changes as discussed (low fat, low carb, increased protein diet; improved exercise efforts; weight loss) to control sugar, blood pressure and cholesterol levels and/or reduce risk of developing other medical problems. Look into [email or web address] or other type of food journal to assist you in this process. Please schedule followup in 6-12 months for cholesterol and weight check, call sooner if problems.   Health Maintenance, Males A healthy lifestyle and preventative care can promote health and wellness.  Maintain regular health, dental, and eye exams.  Eat a healthy diet. Foods like vegetables, fruits, whole grains, low-fat dairy products, and lean protein foods contain the nutrients you need without too many calories. Decrease your intake of foods high in solid fats, added sugars, and salt. Get information about a proper diet from your caregiver, if necessary.  Regular physical exercise is one of the most important things you can do for your health. Most adults should get at least 150 minutes of moderate-intensity exercise (any activity that increases your heart rate and causes you to sweat) each week. In addition, most adults need muscle-strengthening exercises on 2 or more days a week.   Maintain a healthy weight. The body mass index (BMI) is a screening tool to identify possible weight problems. It provides an estimate of body fat based on height and weight.  Your caregiver can help determine your BMI, and can help you achieve or maintain a healthy weight. For adults 20 years and older:  A BMI below 18.5 is considered underweight.  A BMI of 18.5 to 24.9 is normal.  A BMI of 25 to 29.9 is considered overweight.  A BMI of 30 and above is considered obese.  Maintain normal blood lipids and cholesterol by exercising and minimizing your intake of saturated fat. Eat a balanced diet with plenty of fruits and vegetables. Blood tests for lipids and cholesterol should begin at age 42 and be repeated every 5 years. If your lipid or cholesterol levels are high, you are over 50, or you are a high risk for heart disease, you may need your cholesterol levels checked more frequently.Ongoing high lipid and cholesterol levels should be treated with medicines, if diet and exercise are not effective.  If you smoke, find out from your caregiver how to quit. If you do not use tobacco, do not start.  If you choose to drink alcohol, do not exceed 2 drinks per day. One drink is considered to be 12 ounces (355 mL) of beer, 5 ounces (148 mL) of wine, or 1.5 ounces (44 mL) of liquor.  Avoid use of street drugs. Do not share needles with anyone. Ask for help if you need support or instructions about stopping the use of drugs.  High blood pressure causes heart disease and increases the risk of stroke. Blood pressure should be checked at least every 1 to 2 years. Ongoing high blood pressure should be treated with medicines if weight loss and exercise are not effective.  If you are 81 to 64 years old, ask your  caregiver if you should take aspirin to prevent heart disease.  Diabetes screening involves taking a blood sample to check your fasting blood sugar level. This should be done once every 3 years, after age 30, if you are within normal weight and without risk factors for diabetes. Testing should be considered at a younger age or be carried out more frequently if you are  overweight and have at least 1 risk factor for diabetes.  Colorectal cancer can be detected and often prevented. Most routine colorectal cancer screening begins at the age of 7 and continues through age 53. However, your caregiver may recommend screening at an earlier age if you have risk factors for colon cancer. On a yearly basis, your caregiver may provide home test kits to check for hidden blood in the stool. Use of a small camera at the end of a tube, to directly examine the colon (sigmoidoscopy or colonoscopy), can detect the earliest forms of colorectal cancer. Talk to your caregiver about this at age 72, when routine screening begins. Direct examination of the colon should be repeated every 5 to 10 years through age 63, unless early forms of pre-cancerous polyps or small growths are found.  Hepatitis C blood testing is recommended for all people born from [birth year] through 1967 and any individual with known risks for hepatitis C.  Healthy men should no longer receive prostate-specific antigen (PSA) blood tests as part of routine cancer screening. Consult with your caregiver about prostate cancer screening.  Testicular cancer screening is not recommended for adolescents or adult males who have no symptoms. Screening includes self-exam, caregiver exam, and other screening tests. Consult with your caregiver about any symptoms you have or any concerns you have about testicular cancer.  Practice safe sex. Use condoms and avoid high-risk sexual practices to reduce the spread of sexually transmitted infections (STIs).  Use sunscreen with a sun protection factor (SPF) of 30 or greater. Apply sunscreen liberally and repeatedly throughout the day. You should seek shade when your shadow is shorter than you. Protect yourself by wearing long sleeves, pants, a wide-brimmed hat, and sunglasses year round, whenever you are outdoors.  Notify your caregiver of new moles or changes in moles, especially if there is a  change in shape or color. Also notify your caregiver if a mole is larger than the size of a pencil eraser.  A one-time screening for abdominal aortic aneurysm (AAA) and surgical repair of large AAAs by sound wave imaging (ultrasonography) is recommended for ages 44 to 45 years who are current or former smokers.  Stay current with your immunizations. Document Released: 11/06/2007 Document Revised: 08/02/2011 Document Reviewed: 10/05/2010 [facility] Patient Information 2013 [place], [place].

## 2012-07-05 NOTE — Assessment & Plan Note (Signed)
BP Readings from Last 3 Encounters:  07/05/12 128/84  01/19/12 130/82  01/05/12 130/78   Reduced beta-blocker 12/2011 due to possible overtx contributing to fatigue and near syncope symptoms (now resolved) cont ACEI

## 2012-07-05 NOTE — Progress Notes (Signed)
Subjective:    Patient ID: Danny Johnson, male    DOB: 1951-03-29, 62 y.o.   MRN: [id number]  HPI  patient is here today for annual physical. Patient feels well overall.     also reviewed other chronic issues: asthma - worse during cold months not using steroid inhaler and rare use of rescue inhaler also worse in the house due to allergens -dust  Seasonal allergies -manifest by clear nasal drainage and eye water/itch - wosre with cat, dust in house setting never on shots, uses OTC meds for same   hypertension -reports compliance with ongoing medical treatment and no changes in medication dose or frequency. denies adverse side effects related to current therapy. No chest pain, shortness of breath, headache or edema   dyslipidemia -self directed discontinuation of prior statin treatment due to myalgias in 2010 - symptoms improved off tx, but agreeable to retry med "if i need to"   anxiety -takes generic Effexor for same.reports compliance with ongoing medical treatment and no changes in medication dose or frequency. denies adverse side effects related to current therapy. no panic symptoms or exacerbations, ?reduce dose med to slowly wean off same if able    Past Medical History  Diagnosis Date  . PVC's (premature ventricular contractions)   . Melanoma 1993  . Anxiety   . Asthma   . Mixed hyperlipidemia   . Essential hypertension, benign   . Arthritis   . Coronary atherosclerosis of native coronary artery     Minimal, LVEF 60%  . Aortic stenosis     Mild to moderate  . Bicuspid aortic valve    Family History  Problem Relation Age of Onset  . Dementia Mother    History  Substance Use Topics  . Smoking status: Former Smoker -- 1.50 packs/day    Types: Cigarettes    Quit date: 12/25/2011  . Smokeless tobacco: Not on file  . Alcohol Use: 21.0 oz/week    42 drink(s) per week     Comment: 4-6 drinks daily, beer/wine    Review of Systems  Constitutional: Negative for  fever or unexpected weight change.  Respiratory: Negative for cough and shortness of breath.   Cardiovascular: Negative for chest pain or palpitations.  Gastrointestinal: Negative for abdominal pain, no bowel changes.  Musculoskeletal: Negative for gait problem or joint swelling.  Skin: Negative for rash.  Neurological: Negative for dizziness or headache.  No other specific complaints in a complete review of systems (except as listed in HPI above).     Objective:   Physical Exam  BP 128/84  Pulse 81  Temp(Src) 98.8 F (37.1 C) (Oral)  Ht 6\' 3"  (1.905 m)  Wt 260 lb 6.4 oz (118.117 kg)  BMI 32.55 kg/m2  SpO2 97% Wt Readings from Last 3 Encounters:  07/05/12 260 lb 6.4 oz (118.117 kg)  02/03/12 268 lb (121.564 kg)  01/19/12 260 lb 1.9 oz (117.99 kg)   Constitutional:  He is overweight, but appears well-developed and well-nourished. No distress.  HENT: Normocephalic atraumatic, sinuses nontender; TMs clear without cerumen, effusion or erythema. oropharynx without erythema or exudate. No postnasal drip. Teeth in fair condition  Eyes: PERRL, EOMI. No conjunctivitis or icterus. Uncorrected vision grossly intact Neck: Normal range of motion. Neck supple. No JVD present. No thyromegaly present.  Cardiovascular: Normal rate, regular rhythm and normal heart sounds.  Faint 2/6 systolic murmur heard. no BLE edema Pulmonary/Chest: Effort normal and breath sounds normal. No respiratory distress. no wheezes.  Abdominal: obese, soft.  Bowel sounds are normal. Patient exhibits no distension. There is no tenderness.  Musculoskeletal: Normal range of motion. Patient exhibits no gross deformity.  Neurological: he is alert and oriented to person, place, and time. No cranial nerve deficit. Coordination normal.  Skin: Skin is warm and dry.  No erythema or ulceration.  Psychiatric: he has a normal mood and affect. behavior is normal. Judgment and thought content normal.   Lab Results  Component Value  Date   WBC 6.8 06/29/2012   HGB 16.2 06/29/2012   HCT 46.4 06/29/2012   PLT 290.0 06/29/2012   GLUCOSE 107* 06/29/2012   CHOL 224* 06/29/2012   TRIG 149.0 06/29/2012   HDL 41.80 06/29/2012   LDLDIRECT 153.1 06/29/2012   LDLCALC  Value: 142        Total Cholesterol/HDL:CHD Risk Coronary Heart Disease Risk Table                     Men   Women  1/2 Average Risk   3.4   3.3  Average Risk       5.0   4.4  2 X Average Risk   9.6   7.1  3 X Average Risk  23.4   11.0        Use the calculated Patient Ratio above and the CHD Risk Table to determine the patient's CHD Risk.        ATP III CLASSIFICATION (LDL):  <100     mg/dL   Optimal  161-096  mg/dL   Near or Above                    Optimal  130-159  mg/dL   Borderline  045-409  mg/dL   High  >811     mg/dL   Very High* 01/22/4781   ALT 32 06/29/2012   AST 27 06/29/2012   NA 136 06/29/2012   K 4.6 06/29/2012   CL 100 06/29/2012   CREATININE 1.0 06/29/2012   BUN 14 06/29/2012   CO2 30 06/29/2012   TSH 1.40 06/29/2012   PSA 0.36 06/29/2012   EKG: NSR @ 78 bpm - no ischemic changes, no arrythmia     Assessment & Plan:  CPX - v70.0 - Patient has been counseled on age-appropriate routine health concerns for screening and prevention. These are reviewed and up-to-date. Immunizations are up-to-date or declined. Labs and ECG reviewed.  Also see problem list. Medications and labs reviewed today.

## 2012-07-05 NOTE — Assessment & Plan Note (Signed)
Remotely on statin, stopped 2010 due to myalgias Willing to re-try low dose med at this time to reduce CAD risk atorva 10g qd - erx done follow up with cards as planned and here in 6 months to recheck FLP/LFTs, call sooner if problems

## 2012-07-05 NOTE — Assessment & Plan Note (Signed)
Hx panic attacks - on effexor for many years Increased dose 12/2011 when anxiety-induced symptoms triggered by getting into car symptoms now improved, wean back to 75mg  q - erx done - lower and eventually off is symptoms allow

## 2012-07-17 ENCOUNTER — Encounter: Payer: Self-pay | Admitting: Cardiology

## 2012-07-17 ENCOUNTER — Ambulatory Visit (INDEPENDENT_AMBULATORY_CARE_PROVIDER_SITE_OTHER): Payer: BC Managed Care – PPO | Admitting: Cardiology

## 2012-07-17 VITALS — BP 128/80 | HR 94 | Ht 74.5 in | Wt 260.0 lb

## 2012-07-17 DIAGNOSIS — I4949 Other premature depolarization: Secondary | ICD-10-CM

## 2012-07-17 DIAGNOSIS — I493 Ventricular premature depolarization: Secondary | ICD-10-CM

## 2012-07-17 DIAGNOSIS — I359 Nonrheumatic aortic valve disorder, unspecified: Secondary | ICD-10-CM

## 2012-07-17 DIAGNOSIS — I2510 Atherosclerotic heart disease of native coronary artery without angina pectoris: Secondary | ICD-10-CM

## 2012-07-17 NOTE — Patient Instructions (Addendum)
Your physician recommends that you schedule a follow-up appointment in: ONE YEAR 

## 2012-07-17 NOTE — Assessment & Plan Note (Signed)
Bicuspid aortic valve with mild to moderate aortic stenosis, stable by echocardiogram last year. No change in examination, no new symptoms. Continue observation for now.

## 2012-07-17 NOTE — Assessment & Plan Note (Signed)
Minimal at cardiac catheterization with more recent followup Myoview demonstrating no active ischemia.

## 2012-07-17 NOTE — Progress Notes (Signed)
   Clinical Summary Danny Johnson is a 62 y.o.male presenting for followup. I last saw him in May 2013. Has had recent interval followup with Dr. Felicity Johnson.  Echocardiogram from 2/13 showed LVEF 55-60% with mld to moderate stable aortic stenosis, mean gradient 15 mmHg. Reviewed his today.  He reports no progressive palpitations, no syncope. Beta blocker dose has been cut in half, fatigue has improved. He plans to get more active, has purchased a stationary bicycle, also plans to start playing tennis.  Prior Myoview from September of last year did not demonstrate any ischemia, low risk, LVEF 54%.   No Known Allergies  Current Outpatient Prescriptions  Medication Sig Dispense Refill  . albuterol (PROVENTIL HFA;VENTOLIN HFA) 108 (90 BASE) MCG/ACT inhaler Inhale 2 puffs into the lungs every 4 (four) hours as needed.  54 g  1  . aspirin 81 MG tablet Take 81 mg by mouth daily.        Marland Kitchen atorvastatin (LIPITOR) 10 MG tablet Take 1 tablet (10 mg total) by mouth daily.  30 tablet  3  . metoprolol tartrate (LOPRESSOR) 25 MG tablet Take 0.5 tablets (12.5 mg total) by mouth 2 (two) times daily.      . quinapril (ACCUPRIL) 20 MG tablet Take 1 tablet (20 mg total) by mouth daily.  90 tablet  3  . venlafaxine XR (EFFEXOR-XR) 75 MG 24 hr capsule Take 1 capsule (75 mg total) by mouth daily.  90 capsule  3   No current facility-administered medications for this visit.    Past Medical History  Diagnosis Date  . PVC's (premature ventricular contractions)   . Melanoma 1993  . Anxiety   . Asthma   . Mixed hyperlipidemia   . Essential hypertension, benign   . Arthritis   . Coronary atherosclerosis of native coronary artery     Minimal, LVEF 60%  . Aortic stenosis     Mild to moderate  . Bicuspid aortic valve     Social History Danny Johnson reports that he has been smoking Cigarettes.  He has a 10 pack-year smoking history. He does not have any smokeless tobacco history on file. Danny Johnson reports that he  drinks about 21.0 ounces of alcohol per week.  Review of Systems Negative except as outlined.   Physical Examination Filed Vitals:   07/17/12 1427  BP: 128/80  Pulse: 94   Filed Weights   07/17/12 1427  Weight: 260 lb (117.935 kg)    Patient comfortable in no acute distress.  HEENT: Conjunctiva and lids normal, oropharynx clear.  Neck: Supple, no carotid bruits or thyromegaly.  Lungs: Clear to auscultation.  Cardiac: Regular rate and rhythm, 2/6 systolic murmur at the base, second heart sound preserved, no S3 gallop.  Abdomen: Soft, nontender.  Extremity: No pitting edema.    Problem List and Plan   AORTIC STENOSIS Bicuspid aortic valve with mild to moderate aortic stenosis, stable by echocardiogram last year. No change in examination, no new symptoms. Continue observation for now.  PVC's (premature ventricular contractions) Continue low-dose beta blocker, agree with exercise regimen.  Coronary atherosclerosis of native coronary artery Minimal at cardiac catheterization with more recent followup Myoview demonstrating no active ischemia.    Jonelle Sidle, M.D., F.A.C.C.

## 2012-07-17 NOTE — Assessment & Plan Note (Signed)
Continue low-dose beta blocker, agree with exercise regimen.

## 2012-08-21 ENCOUNTER — Other Ambulatory Visit: Payer: Self-pay | Admitting: Cardiology

## 2012-08-21 MED ORDER — METOPROLOL TARTRATE 25 MG PO TABS: 12.5000 mg | ORAL_TABLET | Freq: Two times a day (BID) | ORAL | Status: DC

## 2012-09-27 ENCOUNTER — Encounter: Payer: Self-pay | Admitting: Internal Medicine

## 2012-09-29 ENCOUNTER — Encounter: Payer: Self-pay | Admitting: Internal Medicine

## 2012-09-29 MED ORDER — LOSARTAN POTASSIUM 50 MG PO TABS: 50.0000 mg | ORAL_TABLET | Freq: Every day | ORAL | Status: DC

## 2012-10-10 ENCOUNTER — Encounter: Payer: Self-pay | Admitting: Internal Medicine

## 2012-10-10 MED ORDER — QUINAPRIL HCL 20 MG PO TABS: 20.0000 mg | ORAL_TABLET | Freq: Every day | ORAL | Status: DC

## 2012-11-17 ENCOUNTER — Other Ambulatory Visit: Payer: Self-pay | Admitting: *Deleted

## 2012-11-17 MED ORDER — ALBUTEROL SULFATE HFA 108 (90 BASE) MCG/ACT IN AERS: 2.0000 | INHALATION_SPRAY | RESPIRATORY_TRACT | Status: DC | PRN

## 2012-11-21 ENCOUNTER — Telehealth: Payer: Self-pay | Admitting: *Deleted

## 2012-11-21 NOTE — Telephone Encounter (Signed)
Received fax pt needing  PA on his albuterol notified insurance fax over PA form has been completed and fax back waiting on approval status...Raechel Chute

## 2012-11-27 ENCOUNTER — Encounter: Payer: Self-pay | Admitting: Internal Medicine

## 2012-11-27 MED ORDER — ALBUTEROL SULFATE HFA 108 (90 BASE) MCG/ACT IN AERS: 2.0000 | INHALATION_SPRAY | RESPIRATORY_TRACT | Status: DC | PRN

## 2012-11-28 ENCOUNTER — Encounter: Payer: Self-pay | Admitting: Internal Medicine

## 2012-11-28 MED ORDER — ALBUTEROL SULFATE HFA 108 (90 BASE) MCG/ACT IN AERS: 2.0000 | INHALATION_SPRAY | Freq: Four times a day (QID) | RESPIRATORY_TRACT | Status: DC | PRN

## 2012-11-28 NOTE — Telephone Encounter (Signed)
Formulary is ProAir - changed brand to formulary = thanks

## 2012-11-28 NOTE — Telephone Encounter (Signed)
Received PA bck med has been denied. Place Pa on md desk to review...lmb

## 2012-11-29 MED ORDER — ALBUTEROL SULFATE HFA 108 (90 BASE) MCG/ACT IN AERS: 2.0000 | INHALATION_SPRAY | Freq: Four times a day (QID) | RESPIRATORY_TRACT | Status: DC | PRN

## 2012-11-29 NOTE — Telephone Encounter (Signed)
Notified pt with md response. Resent inhaler with quanity 3 to mail service...lmb

## 2013-01-01 ENCOUNTER — Encounter: Payer: Self-pay | Admitting: Internal Medicine

## 2013-01-01 DIAGNOSIS — E785 Hyperlipidemia, unspecified: Secondary | ICD-10-CM

## 2013-01-01 DIAGNOSIS — Z79899 Other long term (current) drug therapy: Secondary | ICD-10-CM

## 2013-01-03 ENCOUNTER — Ambulatory Visit: Payer: BC Managed Care – PPO | Admitting: Internal Medicine

## 2013-01-04 ENCOUNTER — Other Ambulatory Visit (INDEPENDENT_AMBULATORY_CARE_PROVIDER_SITE_OTHER): Payer: BC Managed Care – PPO

## 2013-01-04 DIAGNOSIS — Z79899 Other long term (current) drug therapy: Secondary | ICD-10-CM

## 2013-01-04 DIAGNOSIS — E785 Hyperlipidemia, unspecified: Secondary | ICD-10-CM

## 2013-01-04 LAB — HEPATIC FUNCTION PANEL
ALT: 28 U/L (ref 0–53)
AST: 26 U/L (ref 0–37)
Total Bilirubin: 0.8 mg/dL (ref 0.3–1.2)

## 2013-01-04 LAB — LIPID PANEL
HDL: 47.3 mg/dL (ref >39.00)
Triglycerides: 96 mg/dL (ref 0.0–149.0)

## 2013-01-05 ENCOUNTER — Ambulatory Visit: Payer: BC Managed Care – PPO | Admitting: Internal Medicine

## 2013-02-17 ENCOUNTER — Encounter: Payer: Self-pay | Admitting: Internal Medicine

## 2013-02-19 MED ORDER — SILDENAFIL CITRATE 100 MG PO TABS: 50.0000 mg | ORAL_TABLET | Freq: Every day | ORAL | Status: DC | PRN

## 2013-02-19 NOTE — Telephone Encounter (Signed)
Medication never been rx. Is this ok to send...lmb

## 2013-03-29 ENCOUNTER — Other Ambulatory Visit: Payer: Self-pay

## 2013-06-25 ENCOUNTER — Other Ambulatory Visit: Payer: Self-pay | Admitting: Internal Medicine

## 2013-07-04 ENCOUNTER — Encounter: Payer: Self-pay | Admitting: Internal Medicine

## 2013-07-05 ENCOUNTER — Telehealth: Payer: Self-pay | Admitting: *Deleted

## 2013-07-05 ENCOUNTER — Other Ambulatory Visit (INDEPENDENT_AMBULATORY_CARE_PROVIDER_SITE_OTHER): Payer: BC Managed Care – PPO

## 2013-07-05 DIAGNOSIS — Z125 Encounter for screening for malignant neoplasm of prostate: Secondary | ICD-10-CM

## 2013-07-05 DIAGNOSIS — Z0000 Encounter for general adult medical examination without abnormal findings: Secondary | ICD-10-CM

## 2013-07-05 LAB — URINALYSIS, ROUTINE W REFLEX MICROSCOPIC
BILIRUBIN URINE: NEGATIVE
KETONES UR: NEGATIVE
Leukocytes, UA: NEGATIVE
Nitrite: NEGATIVE
SPECIFIC GRAVITY, URINE: 1.02 (ref 1.000–1.030)
Total Protein, Urine: NEGATIVE
URINE GLUCOSE: NEGATIVE
UROBILINOGEN UA: 0.2 (ref 0.0–1.0)
WBC, UA: NONE SEEN (ref 0–?)
pH: 7 (ref 5.0–8.0)

## 2013-07-05 LAB — CBC WITH DIFFERENTIAL/PLATELET
BASOS ABS: 0 10*3/uL (ref 0.0–0.1)
BASOS PCT: 0.7 % (ref 0.0–3.0)
EOS ABS: 0.6 10*3/uL (ref 0.0–0.7)
Eosinophils Relative: 8.5 % — ABNORMAL HIGH (ref 0.0–5.0)
HCT: 50.3 % (ref 39.0–52.0)
Hemoglobin: 16.6 g/dL (ref 13.0–17.0)
LYMPHS PCT: 29 % (ref 12.0–46.0)
Lymphs Abs: 2 10*3/uL (ref 0.7–4.0)
MCHC: 33 g/dL (ref 30.0–36.0)
MCV: 99.7 fl (ref 78.0–100.0)
MONO ABS: 0.7 10*3/uL (ref 0.1–1.0)
Monocytes Relative: 10.8 % (ref 3.0–12.0)
NEUTROS ABS: 3.5 10*3/uL (ref 1.4–7.7)
Neutrophils Relative %: 51 % (ref 43.0–77.0)
Platelets: 281 10*3/uL (ref 150.0–400.0)
RBC: 5.05 Mil/uL (ref 4.22–5.81)
RDW: 13.3 % (ref 11.5–14.6)
WBC: 6.8 10*3/uL (ref 4.5–10.5)

## 2013-07-05 LAB — HEPATIC FUNCTION PANEL
ALK PHOS: 76 U/L (ref 39–117)
ALT: 20 U/L (ref 0–53)
AST: 21 U/L (ref 0–37)
Albumin: 4.1 g/dL (ref 3.5–5.2)
Bilirubin, Direct: 0.1 mg/dL (ref 0.0–0.3)
Total Bilirubin: 0.9 mg/dL (ref 0.3–1.2)
Total Protein: 6.8 g/dL (ref 6.0–8.3)

## 2013-07-05 LAB — LIPID PANEL
CHOL/HDL RATIO: 3
Cholesterol: 175 mg/dL (ref 0–200)
HDL: 52.5 mg/dL (ref >39.00)
LDL Cholesterol: 113 mg/dL — ABNORMAL HIGH (ref 0–99)
Triglycerides: 47 mg/dL (ref 0.0–149.0)
VLDL: 9.4 mg/dL (ref 0.0–40.0)

## 2013-07-05 LAB — BASIC METABOLIC PANEL
BUN: 12 mg/dL (ref 6–23)
CALCIUM: 9.1 mg/dL (ref 8.4–10.5)
CHLORIDE: 104 meq/L (ref 96–112)
CO2: 28 meq/L (ref 19–32)
Creatinine, Ser: 0.8 mg/dL (ref 0.4–1.5)
GFR: 100.38 mL/min (ref >60.00)
GLUCOSE: 113 mg/dL — AB (ref 70–99)
Potassium: 4.8 mEq/L (ref 3.5–5.1)
Sodium: 140 mEq/L (ref 135–145)

## 2013-07-05 LAB — TSH: TSH: 1.38 u[IU]/mL (ref 0.35–5.50)

## 2013-07-05 LAB — PSA: PSA: 0.35 ng/mL (ref 0.10–4.00)

## 2013-07-05 NOTE — Telephone Encounter (Signed)
Pt is in the lab to have cpx labs done. No order in computer. Inform Laurette Schimke will enter...Danny Johnson

## 2013-07-06 ENCOUNTER — Ambulatory Visit: Payer: BC Managed Care – PPO

## 2013-07-06 ENCOUNTER — Encounter: Payer: Self-pay | Admitting: Internal Medicine

## 2013-07-06 DIAGNOSIS — R7309 Other abnormal glucose: Secondary | ICD-10-CM

## 2013-07-06 DIAGNOSIS — R739 Hyperglycemia, unspecified: Secondary | ICD-10-CM | POA: Insufficient documentation

## 2013-07-06 LAB — HEMOGLOBIN A1C: Hgb A1c MFr Bld: 5.8 % (ref 4.6–6.5)

## 2013-07-10 ENCOUNTER — Encounter: Payer: BC Managed Care – PPO | Admitting: Internal Medicine

## 2013-07-17 ENCOUNTER — Ambulatory Visit: Payer: BC Managed Care – PPO | Admitting: Cardiology

## 2013-07-19 ENCOUNTER — Encounter: Payer: Self-pay | Admitting: Internal Medicine

## 2013-07-19 ENCOUNTER — Encounter: Payer: BC Managed Care – PPO | Admitting: Internal Medicine

## 2013-07-19 DIAGNOSIS — Z0289 Encounter for other administrative examinations: Secondary | ICD-10-CM

## 2013-07-20 ENCOUNTER — Ambulatory Visit (INDEPENDENT_AMBULATORY_CARE_PROVIDER_SITE_OTHER): Payer: BC Managed Care – PPO | Admitting: Cardiology

## 2013-07-20 ENCOUNTER — Encounter: Payer: Self-pay | Admitting: Cardiology

## 2013-07-20 VITALS — BP 146/77 | HR 83 | Ht 74.0 in | Wt 255.0 lb

## 2013-07-20 DIAGNOSIS — I2510 Atherosclerotic heart disease of native coronary artery without angina pectoris: Secondary | ICD-10-CM

## 2013-07-20 DIAGNOSIS — I10 Essential (primary) hypertension: Secondary | ICD-10-CM

## 2013-07-20 DIAGNOSIS — I359 Nonrheumatic aortic valve disorder, unspecified: Secondary | ICD-10-CM

## 2013-07-20 DIAGNOSIS — I493 Ventricular premature depolarization: Secondary | ICD-10-CM

## 2013-07-20 DIAGNOSIS — I4949 Other premature depolarization: Secondary | ICD-10-CM

## 2013-07-20 DIAGNOSIS — E785 Hyperlipidemia, unspecified: Secondary | ICD-10-CM

## 2013-07-20 NOTE — Assessment & Plan Note (Signed)
Bicuspid aortic valve with mild to moderate aortic stenosis by last echocardiogram in 2013. He is symptomatically stable and there has been no significant change in examination. Continue annual followup, will consider a repeat echocardiogram for his next year visit.

## 2013-07-20 NOTE — Patient Instructions (Signed)
Your physician wants you to follow-up in: 1 year You will receive a reminder letter in the mail two months in advance. If you don't receive a letter, please call our office to schedule the follow-up appointment.    Your physician recommends that you continue on your current medications as directed. Please refer to the Current Medication list given to you today.     Thank you for choosing [facility] !  

## 2013-07-20 NOTE — Assessment & Plan Note (Signed)
Recent LDL 113. Reinforced compliance with Lipitor.

## 2013-07-20 NOTE — Assessment & Plan Note (Signed)
ECG stable today, no ectopy on exam. He is asymptomatic at this point. Normal LVEF.

## 2013-07-20 NOTE — Progress Notes (Signed)
Clinical Summary [name] is a 63 y.o.male last seen in February 2014. States that he remains active, still does some consulting work, also works at the [employer] course in [place]. He plays golf regularly. Does not endorse any angina or unusual shortness of breath, no palpitations or syncope.  Recent lab work reviewed finding cholesterol 175, triglycerides 47, HDL 52, LDL 113, potassium 4.8, BUN 12, creatinine 0.8, hemoglobin 16.6, platelets 281.  Echocardiogram from 2/13 showed LVEF 55-60% with mld to moderate stable aortic stenosis, mean gradient 15 mmHg.   ECG shows normal sinus rhythm R' in lead V1.   No Known Allergies  Current Outpatient Prescriptions  Medication Sig Dispense Refill  . albuterol (PROAIR HFA) 108 (90 BASE) MCG/ACT inhaler Inhale 2 puffs into the lungs every 6 (six) hours as needed for wheezing or shortness of breath.  3 Inhaler  0  . aspirin 81 MG tablet Take 81 mg by mouth daily.        Marland Kitchen atorvastatin (LIPITOR) 10 MG tablet Take 1 tablet (10 mg total) by mouth daily.  30 tablet  3  . quinapril (ACCUPRIL) 20 MG tablet Take 1 tablet (20 mg total) by mouth daily.  90 tablet  3  . sildenafil (VIAGRA) 100 MG tablet Take 0.5-1 tablets (50-100 mg total) by mouth daily as needed for erectile dysfunction.  5 tablet  3  . venlafaxine XR (EFFEXOR-XR) 75 MG 24 hr capsule TAKE 1 BY MOUTH DAILY  90 capsule  0   No current facility-administered medications for this visit.    Past Medical History  Diagnosis Date  . PVC's (premature ventricular contractions)   . Melanoma 1993  . Anxiety   . Asthma   . Mixed hyperlipidemia   . Essential hypertension, benign   . Arthritis   . Coronary atherosclerosis of native coronary artery     Minimal, LVEF 60%  . Aortic stenosis     Mild to moderate  . Bicuspid aortic valve     Social History Danny Johnson reports that he has been smoking Cigarettes.  He has a 10 pack-year smoking history. He does not have any  smokeless tobacco history on file. Danny Johnson reports that he drinks about 21.0 ounces of alcohol per week.  Review of Systems As outlined above. Occasional arthritic pains. No falls. Stable appetite. Otherwise negative.  Physical Examination Filed Vitals:   07/20/13 1552  BP: 146/77  Pulse: 83   Filed Weights   07/20/13 1552  Weight: 255 lb (115.667 kg)    Patient comfortable in no acute distress.  HEENT: Conjunctiva and lids normal, oropharynx clear.  Neck: Supple, no carotid bruits or thyromegaly.  Lungs: Clear to auscultation.  Cardiac: Regular rate and rhythm, 2/6 systolic murmur at the base, second heart sound preserved, no S3 gallop.  Abdomen: Soft, nontender.  Extremity: No pitting edema.  Skin: Warm and dry. Musko skeletal: No kyphosis. Neuropsychiatric: Alert and oriented x3, affect appropriate.   Problem List and Plan   AORTIC STENOSIS Bicuspid aortic valve with mild to moderate aortic stenosis by last echocardiogram in 2013. He is symptomatically stable and there has been no significant change in examination. Continue annual followup, will consider a repeat echocardiogram for his next year visit.  PVC's (premature ventricular contractions) ECG stable today, no ectopy on exam. He is asymptomatic at this point. Normal LVEF.  Coronary atherosclerosis of native coronary artery No angina, minimal atherosclerosis at cardiac catheterization.  HYPERLIPIDEMIA Recent LDL 113. Reinforced compliance with Lipitor.  Satira Sark, M.D., F.A.C.C.

## 2013-07-20 NOTE — Assessment & Plan Note (Signed)
No angina, minimal atherosclerosis at cardiac catheterization.

## 2013-07-23 ENCOUNTER — Other Ambulatory Visit: Payer: Self-pay | Admitting: Internal Medicine

## 2013-08-02 ENCOUNTER — Encounter: Payer: Self-pay | Admitting: Internal Medicine

## 2013-08-02 ENCOUNTER — Other Ambulatory Visit: Payer: Self-pay | Admitting: *Deleted

## 2013-08-02 MED ORDER — ATORVASTATIN CALCIUM 10 MG PO TABS: 10.0000 mg | ORAL_TABLET | Freq: Every day | ORAL | Status: DC

## 2013-08-02 NOTE — Telephone Encounter (Signed)
Sent email needing refill on his cholesterol medication. Only sent #30 day supply until office visit...Danny Johnson

## 2013-08-21 ENCOUNTER — Telehealth: Payer: Self-pay | Admitting: Internal Medicine

## 2013-08-21 ENCOUNTER — Ambulatory Visit (INDEPENDENT_AMBULATORY_CARE_PROVIDER_SITE_OTHER): Payer: BC Managed Care – PPO | Admitting: Internal Medicine

## 2013-08-21 ENCOUNTER — Encounter: Payer: Self-pay | Admitting: *Deleted

## 2013-08-21 ENCOUNTER — Encounter: Payer: Self-pay | Admitting: Internal Medicine

## 2013-08-21 VITALS — BP 120/82 | HR 82 | Temp 98.3°F | Ht 75.0 in | Wt 244.4 lb

## 2013-08-21 DIAGNOSIS — Z23 Encounter for immunization: Secondary | ICD-10-CM

## 2013-08-21 DIAGNOSIS — F17200 Nicotine dependence, unspecified, uncomplicated: Secondary | ICD-10-CM

## 2013-08-21 DIAGNOSIS — Z2911 Encounter for prophylactic immunotherapy for respiratory syncytial virus (RSV): Secondary | ICD-10-CM

## 2013-08-21 DIAGNOSIS — F411 Generalized anxiety disorder: Secondary | ICD-10-CM

## 2013-08-21 DIAGNOSIS — E785 Hyperlipidemia, unspecified: Secondary | ICD-10-CM

## 2013-08-21 DIAGNOSIS — Z0000 Encounter for general adult medical examination without abnormal findings: Secondary | ICD-10-CM

## 2013-08-21 MED ORDER — VENLAFAXINE HCL ER 37.5 MG PO CP24: 37.5000 mg | ORAL_CAPSULE | Freq: Every day | ORAL | Status: DC

## 2013-08-21 MED ORDER — ATORVASTATIN CALCIUM 10 MG PO TABS: 10.0000 mg | ORAL_TABLET | Freq: Every day | ORAL | Status: DC

## 2013-08-21 MED ORDER — ALPRAZOLAM 0.5 MG PO TABS: 0.5000 mg | ORAL_TABLET | Freq: Two times a day (BID) | ORAL | Status: DC | PRN

## 2013-08-21 NOTE — Patient Instructions (Addendum)
It was good to see you today.  We have reviewed your prior records including labs and tests today  Health Maintenance reviewed - Shingles vaccine administered today -all other recommended immunizations and age-appropriate screenings are up-to-date or declined.  Medications reviewed and updated Reduce venlafaxine to 37 mg daily for anxiety and depression, use Xanax as needed for nerves No other changes recommended at this time.  Your prescription(s) have been submitted to your pharmacy. Please take as directed and contact our office if you believe you are having problem(s) with the medication(s).  Please schedule followup in 12 months for annual exam and labs, call sooner if problems.  Health Maintenance, Males A healthy lifestyle and preventative care can promote health and wellness.  Maintain regular health, dental, and eye exams.  Eat a healthy diet. Foods like vegetables, fruits, whole grains, low-fat dairy products, and lean protein foods contain the nutrients you need and are low in calories. Decrease your intake of foods high in solid fats, added sugars, and salt. Get information about a proper diet from your health care provider, if necessary.  Regular physical exercise is one of the most important things you can do for your health. Most adults should get at least 150 minutes of moderate-intensity exercise (any activity that increases your heart rate and causes you to sweat) each week. In addition, most adults need muscle-strengthening exercises on 2 or more days a week.   Maintain a healthy weight. The body mass index (BMI) is a screening tool to identify possible weight problems. It provides an estimate of body fat based on height and weight. Your health care provider can find your BMI and can help you achieve or maintain a healthy weight. For males 20 years and older:  A BMI below 18.5 is considered underweight.  A BMI of 18.5 to 24.9 is normal.  A BMI of 25 to 29.9 is  considered overweight.  A BMI of 30 and above is considered obese.  Maintain normal blood lipids and cholesterol by exercising and minimizing your intake of saturated fat. Eat a balanced diet with plenty of fruits and vegetables. Blood tests for lipids and cholesterol should begin at age 41 and be repeated every 5 years. If your lipid or cholesterol levels are high, you are over 50, or you are at high risk for heart disease, you may need your cholesterol levels checked more frequently.Ongoing high lipid and cholesterol levels should be treated with medicines, if diet and exercise are not working.  If you smoke, find out from your health care provider how to quit. If you do not use tobacco, do not start.  Lung cancer screening is recommended for adults aged 80 80 years who are at high risk for developing lung cancer because of a history of smoking. A yearly low-dose CT scan of the lungs is recommended for people who have at least a 30-pack-year history of smoking and are a current smoker or have quit within the past 15 years. A pack year of smoking is smoking an average of 1 pack of cigarettes a day for 1 year (for example, a 30-pack-year history of smoking could mean smoking 1 pack a day for 30 years or 2 packs a day for 15 years). Yearly screening should continue until the smoker has stopped smoking for at least 15 years. Yearly screening should be stopped for people who develop a health problem that would prevent them from having lung cancer treatment.  If you choose to drink alcohol, do not  have more than 2 drinks per day. One drink is considered to be 12 oz (360 mL) of beer, 5 oz (150 mL) of wine, or 1.5 oz (45 mL) of liquor.  Avoid use of street drugs. Do not share needles with anyone. Ask for help if you need support or instructions about stopping the use of drugs.  High blood pressure causes heart disease and increases the risk of stroke. Blood pressure should be checked at least every 1 2  years. Ongoing high blood pressure should be treated with medicines if weight loss and exercise are not effective.  If you are 6 65 years old, ask your health care provider if you should take aspirin to prevent heart disease.  Diabetes screening involves taking a blood sample to check your fasting blood sugar level. This should be done once every 3 years after age 92, if you are at a normal weight and without risk factors for diabetes. Testing should be considered at a younger age or be carried out more frequently if you are overweight and have at least 1 risk factor for diabetes.  Colorectal cancer can be detected and often prevented. Most routine colorectal cancer screening begins at the age of 90 and continues through age 59. However, your health care provider may recommend screening at an earlier age if you have risk factors for colon cancer. On a yearly basis, your health care provider may provide home test kits to check for hidden blood in the stool. A small camera at the end of a tube may be used to directly examine the colon (sigmoidoscopy or colonoscopy) to detect the earliest forms of colorectal cancer. Talk to your health care provider about this at age 54, when routine screening begins. A direct exam of the colon should be repeated every 5 10 years through age 81, unless early forms of pre-cancerous polyps or small growths are found.  People who are at an increased risk for hepatitis B should be screened for this virus. You are considered at high risk for hepatitis B if:  You were born in a country where hepatitis B occurs often. Talk with your health care provider about which countries are considered high-risk.  Your parents were born in a high-risk country and you have not received a shot to protect against hepatitis B (hepatitis B vaccine).  You have HIV or AIDS.  You use needles to inject street drugs.  You live with, or have sex with, someone who has hepatitis B.  You are a man  who has sex with other men (MSM).  You get hemodialysis treatment.  You take certain medicines for conditions like cancer, organ transplantation, and autoimmune conditions.  Hepatitis C blood testing is recommended for all people born from [birth year] through 1967 and any individual with known risk factors for hepatitis C.  Healthy men should no longer receive prostate-specific antigen (PSA) blood tests as part of routine cancer screening. Talk to your health care provider about prostate cancer screening.  Testicular cancer screening is not recommended for adolescents or adult males who have no symptoms. Screening includes self-exam, a health care provider exam, and other screening tests. Consult with your health care provider about any symptoms you have or any concerns you have about testicular cancer.  Practice safe sex. Use condoms and avoid high-risk sexual practices to reduce the spread of sexually transmitted infections (STIs).  Use sunscreen. Apply sunscreen liberally and repeatedly throughout the day. You should seek shade when your shadow is shorter than  you. Protect yourself by wearing long sleeves, pants, a wide-brimmed hat, and sunglasses year round, whenever you are outdoors.  Tell your health care provider of new moles or changes in moles, especially if there is a change in shape or color. Also tell your provider if a mole is larger than the size of a pencil eraser.  A one-time screening for abdominal aortic aneurysm (AAA) and surgical repair of large AAAs by ultrasound is recommended for men aged 6 75 years who are current or former smokers.  Stay current with your vaccines (immunizations). Document Released: 11/06/2007 Document Revised: 02/28/2013 Document Reviewed: 10/05/2010 [facility] Patient Information 2014 [place], [place].

## 2013-08-21 NOTE — Assessment & Plan Note (Signed)
Hx panic attacks - on effexor for many years Increased dose 12/2011 when anxiety-induced symptoms triggered by getting into car symptoms now improved Reduced to 75mg  qd 06/2012 Further reduce now to 37.5mg  qd erx done - lower and eventually off as symptoms allow Also prn xanax provided to help decrease EtOH use (self medicating acknowledged)

## 2013-08-21 NOTE — Telephone Encounter (Signed)
Relevant patient education assigned to patient using Emmi. ° °

## 2013-08-21 NOTE — Progress Notes (Signed)
Subjective:    Patient ID: Danny Johnson, male    DOB: 1951-04-23, 63 y.o.   MRN: [id number]  HPI  patient is here today for annual physical. Patient feels well and has no complaints.  Also reviewed chronic medical issues and interval medical events  Past Medical History  Diagnosis Date  . PVC's (premature ventricular contractions)   . Melanoma 1993  . Anxiety   . Asthma   . Mixed hyperlipidemia   . Essential hypertension, benign   . Arthritis   . Coronary atherosclerosis of native coronary artery     Minimal, LVEF 60%  . Aortic stenosis     Mild to moderate  . Bicuspid aortic valve    Family History  Problem Relation Age of Onset  . Dementia Mother    History  Substance Use Topics  . Smoking status: Current Every Day Smoker -- 1.00 packs/day for 10 years    Types: Cigarettes  . Smokeless tobacco: Not on file  . Alcohol Use: 21.0 oz/week    42 drink(s) per week     Comment: 4-6 drinks daily, beer/wine    Review of Systems  Constitutional: Negative for fever, activity change, appetite change, fatigue and unexpected weight change (intentional weight reduction starting in 2014 with increased activity and improved diet habits).  Respiratory: Negative for cough, chest tightness, shortness of breath and wheezing.   Cardiovascular: Negative for chest pain, palpitations and leg swelling.  Neurological: Negative for dizziness, weakness and headaches.  Psychiatric/Behavioral: Negative for suicidal ideas, hallucinations, sleep disturbance, self-injury, dysphoric mood and decreased concentration. The patient is not nervous/anxious.   All other systems reviewed and are negative.       Objective:   Physical Exam  BP 120/82  Pulse 82  Temp(Src) 98.3 F (36.8 C) (Oral)  Ht 6\' 3"  (1.905 m)  Wt 244 lb 6.4 oz (110.859 kg)  BMI 30.55 kg/m2  SpO2 96% Wt Readings from Last 3 Encounters:  08/21/13 244 lb 6.4 oz (110.859 kg)  07/20/13 255 lb (115.667 kg)  07/17/12 260 lb  (117.935 kg)   Constitutional: he is overweight, but appears well-developed and well-nourished. No distress.  HENT: Head: Normocephalic and atraumatic. Ears: B TMs ok, no erythema or effusion; Nose: Nose normal. Mouth/Throat: Oropharynx is clear and moist. No oropharyngeal exudate.  Eyes: Conjunctivae and EOM are normal. Pupils are equal, round, and reactive to light. No scleral icterus.  Neck: Normal range of motion. Neck supple. No JVD present. No thyromegaly present.  Cardiovascular: Normal rate, regular rhythm and normal heart sounds.  No murmur heard. No BLE edema. Pulmonary/Chest: Effort normal and breath sounds normal. No respiratory distress. he has no wheezes.  Abdominal: Soft. Bowel sounds are normal. he exhibits no distension. There is no tenderness. no masses Musculoskeletal: Normal range of motion, no joint effusions. No gross deformities Neurological: he is alert and oriented to person, place, and time. No cranial nerve deficit. Coordination, balance, strength, speech and gait are normal.  Skin: Skin is warm and dry. No rash noted. No erythema.  Psychiatric: he has a mildly reserved affect but normal mood. behavior is normal. Judgment and thought content normal.  Lab Results  Component Value Date   WBC 6.8 07/05/2013   HGB 16.6 07/05/2013   HCT 50.3 07/05/2013   PLT 281.0 07/05/2013   GLUCOSE 113* 07/05/2013   CHOL 175 07/05/2013   TRIG 47.0 07/05/2013   HDL 52.50 07/05/2013   LDLDIRECT 153.1 06/29/2012   LDLCALC 113* 07/05/2013  ALT 20 07/05/2013   AST 21 07/05/2013   NA 140 07/05/2013   K 4.8 07/05/2013   CL 104 07/05/2013   CREATININE 0.8 07/05/2013   BUN 12 07/05/2013   CO2 28 07/05/2013   TSH 1.38 07/05/2013   PSA 0.35 07/05/2013   HGBA1C 5.8 07/06/2013    No results found.     Assessment & Plan:   CPX/v70.0 - Patient has been counseled on age-appropriate routine health concerns for screening and prevention. These are reviewed and up-to-date. Immunizations are up-to-date or  declined. Labs reviewed.  Problem List Items Addressed This Visit   ANXIETY     Hx panic attacks - on effexor for many years Increased dose 12/2011 when anxiety-induced symptoms triggered by getting into car symptoms now improved Reduced to 75mg  qd 06/2012 Further reduce now to 37.5mg  qd erx done - lower and eventually off as symptoms allow Also prn xanax provided to help decrease EtOH use (self medicating acknowledged)    Relevant Medications      venlafaxine (EFFEXOR-XR) 24 hr capsule   HYPERLIPIDEMIA     Remotely on statin, stopped 2010 due to myalgias resumed low dose atorva 10 06/2012 to reduce CAD risk Tolerating well, lipids improved follow up with cards annually as planned Also encouraged to continue weight loss efforts as ongoing    Relevant Medications      atorvastatin (LIPITOR) tablet   TOBACCO ABUSE     5 minutes today spent counseling patient on unhealthy effects of continued tobacco abuse and encouragement of cessation including medical options available to help the patient quit smoking.      Other Visit Diagnoses   Routine general medical examination at a health care facility    -  Primary

## 2013-08-21 NOTE — Assessment & Plan Note (Signed)
Remotely on statin, stopped 2010 due to myalgias resumed low dose atorva 10 06/2012 to reduce CAD risk Tolerating well, lipids improved follow up with cards annually as planned Also encouraged to continue weight loss efforts as ongoing 

## 2013-08-21 NOTE — Progress Notes (Signed)
Pre visit review using our clinic review tool, if applicable. No additional management support is needed unless otherwise documented below in the visit note. 

## 2013-08-21 NOTE — Assessment & Plan Note (Signed)
5 minutes today spent counseling patient on unhealthy effects of continued tobacco abuse and encouragement of cessation including medical options available to help the patient quit smoking. 

## 2013-09-14 ENCOUNTER — Encounter: Payer: Self-pay | Admitting: Internal Medicine

## 2013-09-25 ENCOUNTER — Encounter: Payer: Self-pay | Admitting: Internal Medicine

## 2013-09-26 ENCOUNTER — Other Ambulatory Visit: Payer: Self-pay | Admitting: Internal Medicine

## 2013-09-26 ENCOUNTER — Other Ambulatory Visit: Payer: Self-pay

## 2013-09-26 NOTE — Telephone Encounter (Signed)
Pt sent email requesting to go back on this dosage. Is this ok...Johny Chess

## 2013-09-26 NOTE — Telephone Encounter (Signed)
Ok erx done

## 2013-12-22 ENCOUNTER — Other Ambulatory Visit: Payer: Self-pay | Admitting: Internal Medicine

## 2013-12-24 ENCOUNTER — Other Ambulatory Visit: Payer: Self-pay

## 2014-01-03 ENCOUNTER — Telehealth: Payer: Self-pay | Admitting: *Deleted

## 2014-01-03 NOTE — Telephone Encounter (Signed)
[facility] Triage Call Report Triage Record Num: [id number] Operator: Hughes Better Patient Name: Aziz Slape Call Date & Time: 12/29/2013 1:08:37PM Patient Phone: [phone] PCP: Gwendolyn Grant Patient Gender: Male PCP Fax : [phone] Patient DOB: 07/22/1950 Practice Name[facility] Reason for Call: Caller: Aysen/Patient; PCP: Gwendolyn Grant (Adults only); CB#: [phone]; reason for call: patient has ordered BP medication: will not arrive until Wednesday (8/12). Medication: Quinipril 20 mg daily. RN accessed patient's EPIC chart and validated prescription: is recorded on med list. RN authorized the following prescription: Quinipril 20 mg, 1 po daily (#7 with NR): this prescription information was left on pharmacy voice mail. Patient. Patient notified to contact pharmacy staff at [facility] at [phone] for prescription pick up time in 30 minutes: patient agreed. Protocol(s) Used: Medication Questions - Adult Recommended Outcome per Protocol: Call Dispensing Pharmacy or Provider Immediately Reason for Outcome: Unable to obtain prescribed medication related to available resources AND situation poses immediate clinical risk Care Advice: ~ 08/

## 2014-02-13 ENCOUNTER — Encounter: Payer: Self-pay | Admitting: Internal Medicine

## 2014-02-13 MED ORDER — VENLAFAXINE HCL ER 75 MG PO CP24: 75.0000 mg | ORAL_CAPSULE | Freq: Every day | ORAL | Status: DC

## 2014-02-13 MED ORDER — ALBUTEROL SULFATE HFA 108 (90 BASE) MCG/ACT IN AERS: 2.0000 | INHALATION_SPRAY | Freq: Four times a day (QID) | RESPIRATORY_TRACT | Status: DC | PRN

## 2014-02-13 MED ORDER — QUINAPRIL HCL 20 MG PO TABS: 20.0000 mg | ORAL_TABLET | Freq: Every day | ORAL | Status: DC

## 2014-02-13 MED ORDER — ATORVASTATIN CALCIUM 10 MG PO TABS: 10.0000 mg | ORAL_TABLET | Freq: Every day | ORAL | Status: DC

## 2014-07-22 ENCOUNTER — Ambulatory Visit (INDEPENDENT_AMBULATORY_CARE_PROVIDER_SITE_OTHER): Payer: Medicare Other | Admitting: Cardiology

## 2014-07-22 ENCOUNTER — Encounter: Payer: Self-pay | Admitting: Cardiology

## 2014-07-22 VITALS — BP 136/74 | HR 90 | Ht 74.0 in | Wt 246.0 lb

## 2014-07-22 DIAGNOSIS — I2510 Atherosclerotic heart disease of native coronary artery without angina pectoris: Secondary | ICD-10-CM

## 2014-07-22 DIAGNOSIS — I350 Nonrheumatic aortic (valve) stenosis: Secondary | ICD-10-CM

## 2014-07-22 DIAGNOSIS — I493 Ventricular premature depolarization: Secondary | ICD-10-CM

## 2014-07-22 DIAGNOSIS — I10 Essential (primary) hypertension: Secondary | ICD-10-CM

## 2014-07-22 NOTE — Progress Notes (Signed)
Cardiology Office Note  Date: 07/22/2014   ID: KIMM UNGARO, DOB 1951-03-16, MRN [id number]  PCP: Gwendolyn Grant, MD  Primary Cardiologist: Rozann Lesches, MD   Chief Complaint  Patient presents with  . Coronary Artery Disease  . Aortic valve disease  . PVCs    History of Present Illness: Danny Johnson is a 64 y.o. male last seen in February 2015. He presents for a routine visit. He continues to work at [employer] course at [employer], plays golf several times a week himself. He denies any change in functional status or stamina, no exertional chest pain. Reports very infrequent palpitations, no dizziness or syncope.  We reviewed his medications, outlined below. He continues to follow with Dr. Asa Lente, has a physical scheduled for April of this year.  Followup tracing is noted below as well as last echocardiogram from 2013.   Past Medical History  Diagnosis Date  . PVC's (premature ventricular contractions)   . Melanoma 1993  . Anxiety   . Asthma   . Mixed hyperlipidemia   . Essential hypertension, benign   . Arthritis   . Coronary atherosclerosis of native coronary artery     Minimal, LVEF 60%  . Aortic stenosis     Mild to moderate  . Bicuspid aortic valve     Past Surgical History  Procedure Laterality Date  . Tonsillectomy      Current Outpatient Prescriptions  Medication Sig Dispense Refill  . albuterol (PROAIR HFA) 108 (90 BASE) MCG/ACT inhaler Inhale 2 puffs into the lungs every 6 (six) hours as needed for wheezing or shortness of breath. 3 Inhaler 0  . ALPRAZolam (XANAX) 0.5 MG tablet Take 1 tablet (0.5 mg total) by mouth 2 (two) times daily as needed for anxiety or sleep. 60 tablet 3  . aspirin 81 MG tablet Take 81 mg by mouth daily.      Marland Kitchen atorvastatin (LIPITOR) 10 MG tablet Take 1 tablet (10 mg total) by mouth daily. 90 tablet 3  . quinapril (ACCUPRIL) 20 MG tablet Take 1 tablet (20 mg total) by mouth daily. 90 tablet 3  . venlafaxine XR  (EFFEXOR-XR) 75 MG 24 hr capsule Take 1 capsule (75 mg total) by mouth daily with breakfast. 90 capsule 3   No current facility-administered medications for this visit.    Allergies:  Review of patient's allergies indicates no known allergies.   Social History: The patient  reports that he has been smoking Cigarettes.  He started smoking about 13 years ago. He has a 10 pack-year smoking history. He has never used smokeless tobacco. He reports that he drinks about 25.2 oz of alcohol per week. He reports that he does not use illicit drugs.   Family History: The patient's family history includes Dementia in his mother.   ROS:  Please see the history of present illness. Otherwise, complete review of systems is positive for none.  All other systems are reviewed and negative.    Physical Exam: VS:  BP 136/74 mmHg  Pulse 90  Ht 6\' 2"  (1.88 m)  Wt 246 lb (111.585 kg)  BMI 31.57 kg/m2, BMI Body mass index is 31.57 kg/(m^2).  Wt Readings from Last 3 Encounters:  07/22/14 246 lb (111.585 kg)  08/21/13 244 lb 6.4 oz (110.859 kg)  07/20/13 255 lb (115.667 kg)     Patient comfortable in no acute distress.  HEENT: Conjunctiva and lids normal, oropharynx clear.  Neck: Supple, no carotid bruits or thyromegaly.  Lungs: Clear to  auscultation.  Cardiac: Regular rate and rhythm, 2/6 systolic murmur at the base, second heart sound preserved, no S3 gallop.  Abdomen: Soft, nontender.  Extremity: No pitting edema.  Skin: Warm and dry. Musko skeletal: No kyphosis. Neuropsychiatric: Alert and oriented x3, affect appropriate.   ECG: ECG is ordered today and reviewed showing sinus rhythm with PVC and R' in V1.  Recent Labwork:    Component Value Date/Time   CHOL 175 07/05/2013 0835   TRIG 47.0 07/05/2013 0835   TRIG 89 10/25/2008   HDL 52.50 07/05/2013 0835   CHOLHDL 3 07/05/2013 0835   VLDL 9.4 07/05/2013 0835   LDLCALC 113* 07/05/2013 0835   LDLDIRECT 153.1 06/29/2012 0929    Other  Studies Reviewed Today:  Echocardiogram 07/13/2011: Study Conclusions  - Left ventricle: The cavity size was normal. There was mild focal basal hypertrophy of the septum. Systolic function was normal. The estimated ejection fraction was in the range of 55% to 60%. Wall motion was normal; there were no regional wall motion abnormalities. - Aortic valve: Mildly to moderately calcified annulus. Moderately thickened, moderately calcified leaflets. Cusp separation was moderately reduced. There was mild to moderate stenosis. Valve area: 1.15cm^2(VTI). Valve area: 1.18cm^2 (Vmax). - Mitral valve: Calcified annulus. - Right ventricle: The cavity size was normal. Wall thickness was mildly increased. - Atrial septum: No defect or patent foramen ovale was identified. Impressions:  - Compared to the prior study performed 11/27/2009, there has been modest interval progression of AS.  ASSESSMENT AND PLAN:  1. Bicuspid aortic valve with aortic stenosis. Stable examination but last echocardiogram was 2013. Followup study to be obtained.  2. History of PVCs, less frequent with reduction in caffeine.  3. Essential hypertension, on Accupril.  4. Minor coronary atherosclerosis by prior testing, asymptomatic.  Current medicines are reviewed at length with the patient today.  The patient does not have concerns regarding medicines.   Orders Placed This Encounter  Procedures  . EKG 12-Lead  . 2D Echocardiogram with contrast    Disposition: FU with me in 1 year.   Signed, Satira Sark, MD, [facility] 07/22/2014 2:30 PM    [facility] [place]. [address] [place], [place] [address] Phone: [phone]; Fax: [phone]

## 2014-07-22 NOTE — Patient Instructions (Signed)
Your physician wants you to follow-up in: 1 year Dr Ferne Reus will receive a reminder letter in the mail two months in advance. If you don't receive a letter, please call our office to schedule the follow-up appointment.   Your physician recommends that you continue on your current medications as directed. Please refer to the Current Medication list given to you today.    Your physician has requested that you have an echocardiogram. Echocardiography is a painless test that uses sound waves to create images of your heart. It provides your doctor with information about the size and shape of your heart and how well your heart's chambers and valves are working. This procedure takes approximately one hour. There are no restrictions for this procedure.     Thank you for choosing [facility] !

## 2014-07-26 ENCOUNTER — Ambulatory Visit (HOSPITAL_COMMUNITY)
Admission: RE | Admit: 2014-07-26 | Discharge: 2014-07-26 | Disposition: A | Discharge status: Home / Self Care | Payer: Medicare Other | Source: Ambulatory Visit | Attending: Cardiology | Admitting: Cardiology

## 2014-07-26 DIAGNOSIS — I350 Nonrheumatic aortic (valve) stenosis: Secondary | ICD-10-CM

## 2014-07-26 DIAGNOSIS — Z720 Tobacco use: Secondary | ICD-10-CM | POA: Diagnosis not present

## 2014-07-26 DIAGNOSIS — E785 Hyperlipidemia, unspecified: Secondary | ICD-10-CM | POA: Insufficient documentation

## 2014-07-26 DIAGNOSIS — I10 Essential (primary) hypertension: Secondary | ICD-10-CM | POA: Diagnosis not present

## 2014-07-26 NOTE — Progress Notes (Signed)
*  PRELIMINARY RESULTS* Echocardiogram 2D Echocardiogram has been performed.  Leavy Cella 07/26/2014, 2:35 PM

## 2014-08-26 ENCOUNTER — Ambulatory Visit (INDEPENDENT_AMBULATORY_CARE_PROVIDER_SITE_OTHER): Payer: Medicare Other | Admitting: Internal Medicine

## 2014-08-26 ENCOUNTER — Encounter: Payer: Self-pay | Admitting: Internal Medicine

## 2014-08-26 VITALS — BP 130/82 | HR 90 | Temp 98.4°F | Resp 16 | Ht 74.0 in | Wt 243.0 lb

## 2014-08-26 DIAGNOSIS — Z23 Encounter for immunization: Secondary | ICD-10-CM | POA: Diagnosis not present

## 2014-08-26 DIAGNOSIS — F411 Generalized anxiety disorder: Secondary | ICD-10-CM

## 2014-08-26 DIAGNOSIS — R739 Hyperglycemia, unspecified: Secondary | ICD-10-CM | POA: Diagnosis not present

## 2014-08-26 DIAGNOSIS — I10 Essential (primary) hypertension: Secondary | ICD-10-CM

## 2014-08-26 DIAGNOSIS — Z720 Tobacco use: Secondary | ICD-10-CM | POA: Diagnosis not present

## 2014-08-26 DIAGNOSIS — Z0000 Encounter for general adult medical examination without abnormal findings: Secondary | ICD-10-CM | POA: Diagnosis not present

## 2014-08-26 DIAGNOSIS — E785 Hyperlipidemia, unspecified: Secondary | ICD-10-CM

## 2014-08-26 DIAGNOSIS — F17200 Nicotine dependence, unspecified, uncomplicated: Secondary | ICD-10-CM

## 2014-08-26 MED ORDER — VENLAFAXINE HCL ER 75 MG PO CP24: 75.0000 mg | ORAL_CAPSULE | Freq: Every day | ORAL | Status: DC

## 2014-08-26 MED ORDER — QUINAPRIL HCL 20 MG PO TABS: 20.0000 mg | ORAL_TABLET | Freq: Every day | ORAL | Status: DC

## 2014-08-26 MED ORDER — ALBUTEROL SULFATE HFA 108 (90 BASE) MCG/ACT IN AERS: 2.0000 | INHALATION_SPRAY | Freq: Four times a day (QID) | RESPIRATORY_TRACT | Status: DC | PRN

## 2014-08-26 MED ORDER — ATORVASTATIN CALCIUM 10 MG PO TABS: 10.0000 mg | ORAL_TABLET | Freq: Every day | ORAL | Status: DC

## 2014-08-26 NOTE — Assessment & Plan Note (Signed)
BP Readings from Last 3 Encounters:  08/26/14 130/82  07/22/14 136/74  08/21/13 120/82   Reduced beta-blocker 12/2011 due to possible overtx contributing to fatigue and near syncope symptoms (now resolved) cont ACEI

## 2014-08-26 NOTE — Progress Notes (Signed)
Subjective:    Patient ID: MONIQUE GIFT, male    DOB: 05-01-1951, 64 y.o.   MRN: [id number]  HPI  Here for medicare wellness  Diet: heart healthy  Physical activity: sedentary Depression/mood screen: negative Hearing: intact to whispered voice Visual acuity: grossly normal, performs annual eye exam  ADLs: capable Fall risk: none Home safety: good Cognitive evaluation: intact to orientation, naming, recall and repetition EOL planning: adv directives, full code/ I agree  I have personally reviewed and have noted 1. The patient's medical and social history 2. Their use of alcohol, tobacco or illicit drugs 3. Their current medications and supplements 4. The patient's functional ability including ADL's, fall risks, home safety risks and hearing or visual impairment. 5. Diet and physical activities 6. Evidence for depression or mood disorders  Also reviewed chronic medical issues and interval events   Past Medical History  Diagnosis Date  . PVC's (premature ventricular contractions)   . Melanoma 1993  . Anxiety   . Asthma   . Mixed hyperlipidemia   . Essential hypertension, benign   . Arthritis   . Coronary atherosclerosis of native coronary artery     Minimal, LVEF 60%  . Aortic stenosis     Mild to moderate  . Bicuspid aortic valve    Family History  Problem Relation Age of Onset  . Dementia Mother    History  Substance Use Topics  . Smoking status: Current Every Day Smoker -- 1.00 packs/day for 10 years    Types: Cigarettes    Start date: 07/21/2001  . Smokeless tobacco: Never Used  . Alcohol Use: 25.2 oz/week    42 Standard drinks or equivalent per week     Comment: 4-6 drinks daily, beer/wine    Review of Systems  Constitutional: Negative for fever, activity change, appetite change, fatigue and unexpected weight change.  Respiratory: Negative for cough, chest tightness, shortness of breath and wheezing.   Cardiovascular: Negative for chest pain,  palpitations and leg swelling.  Neurological: Negative for dizziness, weakness and headaches.  Psychiatric/Behavioral: Negative for dysphoric mood. The patient is not nervous/anxious.   All other systems reviewed and are negative.      Objective:    Physical Exam  Constitutional: He is oriented to person, place, and time. He appears well-developed and well-nourished. No distress.  HENT:  Head: Normocephalic and atraumatic.  Nose: Nose normal.  Mouth/Throat: Oropharynx is clear and moist.  Hearing grossly normal.  Eyes: Conjunctivae and EOM are normal. Pupils are equal, round, and reactive to light. No scleral icterus.  Neck: Normal range of motion. Neck supple. No JVD present. No thyromegaly present.  Cardiovascular: Normal rate, regular rhythm, normal heart sounds and intact distal pulses.  Exam reveals no friction rub.   No murmur heard. No edema.  Pulmonary/Chest: Effort normal and breath sounds normal. No respiratory distress. He has no wheezes.  Abdominal: Soft. Bowel sounds are normal. He exhibits no distension and no mass. There is no tenderness. There is no guarding.  Genitourinary:  defer  Musculoskeletal: Normal range of motion. He exhibits no edema or tenderness.  Lymphadenopathy:    He has no cervical adenopathy.  Neurological: He is alert and oriented to person, place, and time. He has normal reflexes. No cranial nerve deficit.  Skin: Skin is warm and dry. No rash noted. No erythema.  Psychiatric: He has a normal mood and affect. His behavior is normal. Thought content normal.    BP 130/82 mmHg  Pulse 90  Temp(Src) 98.4 F (36.9 C) (Oral)  Resp 16  Ht 6\' 2"  (1.88 m)  Wt 243 lb (110.224 kg)  BMI 31.19 kg/m2  SpO2 95% Wt Readings from Last 3 Encounters:  08/26/14 243 lb (110.224 kg)  07/22/14 246 lb (111.585 kg)  08/21/13 244 lb 6.4 oz (110.859 kg)    Lab Results  Component Value Date   WBC 6.8 07/05/2013   HGB 16.6 07/05/2013   HCT 50.3 07/05/2013    PLT 281.0 07/05/2013   GLUCOSE 113* 07/05/2013   CHOL 175 07/05/2013   TRIG 47.0 07/05/2013   HDL 52.50 07/05/2013   LDLDIRECT 153.1 06/29/2012   LDLCALC 113* 07/05/2013   ALT 20 07/05/2013   AST 21 07/05/2013   NA 140 07/05/2013   K 4.8 07/05/2013   CL 104 07/05/2013   CREATININE 0.8 07/05/2013   BUN 12 07/05/2013   CO2 28 07/05/2013   TSH 1.38 07/05/2013   PSA 0.35 07/05/2013   HGBA1C 5.8 07/06/2013    No results found.     Assessment & Plan:   AWV/z00.00- Today patient counseled on age appropriate routine health concerns for screening and prevention, each reviewed and up to date or declined. Immunizations reviewed and up to date or declined. Labs ordered and reviewed. Risk factors for depression reviewed and negative. Hearing function and visual acuity are intact. ADLs screened and addressed as needed. Functional ability and level of safety reviewed and appropriate. Education, counseling and referrals performed based on assessed risks today. Patient provided with a copy of personalized plan for preventive services.  Problem List Items Addressed This Visit    Anxiety state    Hx panic attacks - on effexor for many years Increased dose 12/2011 when anxiety-induced symptoms triggered by getting into car symptoms now improved Reduced to 75mg  qd 06/2012 Further reduced to 37.5mg  qd 07/2013 eventually off as symptoms allow Also prn xanax provided to help decrease EtOH use (self medicating acknowledged)      Relevant Medications   venlafaxine (EFFEXOR-XR) 24 hr capsule   Other Relevant Orders   CBC with Differential/Platelet   Hepatic function panel   TSH   Essential hypertension    BP Readings from Last 3 Encounters:  08/26/14 130/82  07/22/14 136/74  08/21/13 120/82   Reduced beta-blocker 12/2011 due to possible overtx contributing to fatigue and near syncope symptoms (now resolved) cont ACEI      Relevant Medications   atorvastatin (LIPITOR) tablet   quinapril  (ACCUPRIL) tablet   Other Relevant Orders   Basic metabolic panel   Urinalysis, Routine w reflex microscopic   Hyperglycemia    Check A1c The patient is asked to make an attempt to improve diet and exercise patterns to aid in medical management of this problem. Lab Results  Component Value Date   HGBA1C 5.8 07/06/2013         Relevant Orders   Basic metabolic panel   Hemoglobin A1c   Hyperlipidemia    Remotely on statin, stopped 2010 due to myalgias resumed low dose atorva 10 06/2012 to reduce CAD risk Tolerating well, lipids improved follow up with cards annually as planned Also encouraged to continue weight loss efforts as ongoing      Relevant Medications   atorvastatin (LIPITOR) tablet   quinapril (ACCUPRIL) tablet   Other Relevant Orders   Lipid panel   TOBACCO ABUSE    5 minutes today spent counseling patient on unhealthy effects of continued tobacco abuse and encouragement of cessation including medical options  available to help the patient quit smoking.       Other Visit Diagnoses    Routine general medical examination at a health care facility    -  Primary    Need for prophylactic vaccination against Streptococcus pneumoniae (pneumococcus)        Relevant Orders    Pneumococcal conjugate vaccine 13-valent (Completed)        Gwendolyn Grant, MD

## 2014-08-26 NOTE — Assessment & Plan Note (Signed)
Hx panic attacks - on effexor for many years Increased dose 12/2011 when anxiety-induced symptoms triggered by getting into car symptoms now improved Reduced to 75mg  qd 06/2012 Further reduced to 37.5mg  qd 07/2013 eventually off as symptoms allow Also prn xanax provided to help decrease EtOH use (self medicating acknowledged)

## 2014-08-26 NOTE — Assessment & Plan Note (Signed)
Check A1c The patient is asked to make an attempt to improve diet and exercise patterns to aid in medical management of this problem. Lab Results  Component Value Date   HGBA1C 5.8 07/06/2013

## 2014-08-26 NOTE — Patient Instructions (Addendum)
It was good to see you today.  We have reviewed your prior records including labs and tests today  Health Maintenance reviewed -Prevnar 13 pneumonia vaccine updated today. All other recommended immunizations and age-appropriate screenings are up-to-date.  Test(s) ordered today. Please return Thursday morning when you're fasting for these test. Your results will be released to MyChart (or called to you) after review, usually within 72hours after test completion. If any changes need to be made, you will be notified at that same time.  Medications reviewed and updated, no changes recommended at this time.  Continue to think about giving up cigarettes! Use nicotine gum, nicotine patches or electronic cigarettes. If you're interested in medication to help you quit, please call  - let me know how I can help!  Please schedule followup in 12 months for annual exam and labs, call sooner if problems.  Health Maintenance A healthy lifestyle and preventative care can promote health and wellness.  Maintain regular health, dental, and eye exams.  Eat a healthy diet. Foods like vegetables, fruits, whole grains, low-fat dairy products, and lean protein foods contain the nutrients you need and are low in calories. Decrease your intake of foods high in solid fats, added sugars, and salt. Get information about a proper diet from your health care provider, if necessary.  Regular physical exercise is one of the most important things you can do for your health. Most adults should get at least 150 minutes of moderate-intensity exercise (any activity that increases your heart rate and causes you to sweat) each week. In addition, most adults need muscle-strengthening exercises on 2 or more days a week.   Maintain a healthy weight. The body mass index (BMI) is a screening tool to identify possible weight problems. It provides an estimate of body fat based on height and weight. Your health care provider can find your  BMI and can help you achieve or maintain a healthy weight. For males 20 years and older:  A BMI below 18.5 is considered underweight.  A BMI of 18.5 to 24.9 is normal.  A BMI of 25 to 29.9 is considered overweight.  A BMI of 30 and above is considered obese.  Maintain normal blood lipids and cholesterol by exercising and minimizing your intake of saturated fat. Eat a balanced diet with plenty of fruits and vegetables. Blood tests for lipids and cholesterol should begin at age 66 and be repeated every 5 years. If your lipid or cholesterol levels are high, you are over age 63, or you are at high risk for heart disease, you may need your cholesterol levels checked more frequently.Ongoing high lipid and cholesterol levels should be treated with medicines if diet and exercise are not working.  If you smoke, find out from your health care provider how to quit. If you do not use tobacco, do not start.  Lung cancer screening is recommended for adults aged 15-80 years who are at high risk for developing lung cancer because of a history of smoking. A yearly low-dose CT scan of the lungs is recommended for people who have at least a 30-pack-year history of smoking and are current smokers or have quit within the past 15 years. A pack year of smoking is smoking an average of 1 pack of cigarettes a day for 1 year (for example, a 30-pack-year history of smoking could mean smoking 1 pack a day for 30 years or 2 packs a day for 15 years). Yearly screening should continue until the smoker has  stopped smoking for at least 15 years. Yearly screening should be stopped for people who develop a health problem that would prevent them from having lung cancer treatment.  If you choose to drink alcohol, do not have more than 2 drinks per day. One drink is considered to be 12 oz (360 mL) of beer, 5 oz (150 mL) of wine, or 1.5 oz (45 mL) of liquor.  Avoid the use of street drugs. Do not share needles with anyone. Ask for help  if you need support or instructions about stopping the use of drugs.  High blood pressure causes heart disease and increases the risk of stroke. Blood pressure should be checked at least every 1-2 years. Ongoing high blood pressure should be treated with medicines if weight loss and exercise are not effective.  If you are 50-49 years old, ask your health care provider if you should take aspirin to prevent heart disease.  Diabetes screening involves taking a blood sample to check your fasting blood sugar level. This should be done once every 3 years after age 35 if you are at a normal weight and without risk factors for diabetes. Testing should be considered at a younger age or be carried out more frequently if you are overweight and have at least 1 risk factor for diabetes.  Colorectal cancer can be detected and often prevented. Most routine colorectal cancer screening begins at the age of 48 and continues through age 32. However, your health care provider may recommend screening at an earlier age if you have risk factors for colon cancer. On a yearly basis, your health care provider may provide home test kits to check for hidden blood in the stool. A small camera at the end of a tube may be used to directly examine the colon (sigmoidoscopy or colonoscopy) to detect the earliest forms of colorectal cancer. Talk to your health care provider about this at age 64 when routine screening begins. A direct exam of the colon should be repeated every 5-10 years through age 38, unless early forms of precancerous polyps or small growths are found.  People who are at an increased risk for hepatitis B should be screened for this virus. You are considered at high risk for hepatitis B if:  You were born in a country where hepatitis B occurs often. Talk with your health care provider about which countries are considered high risk.  Your parents were born in a high-risk country and you have not received a shot to  protect against hepatitis B (hepatitis B vaccine).  You have HIV or AIDS.  You use needles to inject street drugs.  You live with, or have sex with, someone who has hepatitis B.  You are a man who has sex with other men (MSM).  You get hemodialysis treatment.  You take certain medicines for conditions like cancer, organ transplantation, and autoimmune conditions.  Hepatitis C blood testing is recommended for all people born from [birth year] through 1967 and any individual with known risk factors for hepatitis C.  Healthy men should no longer receive prostate-specific antigen (PSA) blood tests as part of routine cancer screening. Talk to your health care provider about prostate cancer screening.  Testicular cancer screening is not recommended for adolescents or adult males who have no symptoms. Screening includes self-exam, a health care provider exam, and other screening tests. Consult with your health care provider about any symptoms you have or any concerns you have about testicular cancer.  Practice safe sex. Use  condoms and avoid high-risk sexual practices to reduce the spread of sexually transmitted infections (STIs).  You should be screened for STIs, including gonorrhea and chlamydia if:  You are sexually active and are younger than 24 years.  You are older than 24 years, and your health care provider tells you that you are at risk for this type of infection.  Your sexual activity has changed since you were last screened, and you are at an increased risk for chlamydia or gonorrhea. Ask your health care provider if you are at risk.  If you are at risk of being infected with HIV, it is recommended that you take a prescription medicine daily to prevent HIV infection. This is called pre-exposure prophylaxis (PrEP). You are considered at risk if:  You are a man who has sex with other men (MSM).  You are a heterosexual man who is sexually active with multiple partners.  You take drugs by  injection.  You are sexually active with a partner who has HIV.  Talk with your health care provider about whether you are at high risk of being infected with HIV. If you choose to begin PrEP, you should first be tested for HIV. You should then be tested every 3 months for as long as you are taking PrEP.  Use sunscreen. Apply sunscreen liberally and repeatedly throughout the day. You should seek shade when your shadow is shorter than you. Protect yourself by wearing long sleeves, pants, a wide-brimmed hat, and sunglasses year round whenever you are outdoors.  Tell your health care provider of new moles or changes in moles, especially if there is a change in shape or color. Also, tell your health care provider if a mole is larger than the size of a pencil eraser.  A one-time screening for abdominal aortic aneurysm (AAA) and surgical repair of large AAAs by ultrasound is recommended for men aged 43-75 years who are current or former smokers.  Stay current with your vaccines (immunizations). Document Released: 11/06/2007 Document Revised: 05/15/2013 Document Reviewed: 10/05/2010 [facility] Patient Information 2015 [place], [place]. This information is not intended to replace advice given to you by your health care provider. Make sure you discuss any questions you have with your health care provider.

## 2014-08-26 NOTE — Assessment & Plan Note (Signed)
5 minutes today spent counseling patient on unhealthy effects of continued tobacco abuse and encouragement of cessation including medical options available to help the patient quit smoking. 

## 2014-08-26 NOTE — Progress Notes (Signed)
Pre visit review using our clinic review tool, if applicable. No additional management support is needed unless otherwise documented below in the visit note. 

## 2014-08-26 NOTE — Assessment & Plan Note (Signed)
Remotely on statin, stopped 2010 due to myalgias resumed low dose atorva 10 06/2012 to reduce CAD risk Tolerating well, lipids improved follow up with cards annually as planned Also encouraged to continue weight loss efforts as ongoing

## 2014-08-27 ENCOUNTER — Telehealth: Payer: Self-pay | Admitting: Internal Medicine

## 2014-08-27 NOTE — Telephone Encounter (Signed)
emmi emailed °

## 2014-08-29 ENCOUNTER — Other Ambulatory Visit (INDEPENDENT_AMBULATORY_CARE_PROVIDER_SITE_OTHER): Payer: Medicare Other

## 2014-08-29 DIAGNOSIS — I10 Essential (primary) hypertension: Secondary | ICD-10-CM | POA: Diagnosis not present

## 2014-08-29 DIAGNOSIS — R739 Hyperglycemia, unspecified: Secondary | ICD-10-CM | POA: Diagnosis not present

## 2014-08-29 DIAGNOSIS — F411 Generalized anxiety disorder: Secondary | ICD-10-CM

## 2014-08-29 DIAGNOSIS — E785 Hyperlipidemia, unspecified: Secondary | ICD-10-CM | POA: Diagnosis not present

## 2014-08-29 LAB — HEPATIC FUNCTION PANEL
ALT: 25 U/L (ref 0–53)
AST: 22 U/L (ref 0–37)
Albumin: 4.2 g/dL (ref 3.5–5.2)
Alkaline Phosphatase: 81 U/L (ref 39–117)
BILIRUBIN TOTAL: 0.7 mg/dL (ref 0.2–1.2)
Bilirubin, Direct: 0.2 mg/dL (ref 0.0–0.3)
TOTAL PROTEIN: 6.8 g/dL (ref 6.0–8.3)

## 2014-08-29 LAB — LIPID PANEL
CHOL/HDL RATIO: 3
Cholesterol: 146 mg/dL (ref 0–200)
HDL: 58 mg/dL (ref >39.00)
LDL Cholesterol: 78 mg/dL (ref 0–99)
NonHDL: 88
TRIGLYCERIDES: 51 mg/dL (ref 0.0–149.0)
VLDL: 10.2 mg/dL (ref 0.0–40.0)

## 2014-08-29 LAB — CBC WITH DIFFERENTIAL/PLATELET
BASOS PCT: 0.9 % (ref 0.0–3.0)
Basophils Absolute: 0 10*3/uL (ref 0.0–0.1)
EOS PCT: 8.4 % — AB (ref 0.0–5.0)
Eosinophils Absolute: 0.4 10*3/uL (ref 0.0–0.7)
HCT: 48.5 % (ref 39.0–52.0)
Hemoglobin: 16.9 g/dL (ref 13.0–17.0)
Lymphocytes Relative: 31.4 % (ref 12.0–46.0)
Lymphs Abs: 1.6 10*3/uL (ref 0.7–4.0)
MCHC: 34.8 g/dL (ref 30.0–36.0)
MCV: 96.1 fl (ref 78.0–100.0)
Monocytes Absolute: 0.5 10*3/uL (ref 0.1–1.0)
Monocytes Relative: 10.1 % (ref 3.0–12.0)
NEUTROS PCT: 49.2 % (ref 43.0–77.0)
Neutro Abs: 2.5 10*3/uL (ref 1.4–7.7)
Platelets: 251 10*3/uL (ref 150.0–400.0)
RBC: 5.05 Mil/uL (ref 4.22–5.81)
RDW: 13.7 % (ref 11.5–15.5)
WBC: 5.1 10*3/uL (ref 4.0–10.5)

## 2014-08-29 LAB — BASIC METABOLIC PANEL
BUN: 13 mg/dL (ref 6–23)
CO2: 26 mEq/L (ref 19–32)
CREATININE: 0.88 mg/dL (ref 0.40–1.50)
Calcium: 9 mg/dL (ref 8.4–10.5)
Chloride: 105 mEq/L (ref 96–112)
GFR: 92.19 mL/min (ref >60.00)
GLUCOSE: 109 mg/dL — AB (ref 70–99)
POTASSIUM: 4.1 meq/L (ref 3.5–5.1)
Sodium: 137 mEq/L (ref 135–145)

## 2014-08-29 LAB — URINALYSIS, ROUTINE W REFLEX MICROSCOPIC
Bilirubin Urine: NEGATIVE
HGB URINE DIPSTICK: NEGATIVE
Ketones, ur: NEGATIVE
Leukocytes, UA: NEGATIVE
Nitrite: NEGATIVE
RBC / HPF: NONE SEEN (ref 0–?)
SPECIFIC GRAVITY, URINE: 1.015 (ref 1.000–1.030)
Total Protein, Urine: NEGATIVE
URINE GLUCOSE: NEGATIVE
Urobilinogen, UA: 0.2 (ref 0.0–1.0)
WBC UA: NONE SEEN (ref 0–?)
pH: 7 (ref 5.0–8.0)

## 2014-08-29 LAB — TSH: TSH: 1.56 u[IU]/mL (ref 0.35–4.50)

## 2014-08-29 LAB — HEMOGLOBIN A1C: Hgb A1c MFr Bld: 5.6 % (ref 4.6–6.5)

## 2014-09-02 ENCOUNTER — Encounter: Payer: Self-pay | Admitting: Internal Medicine

## 2014-09-02 DIAGNOSIS — Z125 Encounter for screening for malignant neoplasm of prostate: Secondary | ICD-10-CM

## 2014-09-05 ENCOUNTER — Other Ambulatory Visit (INDEPENDENT_AMBULATORY_CARE_PROVIDER_SITE_OTHER): Payer: Medicare Other

## 2014-09-05 DIAGNOSIS — Z125 Encounter for screening for malignant neoplasm of prostate: Secondary | ICD-10-CM | POA: Diagnosis not present

## 2014-09-05 LAB — PSA: PSA: 0.36 ng/mL (ref 0.10–4.00)

## 2014-09-10 ENCOUNTER — Encounter: Payer: Self-pay | Admitting: Internal Medicine

## 2014-09-10 DIAGNOSIS — F32A Depression, unspecified: Secondary | ICD-10-CM

## 2014-09-10 DIAGNOSIS — F329 Major depressive disorder, single episode, unspecified: Secondary | ICD-10-CM

## 2014-09-11 MED ORDER — MIRTAZAPINE 7.5 MG PO TABS: 7.5000 mg | ORAL_TABLET | Freq: Every day | ORAL | Status: DC

## 2014-09-11 NOTE — Telephone Encounter (Signed)
I sent Remeron rx to [facility] - please check with pt if he prefers it go to [facility] - and ok to re-send if needed Thanks!

## 2015-02-23 ENCOUNTER — Encounter: Payer: Self-pay | Admitting: Internal Medicine

## 2015-02-24 MED ORDER — SILDENAFIL CITRATE 100 MG PO TABS: 50.0000 mg | ORAL_TABLET | Freq: Every day | ORAL | Status: DC | PRN

## 2015-05-18 ENCOUNTER — Encounter: Payer: Self-pay | Admitting: Internal Medicine

## 2015-09-11 ENCOUNTER — Encounter: Payer: Self-pay | Admitting: Cardiology

## 2015-09-11 ENCOUNTER — Ambulatory Visit (INDEPENDENT_AMBULATORY_CARE_PROVIDER_SITE_OTHER): Payer: Medicare Other | Admitting: Cardiology

## 2015-09-11 VITALS — BP 138/84 | HR 94 | Ht 73.0 in | Wt 251.0 lb

## 2015-09-11 DIAGNOSIS — I493 Ventricular premature depolarization: Secondary | ICD-10-CM | POA: Diagnosis not present

## 2015-09-11 DIAGNOSIS — E785 Hyperlipidemia, unspecified: Secondary | ICD-10-CM | POA: Diagnosis not present

## 2015-09-11 DIAGNOSIS — I350 Nonrheumatic aortic (valve) stenosis: Secondary | ICD-10-CM | POA: Diagnosis not present

## 2015-09-11 DIAGNOSIS — I2510 Atherosclerotic heart disease of native coronary artery without angina pectoris: Secondary | ICD-10-CM | POA: Diagnosis not present

## 2015-09-11 NOTE — Progress Notes (Signed)
Cardiology Office Note  Date: 09/11/2015   ID: GRIMM BASSANI, DOB [birth year], MRN [id number]  PCP: Currently in transition from Dr. Asa Lente  Primary Cardiologist: Rozann Lesches, MD   Chief Complaint  Patient presents with  . Aortic Stenosis    History of Present Illness: Danny Johnson is a 65 y.o. male last seen in February 2016. He presents for a routine follow-up visit. Since last visit he does not report any major functional decline. He reports no exertional chest pain. Has a rare sense of palpitation, one episode of dizziness since I saw him, no syncope.  Echocardiogram from last year is reviewed below. He had evidence of mild to moderate aortic stenosis with known bicuspid aortic valve at that time. We discussed the results today and plan for follow-up study prior to next year's visit.  I reviewed his medications. He continues on aspirin, Lipitor, and Accupril. Lipid panel from last year showed LDL 113 up from 97. He will be having repeat lab work with a new PCP in the near future.  Past Medical History  Diagnosis Date  . PVC's (premature ventricular contractions)   . Melanoma ([place]) 1993  . Anxiety   . Asthma   . Mixed hyperlipidemia   . Essential hypertension, benign   . Arthritis   . Coronary atherosclerosis of native coronary artery     Minimal, LVEF 60%  . Aortic stenosis     Mild to moderate  . Bicuspid aortic valve     Past Surgical History  Procedure Laterality Date  . Tonsillectomy      Current Outpatient Prescriptions  Medication Sig Dispense Refill  . albuterol (PROAIR HFA) 108 (90 BASE) MCG/ACT inhaler Inhale 2 puffs into the lungs every 6 (six) hours as needed for wheezing or shortness of breath. 3 Inhaler 2  . aspirin 81 MG tablet Take 81 mg by mouth daily.      Marland Kitchen atorvastatin (LIPITOR) 10 MG tablet Take 1 tablet (10 mg total) by mouth daily. 90 tablet 3  . quinapril (ACCUPRIL) 20 MG tablet Take 1 tablet (20 mg total) by mouth daily. 90 tablet 3    . sildenafil (VIAGRA) 100 MG tablet Take 0.5-1 tablets (50-100 mg total) by mouth daily as needed for erectile dysfunction. 5 tablet 3  . venlafaxine XR (EFFEXOR-XR) 75 MG 24 hr capsule Take 1 capsule (75 mg total) by mouth daily with breakfast. 90 capsule 3   No current facility-administered medications for this visit.   Allergies:  Review of patient's allergies indicates no known allergies.   Social History: The patient  reports that he has been smoking Cigarettes.  He started smoking about 14 years ago. He has a 10 pack-year smoking history. He has never used smokeless tobacco. He reports that he drinks about 25.2 oz of alcohol per week. He reports that he does not use illicit drugs.   Family History: The patient's family history includes Dementia in his mother.   ROS:  Please see the history of present illness. Otherwise, complete review of systems is positive for depression symptoms.  All other systems are reviewed and negative.   Physical Exam: VS:  BP 138/84 mmHg  Pulse 94  Ht 6\' 1"  (1.854 m)  Wt 251 lb (113.853 kg)  BMI 33.12 kg/m2  SpO2 96%, BMI Body mass index is 33.12 kg/(m^2).  Wt Readings from Last 3 Encounters:  09/11/15 251 lb (113.853 kg)  08/26/14 243 lb (110.224 kg)  07/22/14 246 lb (111.585 kg)  Patient comfortable in no acute distress.  HEENT: Conjunctiva and lids normal, oropharynx clear.  Neck: Supple, no carotid bruits or thyromegaly.  Lungs: Clear to auscultation.  Cardiac: Regular rate and rhythm, 2/6 systolic murmur at the base, second heart sound preserved, no S3 gallop.  Abdomen: Soft, nontender.  Extremity: No pitting edema.  Skin: Warm and dry. [place] skeletal: No kyphosis. Neuropsychiatric: Alert and oriented x3, affect appropriate.  ECG: I personally reviewed the prior tracing from 07/22/2014 which showed sinus rhythm with PVCs, possible left atrial enlargement, R' in V1.  Recent Labwork: No results found for requested labs within last  365 days.     Component Value Date/Time   CHOL 146 08/29/2014 0734   TRIG 51.0 08/29/2014 0734   TRIG 89 10/25/2008   HDL 58.00 08/29/2014 0734   CHOLHDL 3 08/29/2014 0734   VLDL 10.2 08/29/2014 0734   LDLCALC 78 08/29/2014 0734   LDLDIRECT 153.1 06/29/2012 0929    Other Studies Reviewed Today:  Echocardiogram 07/26/2014: Study Conclusions  - Left ventricle: The cavity size was normal. There was mild concentric hypertrophy. Systolic function was normal. The estimated ejection fraction was in the range of 60% to 65%. Wall motion was normal; there were no regional wall motion abnormalities. Doppler parameters are consistent with abnormal left ventricular relaxation (grade 1 diastolic dysfunction). - Aortic valve: Mild to moderate aortic stenosis (morphologically, it appears to be closer to moderate). Peak velocity 2.85 m/s. Moderately calcified annulus. Mildly thickened, moderately calcified leaflets. Cusp separation was reduced. Valve mobility was restricted. There was mild regurgitation. Mean gradient (S): 21 mm Hg. Valve area (VTI): 1.09 cm^2. Valve area (Vmax): 1.09 cm^2.  Assessment and Plan:  1. Bicuspid aortic valve with mild to moderate aortic stenosis. Symptomatically stable, no change in cardiac murmur. We will plan a follow-up echocardiogram just prior to next year's visit.  2. Minor coronary atherosclerosis by cardiac catheterization. Asymptomatic in terms of angina. Continue aspirin and statin.  3. Prior history of PVCs, symptomatic. This has been relatively quiescent. He did not tolerate beta blocker previously due to fatigue.  4. Hyperlipidemia, on Lipitor. Due for follow-up lab work with new PCP soon.  Current medicines were reviewed with the patient today.   Orders Placed This Encounter  Procedures  . Echocardiogram    Disposition: FU with me in 1 year.   Signed, Satira Sark, MD, [facility] 09/11/2015 9:33 AM    [facility]. [address], [place], [place] [address] Phone: [phone]; Fax: [phone]

## 2015-09-11 NOTE — Patient Instructions (Signed)
Your physician wants you to follow-up in:  1 year with Dr Ferne Reus will receive a reminder letter in the mail two months in advance. If you don't receive a letter, please call our office to schedule the follow-up appointment.   Your physician has requested that you have an echocardiogram JUST BEFORE YOUR NEXT VISIT IN 1 YEAR. Echocardiography is a painless test that uses sound waves to create images of your heart. It provides your doctor with information about the size and shape of your heart and how well your heart's chambers and valves are working. This procedure takes approximately one hour. There are no restrictions for this procedure.   Your physician recommends that you continue on your current medications as directed. Please refer to the Current Medication list given to you today.    If you need a refill on your cardiac medications before your next appointment, please call your pharmacy.    Thank you for choosing [facility] !

## 2015-09-29 ENCOUNTER — Encounter: Payer: Self-pay | Admitting: Internal Medicine

## 2015-10-02 DIAGNOSIS — Z808 Family history of malignant neoplasm of other organs or systems: Secondary | ICD-10-CM | POA: Diagnosis not present

## 2015-10-02 DIAGNOSIS — I781 Nevus, non-neoplastic: Secondary | ICD-10-CM | POA: Diagnosis not present

## 2015-10-02 DIAGNOSIS — L821 Other seborrheic keratosis: Secondary | ICD-10-CM | POA: Diagnosis not present

## 2015-10-02 DIAGNOSIS — Z85820 Personal history of malignant melanoma of skin: Secondary | ICD-10-CM | POA: Diagnosis not present

## 2015-10-02 DIAGNOSIS — L814 Other melanin hyperpigmentation: Secondary | ICD-10-CM | POA: Diagnosis not present

## 2015-10-03 ENCOUNTER — Encounter: Payer: Self-pay | Admitting: Internal Medicine

## 2015-10-03 ENCOUNTER — Ambulatory Visit (INDEPENDENT_AMBULATORY_CARE_PROVIDER_SITE_OTHER): Payer: Medicare Other | Admitting: Internal Medicine

## 2015-10-03 VITALS — BP 122/80 | HR 95 | Temp 98.5°F | Ht 73.0 in | Wt 255.0 lb

## 2015-10-03 DIAGNOSIS — E785 Hyperlipidemia, unspecified: Secondary | ICD-10-CM

## 2015-10-03 DIAGNOSIS — I10 Essential (primary) hypertension: Secondary | ICD-10-CM

## 2015-10-03 DIAGNOSIS — F411 Generalized anxiety disorder: Secondary | ICD-10-CM

## 2015-10-03 DIAGNOSIS — Z125 Encounter for screening for malignant neoplasm of prostate: Secondary | ICD-10-CM

## 2015-10-03 DIAGNOSIS — Z1159 Encounter for screening for other viral diseases: Secondary | ICD-10-CM

## 2015-10-03 DIAGNOSIS — R739 Hyperglycemia, unspecified: Secondary | ICD-10-CM

## 2015-10-03 DIAGNOSIS — Z0000 Encounter for general adult medical examination without abnormal findings: Secondary | ICD-10-CM

## 2015-10-03 DIAGNOSIS — Z23 Encounter for immunization: Secondary | ICD-10-CM | POA: Diagnosis not present

## 2015-10-03 NOTE — Assessment & Plan Note (Signed)
Stable, controlled continue effexor at current dose

## 2015-10-03 NOTE — Patient Instructions (Signed)
Test(s) ordered today. Your results will be released to MyChart (or called to you) after review, usually within 72hours after test completion. If any changes need to be made, you will be notified at that same time.  All other Health Maintenance issues reviewed.   All recommended immunizations and age-appropriate screenings are up-to-date or discussed.  Pneumonia vaccine administered today.   Medications reviewed and updated.  No changes recommended at this time.   Please followup in one year  Health Maintenance, Male A healthy lifestyle and preventative care can promote health and wellness.  Maintain regular health, dental, and eye exams.  Eat a healthy diet. Foods like vegetables, fruits, whole grains, low-fat dairy products, and lean protein foods contain the nutrients you need and are low in calories. Decrease your intake of foods high in solid fats, added sugars, and salt. Get information about a proper diet from your health care provider, if necessary.  Regular physical exercise is one of the most important things you can do for your health. Most adults should get at least 150 minutes of moderate-intensity exercise (any activity that increases your heart rate and causes you to sweat) each week. In addition, most adults need muscle-strengthening exercises on 2 or more days a week.   Maintain a healthy weight. The body mass index (BMI) is a screening tool to identify possible weight problems. It provides an estimate of body fat based on height and weight. Your health care provider can find your BMI and can help you achieve or maintain a healthy weight. For males 20 years and older:  A BMI below 18.5 is considered underweight.  A BMI of 18.5 to 24.9 is normal.  A BMI of 25 to 29.9 is considered overweight.  A BMI of 30 and above is considered obese.  Maintain normal blood lipids and cholesterol by exercising and minimizing your intake of saturated fat. Eat a balanced diet with  plenty of fruits and vegetables. Blood tests for lipids and cholesterol should begin at age 20 and be repeated every 5 years. If your lipid or cholesterol levels are high, you are over age 50, or you are at high risk for heart disease, you may need your cholesterol levels checked more frequently.Ongoing high lipid and cholesterol levels should be treated with medicines if diet and exercise are not working.  If you smoke, find out from your health care provider how to quit. If you do not use tobacco, do not start.  Lung cancer screening is recommended for adults aged 55-80 years who are at high risk for developing lung cancer because of a history of smoking. A yearly low-dose CT scan of the lungs is recommended for people who have at least a 30-pack-year history of smoking and are current smokers or have quit within the past 15 years. A pack year of smoking is smoking an average of 1 pack of cigarettes a day for 1 year (for example, a 30-pack-year history of smoking could mean smoking 1 pack a day for 30 years or 2 packs a day for 15 years). Yearly screening should continue until the smoker has stopped smoking for at least 15 years. Yearly screening should be stopped for people who develop a health problem that would prevent them from having lung cancer treatment.  If you choose to drink alcohol, do not have more than 2 drinks per day. One drink is considered to be 12 oz (360 mL) of beer, 5 oz (150 mL) of wine, or 1.5 oz (45 mL)   of liquor.  Avoid the use of street drugs. Do not share needles with anyone. Ask for help if you need support or instructions about stopping the use of drugs.  High blood pressure causes heart disease and increases the risk of stroke. High blood pressure is more likely to develop in:  People who have blood pressure in the end of the normal range (100-139/85-89 mm Hg).  People who are overweight or obese.  People who are African American.  If you are 18-39 years of age, have  your blood pressure checked every 3-5 years. If you are 40 years of age or older, have your blood pressure checked every year. You should have your blood pressure measured twice--once when you are at a hospital or clinic, and once when you are not at a hospital or clinic. Record the average of the two measurements. To check your blood pressure when you are not at a hospital or clinic, you can use:  An automated blood pressure machine at a pharmacy.  A home blood pressure monitor.  If you are 45-79 years old, ask your health care provider if you should take aspirin to prevent heart disease.  Diabetes screening involves taking a blood sample to check your fasting blood sugar level. This should be done once every 3 years after age 45 if you are at a normal weight and without risk factors for diabetes. Testing should be considered at a younger age or be carried out more frequently if you are overweight and have at least 1 risk factor for diabetes.  Colorectal cancer can be detected and often prevented. Most routine colorectal cancer screening begins at the age of 50 and continues through age 75. However, your health care provider may recommend screening at an earlier age if you have risk factors for colon cancer. On a yearly basis, your health care provider may provide home test kits to check for hidden blood in the stool. A small camera at the end of a tube may be used to directly examine the colon (sigmoidoscopy or colonoscopy) to detect the earliest forms of colorectal cancer. Talk to your health care provider about this at age 50 when routine screening begins. A direct exam of the colon should be repeated every 5-10 years through age 75, unless early forms of precancerous polyps or small growths are found.  People who are at an increased risk for hepatitis B should be screened for this virus. You are considered at high risk for hepatitis B if:  You were born in a country where hepatitis B occurs often.  Talk with your health care provider about which countries are considered high risk.  Your parents were born in a high-risk country and you have not received a shot to protect against hepatitis B (hepatitis B vaccine).  You have HIV or AIDS.  You use needles to inject street drugs.  You live with, or have sex with, someone who has hepatitis B.  You are a man who has sex with other men (MSM).  You get hemodialysis treatment.  You take certain medicines for conditions like cancer, organ transplantation, and autoimmune conditions.  Hepatitis C blood testing is recommended for all people born from 1945 through 1965 and any individual with known risk factors for hepatitis C.  Healthy men should no longer receive prostate-specific antigen (PSA) blood tests as part of routine cancer screening. Talk to your health care provider about prostate cancer screening.  Testicular cancer screening is not recommended for adolescents or   adult males who have no symptoms. Screening includes self-exam, a health care provider exam, and other screening tests. Consult with your health care provider about any symptoms you have or any concerns you have about testicular cancer.  Practice safe sex. Use condoms and avoid high-risk sexual practices to reduce the spread of sexually transmitted infections (STIs).  You should be screened for STIs, including gonorrhea and chlamydia if:  You are sexually active and are younger than 24 years.  You are older than 24 years, and your health care provider tells you that you are at risk for this type of infection.  Your sexual activity has changed since you were last screened, and you are at an increased risk for chlamydia or gonorrhea. Ask your health care provider if you are at risk.  If you are at risk of being infected with HIV, it is recommended that you take a prescription medicine daily to prevent HIV infection. This is called pre-exposure prophylaxis (PrEP). You are  considered at risk if:  You are a man who has sex with other men (MSM).  You are a heterosexual man who is sexually active with multiple partners.  You take drugs by injection.  You are sexually active with a partner who has HIV.  Talk with your health care provider about whether you are at high risk of being infected with HIV. If you choose to begin PrEP, you should first be tested for HIV. You should then be tested every 3 months for as long as you are taking PrEP.  Use sunscreen. Apply sunscreen liberally and repeatedly throughout the day. You should seek shade when your shadow is shorter than you. Protect yourself by wearing long sleeves, pants, a wide-brimmed hat, and sunglasses year round whenever you are outdoors.  Tell your health care provider of new moles or changes in moles, especially if there is a change in shape or color. Also, tell your health care provider if a mole is larger than the size of a pencil eraser.  A one-time screening for abdominal aortic aneurysm (AAA) and surgical repair of large AAAs by ultrasound is recommended for men aged 65-75 years who are current or former smokers.  Stay current with your vaccines (immunizations).   This information is not intended to replace advice given to you by your health care provider. Make sure you discuss any questions you have with your health care provider.   Document Released: 11/06/2007 Document Revised: 05/31/2014 Document Reviewed: 10/05/2010 Elsevier Interactive Patient Education 2016 Elsevier Inc.  

## 2015-10-03 NOTE — Assessment & Plan Note (Signed)
Check a1c 

## 2015-10-03 NOTE — Addendum Note (Signed)
Addended by: Terence Lux B on: 10/03/2015 04:21 PM   Modules accepted: Orders

## 2015-10-03 NOTE — Progress Notes (Signed)
Pre visit review using our clinic review tool, if applicable. No additional management support is needed unless otherwise documented below in the visit note. 

## 2015-10-03 NOTE — Progress Notes (Signed)
Subjective:    Patient ID: Danny Johnson, male    DOB: 06-21-1950, 65 y.o.   MRN: [id number]  HPI He is here to establish with a new pcp.   He is here for a physical exam.   He quit smoking 2 months ago. He uses albuterol as needed He walks 3 miles every other day.  He work at [employer] course and plays golf a lot.  He denies any changes in his history or his family history, except quitting smoking. He denies any questions or concerns.  Medications and allergies reviewed with patient and updated if appropriate.  Patient Active Problem List   Diagnosis Date Noted  . Hyperglycemia 07/06/2013  . Coronary atherosclerosis of native coronary artery 07/09/2011  . ERECTILE DYSFUNCTION, ORGANIC 11/13/2009  . TOBACCO ABUSE 06/19/2009  . ALLERGIC RHINITIS 06/19/2009  . AORTIC STENOSIS 11/05/2008  . Hyperlipidemia 03/18/2008  . Anxiety state 03/18/2008  . Essential hypertension 03/18/2008  . PVC's (premature ventricular contractions) 03/18/2008    Current Outpatient Prescriptions on File Prior to Visit  Medication Sig Dispense Refill  . albuterol (PROAIR HFA) 108 (90 BASE) MCG/ACT inhaler Inhale 2 puffs into the lungs every 6 (six) hours as needed for wheezing or shortness of breath. 3 Inhaler 2  . aspirin 81 MG tablet Take 81 mg by mouth daily.      Marland Kitchen atorvastatin (LIPITOR) 10 MG tablet Take 1 tablet (10 mg total) by mouth daily. 90 tablet 3  . quinapril (ACCUPRIL) 20 MG tablet Take 1 tablet (20 mg total) by mouth daily. 90 tablet 3  . sildenafil (VIAGRA) 100 MG tablet Take 0.5-1 tablets (50-100 mg total) by mouth daily as needed for erectile dysfunction. 5 tablet 3  . venlafaxine XR (EFFEXOR-XR) 75 MG 24 hr capsule Take 1 capsule (75 mg total) by mouth daily with breakfast. 90 capsule 3   No current facility-administered medications on file prior to visit.    Past Medical History  Diagnosis Date  . PVC's (premature ventricular contractions)   . Melanoma ([place]) 1993  . Anxiety   .  Asthma   . Mixed hyperlipidemia   . Essential hypertension, benign   . Arthritis   . Coronary atherosclerosis of native coronary artery     Minimal, LVEF 60%  . Aortic stenosis     Mild to moderate  . Bicuspid aortic valve     Past Surgical History  Procedure Laterality Date  . Tonsillectomy      Social History   Social History  . Marital Status: Divorced    Spouse Name: N/A  . Number of Children: N/A  . Years of Education: N/A   Social History Main Topics  . Smoking status: Former Smoker -- 1.00 packs/day for 10 years    Types: Cigarettes    Start date: 07/21/2001  . Smokeless tobacco: Never Used  . Alcohol Use: 25.2 oz/week    42 Standard drinks or equivalent per week     Comment: 4-6 drinks daily, beer/wine  . Drug Use: No  . Sexual Activity: Not Asked   Other Topics Concern  . None   Social History Narrative    Family History  Problem Relation Age of Onset  . Dementia Mother     Review of Systems  Constitutional: Negative for fever, chills, appetite change, fatigue and unexpected weight change.  HENT: Negative for hearing loss.   Eyes: Negative for visual disturbance.  Respiratory: Negative for cough, shortness of breath and wheezing.  Cardiovascular: Positive for palpitations (occasional). Negative for chest pain and leg swelling.  Gastrointestinal: Negative for nausea, abdominal pain, diarrhea, constipation and blood in stool.       No gerd  Genitourinary: Negative for dysuria, hematuria and difficulty urinating.  Musculoskeletal: Positive for arthralgias (right hip). Negative for myalgias.  Skin: Negative for color change and rash.  Neurological: Negative for dizziness, light-headedness, numbness and headaches.  Psychiatric/Behavioral: Negative for dysphoric mood. The patient is nervous/anxious (controlled).        Objective:   Filed Vitals:   10/03/15 1451  BP: 122/80  Pulse: 95  Temp: 98.5 F (36.9 C)   Filed Weights   10/03/15 1451    Weight: 255 lb (115.667 kg)   Body mass index is 33.65 kg/(m^2).   Physical Exam Constitutional: He appears well-developed and well-nourished. No distress.  HENT:  Head: Normocephalic and atraumatic.  Right Ear: External ear normal.  Left Ear: External ear normal.  Mouth/Throat: Oropharynx is clear and moist.  Normal ear canals and TM b/l  Eyes: Conjunctivae and EOM are normal.  Neck: Neck supple. No tracheal deviation present. No thyromegaly present.  No carotid bruit  Cardiovascular: Normal rate, regular rhythm, normal heart sounds and intact distal pulses.   No murmur heard. Pulmonary/Chest: Effort normal and breath sounds normal. No respiratory distress. He has no wheezes. He has no rales.  Abdominal: Soft. Bowel sounds are normal. He exhibits no distension. There is no tenderness.  Genitourinary: deferred - will check psa Musculoskeletal: He exhibits no edema.  Lymphadenopathy:    He has no cervical adenopathy.  Skin: Skin is warm and dry. He is not diaphoretic.  Psychiatric: He has a normal mood and affect. His behavior is normal.       Assessment & Plan:   Physical exam: Screening blood work ordered Immunizations pneumovax today, other immunizations up to date Colonoscopy Up to date  Eye exams Up to date  EKG - done by cardio Exercise - walking Weight - plans on working on weight loss Skin  - sees a derm Substance abuse - will try cutting back on his alcohol intake - drinks more than he should but drinks are weak   See Problem List for Assessment and Plan of chronic medical problems.  Follow up annually

## 2015-10-03 NOTE — Assessment & Plan Note (Signed)
Check lipids On lipitor 10 mg

## 2015-10-03 NOTE — Assessment & Plan Note (Signed)
BP well controlled Current regimen effective and well tolerated Continue current medications at current doses  

## 2015-10-06 ENCOUNTER — Other Ambulatory Visit (INDEPENDENT_AMBULATORY_CARE_PROVIDER_SITE_OTHER): Payer: Medicare Other

## 2015-10-06 DIAGNOSIS — Z0000 Encounter for general adult medical examination without abnormal findings: Secondary | ICD-10-CM

## 2015-10-06 DIAGNOSIS — Z125 Encounter for screening for malignant neoplasm of prostate: Secondary | ICD-10-CM

## 2015-10-06 DIAGNOSIS — Z1159 Encounter for screening for other viral diseases: Secondary | ICD-10-CM

## 2015-10-06 LAB — CBC WITH DIFFERENTIAL/PLATELET
BASOS ABS: 0.1 10*3/uL (ref 0.0–0.1)
Basophils Relative: 1.1 % (ref 0.0–3.0)
EOS ABS: 0.6 10*3/uL (ref 0.0–0.7)
Eosinophils Relative: 7.8 % — ABNORMAL HIGH (ref 0.0–5.0)
HEMATOCRIT: 45.3 % (ref 39.0–52.0)
HEMOGLOBIN: 15.7 g/dL (ref 13.0–17.0)
Lymphocytes Relative: 30 % (ref 12.0–46.0)
Lymphs Abs: 2.2 10*3/uL (ref 0.7–4.0)
MCHC: 34.6 g/dL (ref 30.0–36.0)
MCV: 96.4 fl (ref 78.0–100.0)
MONOS PCT: 8.8 % (ref 3.0–12.0)
Monocytes Absolute: 0.6 10*3/uL (ref 0.1–1.0)
NEUTROS ABS: 3.8 10*3/uL (ref 1.4–7.7)
Neutrophils Relative %: 52.3 % (ref 43.0–77.0)
PLATELETS: 241 10*3/uL (ref 150.0–400.0)
RBC: 4.7 Mil/uL (ref 4.22–5.81)
RDW: 13.2 % (ref 11.5–15.5)
WBC: 7.4 10*3/uL (ref 4.0–10.5)

## 2015-10-06 LAB — COMPREHENSIVE METABOLIC PANEL
ALK PHOS: 83 U/L (ref 39–117)
ALT: 23 U/L (ref 0–53)
AST: 19 U/L (ref 0–37)
Albumin: 4.5 g/dL (ref 3.5–5.2)
BILIRUBIN TOTAL: 0.5 mg/dL (ref 0.2–1.2)
BUN: 15 mg/dL (ref 6–23)
CO2: 25 mEq/L (ref 19–32)
CREATININE: 0.83 mg/dL (ref 0.40–1.50)
Calcium: 9.2 mg/dL (ref 8.4–10.5)
Chloride: 104 mEq/L (ref 96–112)
GFR: 98.3 mL/min (ref >60.00)
Glucose, Bld: 96 mg/dL (ref 70–99)
Potassium: 4.4 mEq/L (ref 3.5–5.1)
Sodium: 141 mEq/L (ref 135–145)
TOTAL PROTEIN: 6.4 g/dL (ref 6.0–8.3)

## 2015-10-06 LAB — PSA, MEDICARE: PSA: 0.29 ng/mL (ref 0.10–4.00)

## 2015-10-06 LAB — HEMOGLOBIN A1C: HEMOGLOBIN A1C: 5.9 % (ref 4.6–6.5)

## 2015-10-06 LAB — LIPID PANEL
Cholesterol: 186 mg/dL (ref 0–200)
HDL: 51.8 mg/dL (ref >39.00)
LDL Cholesterol: 121 mg/dL — ABNORMAL HIGH (ref 0–99)
NonHDL: 133.91
TRIGLYCERIDES: 67 mg/dL (ref 0.0–149.0)
Total CHOL/HDL Ratio: 4
VLDL: 13.4 mg/dL (ref 0.0–40.0)

## 2015-10-06 LAB — TSH: TSH: 1.99 u[IU]/mL (ref 0.35–4.50)

## 2015-10-07 ENCOUNTER — Encounter: Payer: Self-pay | Admitting: Internal Medicine

## 2015-10-07 DIAGNOSIS — R7303 Prediabetes: Secondary | ICD-10-CM | POA: Insufficient documentation

## 2015-10-07 LAB — HEPATITIS C ANTIBODY: HCV Ab: NEGATIVE

## 2015-11-18 ENCOUNTER — Other Ambulatory Visit: Payer: Self-pay | Admitting: Internal Medicine

## 2015-12-17 ENCOUNTER — Other Ambulatory Visit: Payer: Self-pay | Admitting: Internal Medicine

## 2016-01-20 ENCOUNTER — Encounter: Payer: Self-pay | Admitting: Internal Medicine

## 2016-01-20 ENCOUNTER — Ambulatory Visit (INDEPENDENT_AMBULATORY_CARE_PROVIDER_SITE_OTHER): Payer: Medicare Other | Admitting: Internal Medicine

## 2016-01-20 ENCOUNTER — Other Ambulatory Visit: Payer: Medicare Other

## 2016-01-20 VITALS — BP 126/84 | HR 96 | Temp 98.3°F | Resp 16 | Wt 255.0 lb

## 2016-01-20 DIAGNOSIS — Z20828 Contact with and (suspected) exposure to other viral communicable diseases: Secondary | ICD-10-CM | POA: Diagnosis not present

## 2016-01-20 NOTE — Progress Notes (Signed)
Subjective:    Patient ID: Danny Johnson, male    DOB: 01-05-1951, 65 y.o.   MRN: [id number]  HPI He is here for an acute visit.   He is dating a women and they have not been sexually active.  She advised him she does have genital herpes and has only had a few flares.  She would like to be sex with him at some time.  He is concerned and here to get more information.  He denies any known genital lesions or stds in the past.    Medications and allergies reviewed with patient and updated if appropriate.  Patient Active Problem List   Diagnosis Date Noted  . Prediabetes 10/07/2015  . Hyperglycemia 07/06/2013  . Coronary atherosclerosis of native coronary artery 07/09/2011  . ERECTILE DYSFUNCTION, ORGANIC 11/13/2009  . ALLERGIC RHINITIS 06/19/2009  . AORTIC STENOSIS 11/05/2008  . Hyperlipidemia 03/18/2008  . Anxiety state 03/18/2008  . Essential hypertension 03/18/2008  . PVC's (premature ventricular contractions) 03/18/2008    Current Outpatient Prescriptions on File Prior to Visit  Medication Sig Dispense Refill  . albuterol (PROAIR HFA) 108 (90 BASE) MCG/ACT inhaler Inhale 2 puffs into the lungs every 6 (six) hours as needed for wheezing or shortness of breath. 3 Inhaler 2  . aspirin 81 MG tablet Take 81 mg by mouth daily.      Marland Kitchen atorvastatin (LIPITOR) 10 MG tablet Take 1 tablet (10 mg total) by mouth daily. 90 tablet 1  . quinapril (ACCUPRIL) 20 MG tablet TAKE 1 TABLET BY MOUTH DAILY 90 tablet 2  . sildenafil (VIAGRA) 100 MG tablet Take 0.5-1 tablets (50-100 mg total) by mouth daily as needed for erectile dysfunction. 5 tablet 3  . venlafaxine XR (EFFEXOR-XR) 75 MG 24 hr capsule TAKE 1 CAPSULE BY MOUTH DAILY WITH BREAKFAST 90 capsule 2   No current facility-administered medications on file prior to visit.     Past Medical History:  Diagnosis Date  . Anxiety   . Aortic stenosis    Mild to moderate  . Arthritis   . Asthma   . Bicuspid aortic valve   . Coronary  atherosclerosis of native coronary artery    Minimal, LVEF 60%  . Essential hypertension, benign   . Melanoma ([place]) 1993  . Mixed hyperlipidemia   . PVC's (premature ventricular contractions)     Past Surgical History:  Procedure Laterality Date  . TONSILLECTOMY      Social History   Social History  . Marital status: Divorced    Spouse name: N/A  . Number of children: N/A  . Years of education: N/A   Social History Main Topics  . Smoking status: Former Smoker    Packs/day: 1.00    Years: 10.00    Types: Cigarettes    Start date: 07/21/2001  . Smokeless tobacco: Never Used  . Alcohol use 25.2 oz/week    42 Standard drinks or equivalent per week     Comment: 4-6 drinks daily, beer/wine  . Drug use: No  . Sexual activity: Not on file   Other Topics Concern  . Not on file   Social History Narrative  . No narrative on file    Family History  Problem Relation Age of Onset  . Dementia Mother     Review of Systems No GU symptoms or lesions    Objective:   Vitals:   01/20/16 1054  BP: 126/84  Pulse: 96  Resp: 16  Temp: 98.3 F (36.8 C)  Filed Weights   01/20/16 1054  Weight: 255 lb (115.7 kg)   Body mass index is 33.64 kg/m.   Physical Exam  GU exam deferred, he is asymptomatic      Assessment & Plan:     Discussed herpes - and treatment options Discussed risk of him getting herpes, use of condoms and valtrex Will check hsv 1 and 2 for him - if he is positive that may influence his decision  He has no further question, but will let me know if he does  15 minutes were spent face-to-face with the patient, over 50% of which was spent counseling regarding herpes and his risk of obtaining herpes

## 2016-01-20 NOTE — Progress Notes (Signed)
Pre visit review using our clinic review tool, if applicable. No additional management support is needed unless otherwise documented below in the visit note. 

## 2016-01-20 NOTE — Patient Instructions (Signed)
  Tests ordered today. Your results will be released to MyChart (or called to you) after review, usually within 72hours after test completion. If any changes need to be made, you will be notified at that same time.    

## 2016-01-21 ENCOUNTER — Encounter: Payer: Self-pay | Admitting: Internal Medicine

## 2016-01-21 LAB — HSV 2 ANTIBODY, IGG

## 2016-01-21 LAB — HSV 1 ANTIBODY, IGG: HSV 1 GLYCOPROTEIN G AB, IGG: 54.6 {index} — AB (ref <0.90)

## 2016-02-04 ENCOUNTER — Encounter: Payer: Self-pay | Admitting: Internal Medicine

## 2016-02-04 ENCOUNTER — Other Ambulatory Visit: Payer: Self-pay | Admitting: Emergency Medicine

## 2016-02-04 MED ORDER — ALBUTEROL SULFATE HFA 108 (90 BASE) MCG/ACT IN AERS: 2.0000 | INHALATION_SPRAY | Freq: Four times a day (QID) | RESPIRATORY_TRACT | 2 refills | Status: DC | PRN

## 2016-02-17 ENCOUNTER — Encounter: Payer: Self-pay | Admitting: Internal Medicine

## 2016-02-18 ENCOUNTER — Other Ambulatory Visit: Payer: Self-pay | Admitting: Emergency Medicine

## 2016-02-18 MED ORDER — ALBUTEROL SULFATE HFA 108 (90 BASE) MCG/ACT IN AERS: 2.0000 | INHALATION_SPRAY | Freq: Four times a day (QID) | RESPIRATORY_TRACT | 2 refills | Status: DC | PRN

## 2016-02-18 NOTE — Telephone Encounter (Signed)
Spoke with [employer], Pts insurance will not cover Proair, but will cover Ventolin. RX has been changed, pt contacted via [place].

## 2016-05-27 ENCOUNTER — Other Ambulatory Visit: Payer: Self-pay | Admitting: Emergency Medicine

## 2016-05-27 MED ORDER — ATORVASTATIN CALCIUM 10 MG PO TABS: 10.0000 mg | ORAL_TABLET | Freq: Every day | ORAL | 1 refills | Status: DC

## 2016-05-30 ENCOUNTER — Encounter: Payer: Self-pay | Admitting: Internal Medicine

## 2016-05-31 NOTE — Telephone Encounter (Signed)
Please advise 

## 2016-07-20 DIAGNOSIS — D122 Benign neoplasm of ascending colon: Secondary | ICD-10-CM | POA: Diagnosis not present

## 2016-07-20 DIAGNOSIS — Z86010 Personal history of colonic polyps: Secondary | ICD-10-CM | POA: Diagnosis not present

## 2016-07-21 LAB — HM COLONOSCOPY

## 2016-07-29 ENCOUNTER — Encounter: Payer: Self-pay | Admitting: Internal Medicine

## 2016-08-08 ENCOUNTER — Encounter: Payer: Self-pay | Admitting: Internal Medicine

## 2016-08-08 DIAGNOSIS — Z113 Encounter for screening for infections with a predominantly sexual mode of transmission: Secondary | ICD-10-CM

## 2016-08-10 ENCOUNTER — Other Ambulatory Visit: Payer: Medicare Other

## 2016-08-10 DIAGNOSIS — Z113 Encounter for screening for infections with a predominantly sexual mode of transmission: Secondary | ICD-10-CM

## 2016-08-11 ENCOUNTER — Encounter: Payer: Self-pay | Admitting: Internal Medicine

## 2016-08-11 LAB — HSV 2 ANTIBODY, IGG: HSV 2 Glycoprotein G Ab, IgG: <0.9 Index (ref <0.90)

## 2016-08-11 LAB — HSV 1 ANTIBODY, IGG: HSV 1 Glycoprotein G Ab, IgG: >58 Index — ABNORMAL HIGH (ref <0.90)

## 2016-08-30 ENCOUNTER — Ambulatory Visit (HOSPITAL_COMMUNITY)
Admission: RE | Admit: 2016-08-30 | Discharge: 2016-08-30 | Disposition: A | Discharge status: Home / Self Care | Payer: Medicare Other | Source: Ambulatory Visit | Attending: Cardiology | Admitting: Cardiology

## 2016-08-30 DIAGNOSIS — I2510 Atherosclerotic heart disease of native coronary artery without angina pectoris: Secondary | ICD-10-CM | POA: Insufficient documentation

## 2016-08-30 DIAGNOSIS — I119 Hypertensive heart disease without heart failure: Secondary | ICD-10-CM | POA: Diagnosis not present

## 2016-08-30 DIAGNOSIS — I350 Nonrheumatic aortic (valve) stenosis: Secondary | ICD-10-CM | POA: Diagnosis not present

## 2016-08-30 DIAGNOSIS — I359 Nonrheumatic aortic valve disorder, unspecified: Secondary | ICD-10-CM | POA: Diagnosis present

## 2016-08-30 DIAGNOSIS — I348 Other nonrheumatic mitral valve disorders: Secondary | ICD-10-CM | POA: Diagnosis not present

## 2016-08-30 LAB — ECHOCARDIOGRAM COMPLETE
AV Mean grad: 26 mmHg
AV Peak grad: 49 mmHg
AV pk vel: 351 cm/s
AVA: 0.97 cm2
AVAREAMEANV: 0.96 cm2
AVAREAMEANVIN: 0.39 cm2/m2
AVAREAVTI: 0.89 cm2
AVAREAVTIIND: 0.39 cm2/m2
AVCELMEANRAT: 0.25
AVLVOTPG: 3 mmHg
Ao pk vel: 0.24 m/s
CHL CUP AV PEAK INDEX: 0.36
CHL CUP AV VALUE AREA INDEX: 0.39
CHL CUP AV VEL: 0.97
CHL CUP STROKE VOLUME: 56 mL
DOP CAL AO MEAN VELOCITY: 233 cm/s
EERAT: 6.36
EWDT: 246 ms
FS: 39 % (ref 28–44)
IVS/LV PW RATIO, ED: 1.12
LA ID, A-P, ES: 44 mm
LA diam end sys: 44 mm
LA diam index: 1.78 cm/m2
LA vol A4C: 38.7 ml
LA vol index: 17.7 mL/m2
LA vol: 43.7 mL
LDCA: 3.8 cm2
LV E/e' medial: 6.36
LV E/e'average: 6.36
LV PW d: 11.2 mm — AB (ref 0.6–1.1)
LV SIMPSON'S DISK: 72
LV dias vol index: 31 mL/m2
LV e' LATERAL: 9.25 cm/s
LV sys vol: 22 mL
LVDIAVOL: 78 mL (ref 62–150)
LVOT SV: 71 mL
LVOT VTI: 18.8 cm
LVOTD: 22 mm
LVOTPV: 82.5 cm/s
LVOTVTI: 0.26 cm
LVSYSVOLIN: 9 mL/m2
Lateral S' vel: 13.7 cm/s
MV Dec: 246
MVPKAVEL: 76.2 m/s
MVPKEVEL: 58.8 m/s
P 1/2 time: 500 ms
RV TAPSE: 26.2 mm
TDI e' lateral: 9.25
TDI e' medial: 6.85
VTI: 73.4 cm

## 2016-08-30 NOTE — Progress Notes (Signed)
*  PRELIMINARY RESULTS* Echocardiogram 2D Echocardiogram has been performed.  Danny Johnson 08/30/2016, 2:01 PM

## 2016-09-07 NOTE — Progress Notes (Signed)
Cardiology Office Note  Date: 09/08/2016   ID: FLORENCE ANTONELLI, DOB [birth year], MRN [id number]  PCP: Binnie Rail, MD  Primary Cardiologist: Rozann Lesches, MD   Chief Complaint  Patient presents with  . Aortic Stenosis    History of Present Illness: RICAHRD SCHWAGER is a 66 y.o. male last seen in April 2017. He presents for a routine follow-up visit. In terms of stamina or progressive symptoms, he continues to do well. He dances 3 days a week and also walks 3 miles at a time at a brisk pace for exercise. He reports NYHA class II dyspnea in general, no exertional chest pain, palpitations, or syncope.  Recent follow-up echocardiogram showed LVEF 60-65% with evidence of moderately severe aortic stenosis, mean gradient 26 mmHg. This has progressed compared to the previous study. I discussed the results with him today.  I reviewed his ECG which shows sinus rhythm with are prominently V1.  Current medications include aspirin, quinapril, and Lipitor.  Past Medical History:  Diagnosis Date  . Anxiety   . Aortic stenosis    Mild to moderate  . Arthritis   . Asthma   . Bicuspid aortic valve   . Coronary atherosclerosis of native coronary artery    Minimal, LVEF 60%  . Essential hypertension, benign   . Melanoma ([place]) 1993  . Mixed hyperlipidemia   . PVC's (premature ventricular contractions)     Past Surgical History:  Procedure Laterality Date  . TONSILLECTOMY      Current Outpatient Prescriptions  Medication Sig Dispense Refill  . albuterol (PROAIR HFA) 108 (90 Base) MCG/ACT inhaler Inhale 2 puffs into the lungs every 6 (six) hours as needed for wheezing or shortness of breath. 3 Inhaler 2  . aspirin 81 MG tablet Take 81 mg by mouth daily.      Marland Kitchen atorvastatin (LIPITOR) 10 MG tablet Take 1 tablet (10 mg total) by mouth daily. 90 tablet 1  . quinapril (ACCUPRIL) 20 MG tablet TAKE 1 TABLET BY MOUTH DAILY 90 tablet 2  . sildenafil (VIAGRA) 100 MG tablet Take 0.5-1 tablets  (50-100 mg total) by mouth daily as needed for erectile dysfunction. 5 tablet 3  . venlafaxine XR (EFFEXOR-XR) 75 MG 24 hr capsule TAKE 1 CAPSULE BY MOUTH DAILY WITH BREAKFAST 90 capsule 2   No current facility-administered medications for this visit.    Allergies:  Patient has no known allergies.   Social History: The patient  reports that he has quit smoking. His smoking use included Cigarettes. He started smoking about 15 years ago. He has a 10.00 pack-year smoking history. He has never used smokeless tobacco. He reports that he drinks about 25.2 oz of alcohol per week . He reports that he does not use drugs.   ROS:  Please see the history of present illness. Otherwise, complete review of systems is positive for none.  All other systems are reviewed and negative.   Physical Exam: VS:  BP 122/78   Pulse 76   Ht 6\' 1"  (1.854 m)   Wt 241 lb (109.3 kg)   SpO2 96%   BMI 31.80 kg/m , BMI Body mass index is 31.8 kg/m.  Wt Readings from Last 3 Encounters:  09/08/16 241 lb (109.3 kg)  01/20/16 255 lb (115.7 kg)  10/03/15 255 lb (115.7 kg)    Patient comfortable in no acute distress.  HEENT: Conjunctiva and lids normal, oropharynx clear.  Neck: Supple, no carotid bruits or thyromegaly.  Lungs: Clear to auscultation.  Cardiac: Regular rate and rhythm, 2/6 systolic murmur at the base, second heart sound preserved, no S3 gallop.  Abdomen: Soft, nontender.  Extremity: No pitting edema.  Skin: Warm and dry. [place] skeletal: No kyphosis. Neuropsychiatric: Alert and oriented x3, affect appropriate.  ECG: I personally reviewed the tracing from 07/22/2014 which showed sinus rhythm with PVCs, possible left atrial enlargement, R' in V1.  Recent Labwork: 10/06/2015: ALT 23; AST 19; BUN 15; Creatinine, Ser 0.83; Hemoglobin 15.7; Platelets 241.0; Potassium 4.4; Sodium 141; TSH 1.99     Component Value Date/Time   CHOL 186 10/06/2015 0729   TRIG 67.0 10/06/2015 0729   TRIG 89 10/25/2008    HDL 51.80 10/06/2015 0729   CHOLHDL 4 10/06/2015 0729   VLDL 13.4 10/06/2015 0729   LDLCALC 121 (H) 10/06/2015 0729   LDLDIRECT 153.1 06/29/2012 0929    Other Studies Reviewed Today:  Echocardiogram 08/30/2016: Study Conclusions  - Left ventricle: The cavity size was normal. Wall thickness was   increased in a pattern of mild LVH. Systolic function was normal.   The estimated ejection fraction was in the range of 60% to 65%.   Doppler parameters are consistent with abnormal left ventricular   relaxation (grade 1 diastolic dysfunction). - Regional wall motion abnormality: Hypokinesis of the mid   inferoseptal and mid inferior myocardium. - Aortic valve: Severely calcified leaflets. There was moderate to   severe stenosis. Peak velocity (S): 351 cm/s. Mean gradient (S):   26 mm Hg. Valve area (VTI): 0.97 cm^2. Valve area (Vmax): 0.89   cm^2. Valve area (Vmean): 0.96 cm^2. - Mitral valve: Calcified annulus.  Assessment and Plan:  1. Bicuspid aortic valve with moderately severe aortic stenosis with progression compared to the previous study. He remains symptomatically stable however and we will continue with observation. Follow-up echocardiogram in 6 months. We discussed the natural history of aortic stenosis and ultimately need for repair.  2. Tobacco abuse in remission.  3. Essential hypertension, continues on quinapril. Systolic blood pressure 021J.  4. Hyperlipidemia, continues on Lipitor. Follows with PCP.  Current medicines were reviewed with the patient today.   Orders Placed This Encounter  Procedures  . EKG 12-Lead  . ECHOCARDIOGRAM COMPLETE    Disposition: Follow-up in 6 months.  Signed, Satira Sark, MD, [facility] 09/08/2016 1:16 PM    [facility] [address], [place], [place] [address] Phone: [phone]; Fax: [phone]

## 2016-09-08 ENCOUNTER — Encounter: Payer: Self-pay | Admitting: Cardiology

## 2016-09-08 ENCOUNTER — Ambulatory Visit (INDEPENDENT_AMBULATORY_CARE_PROVIDER_SITE_OTHER): Payer: Medicare Other | Admitting: Cardiology

## 2016-09-08 VITALS — BP 122/78 | HR 76 | Ht 73.0 in | Wt 241.0 lb

## 2016-09-08 DIAGNOSIS — Q231 Congenital insufficiency of aortic valve: Secondary | ICD-10-CM

## 2016-09-08 DIAGNOSIS — F17201 Nicotine dependence, unspecified, in remission: Secondary | ICD-10-CM | POA: Diagnosis not present

## 2016-09-08 DIAGNOSIS — I10 Essential (primary) hypertension: Secondary | ICD-10-CM

## 2016-09-08 DIAGNOSIS — E782 Mixed hyperlipidemia: Secondary | ICD-10-CM | POA: Diagnosis not present

## 2016-09-08 DIAGNOSIS — I350 Nonrheumatic aortic (valve) stenosis: Secondary | ICD-10-CM | POA: Diagnosis not present

## 2016-09-08 DIAGNOSIS — Q2381 Bicuspid aortic valve: Secondary | ICD-10-CM

## 2016-09-08 NOTE — Patient Instructions (Signed)
Your physician wants you to follow-up in: 6 months You will receive a reminder letter in the mail two months in advance. If you don't receive a letter, please call our office to schedule the follow-up appointment.    Your physician has requested that you have an echocardiogram in 6 months JUST BEFORE NEXT VISIT. Echocardiography is a painless test that uses sound waves to create images of your heart. It provides your doctor with information about the size and shape of your heart and how well your heart's chambers and valves are working. This procedure takes approximately one hour. There are no restrictions for this procedure.  Your physician recommends that you continue on your current medications as directed. Please refer to the Current Medication list given to you today.    Thank you for choosing [facility] !

## 2016-09-15 ENCOUNTER — Other Ambulatory Visit: Payer: Self-pay | Admitting: Emergency Medicine

## 2016-09-15 MED ORDER — QUINAPRIL HCL 20 MG PO TABS: 20.0000 mg | ORAL_TABLET | Freq: Every day | ORAL | 0 refills | Status: DC

## 2016-09-19 NOTE — Progress Notes (Signed)
Subjective:    Patient ID: Danny Johnson, male    DOB: Jul 17, 1950, 66 y.o.   MRN: [id number]  HPI Here for subsequent medicare wellness exam and follow up of his chronic medical problems.   I have personally reviewed and have noted 1.The patient's medical and social history 2.Their use of alcohol, tobacco or illicit drugs 3.Their current medications and supplements 4.The patient's functional ability including ADL's, fall risks, home safety risks and hearing or visual impairment. 5.Diet and physical activities 6.Evidence for depression or mood disorders 7.Care team reviewed - cardiology - Dr Domenic Polite, derm - Hali Marry   Hyperlipidemia: He is taking his medication daily. He is compliant with a low fat/cholesterol diet. He has lost weight.  He is exercising regularly. He denies myalgias.   Hypertension: He is taking his medication daily. He is compliant with a low sodium diet.  He denies chest pain, palpitations, edema, shortness of breath and regular headaches. He is exercising.     Prediabetes:  He is compliant with a low sugar/carbohydrate diet - he has decreased his sugars/carbs to help him lose weight.  He is exercising regularly.  Anxiety: He is taking his medication daily as prescribed. He denies any side effects from the medication. He feels his anxiety is well controlled but wants to come off the medication.  Left ear  - Recently his hearing is worse.  He wonders about wax build up.  If he blows his nose the ear will pop and the hearing improves.  He has had left thigh pain.  It started 6 weeks ago.  It is intermittent.  He denies knee and hip pain on the left.  He thinks it may be from dancing.  It is not severe and he does not think he needs further evaluation.   He has quit smoking almost, and has decreased his alcohol.  He has lost weight - low carb diet.  .   Are there smokers in your home (other than  you)? No  Risk Factors Exercise: golf, shag dancing, walking Dietary issues discussed: low carb, well balanced diet - lot of fruits and veges  Cardiac risk factors: advanced age, hypertension, hyperlipidemia, and obesity (BMI >= 30 kg/m2).  Depression Screen  Have you felt down, depressed or hopeless? No  Have you felt little interest or pleasure in doing things?  No  Activities of Daily Living In your present state of health, do you have any difficulty performing the following activities?:  Driving? No Managing money?  No Feeding yourself? No Getting from bed to chair? No Climbing a flight of stairs? No Preparing food and eating?: No Bathing or showering? No Getting dressed: No Getting to/using the toilet? No Moving around from place to place: No In the past year have you fallen or had a near fall?: yes, fell on ice - no injuries   Are you sexually active?  yes  Do you have more than one partner?  no  Hearing Difficulties: yes - left ear - reversible Do you often ask people to speak up or repeat themselves? No Do you experience ringing or noises in your ears? No Do you have difficulty understanding soft or whispered voices? yes Vision:              Any change in vision:  no             Up to date with eye exam:  Yes    Memory:  Do you feel that you  have a problem with memory? No -normal recall difficulty  Do you often misplace items? No  Do you feel safe at home?  Yes  Cognitive Testing  Alert, Orientated? Yes  Normal Appearance? Yes  Recall of three objects?  Yes  Can perform simple calculations? Yes  Displays appropriate judgment? Yes  Can read the correct time from a watch face? Yes   Advanced Directives have been discussed with the patient? Yes - discuss with kids  - discussed putting it in writing   Medications and allergies reviewed with patient and updated if appropriate.  Patient Active Problem List   Diagnosis Date Noted  . Prediabetes 10/07/2015  .  Coronary atherosclerosis of native coronary artery 07/09/2011  . ERECTILE DYSFUNCTION, ORGANIC 11/13/2009  . ALLERGIC RHINITIS 06/19/2009  . AORTIC STENOSIS 11/05/2008  . Hyperlipidemia 03/18/2008  . Anxiety state 03/18/2008  . Essential hypertension 03/18/2008  . PVC's (premature ventricular contractions) 03/18/2008    Current Outpatient Prescriptions on File Prior to Visit  Medication Sig Dispense Refill  . albuterol (PROAIR HFA) 108 (90 Base) MCG/ACT inhaler Inhale 2 puffs into the lungs every 6 (six) hours as needed for wheezing or shortness of breath. 3 Inhaler 2  . aspirin 81 MG tablet Take 81 mg by mouth daily.      Marland Kitchen atorvastatin (LIPITOR) 10 MG tablet Take 1 tablet (10 mg total) by mouth daily. 90 tablet 1  . quinapril (ACCUPRIL) 20 MG tablet Take 1 tablet (20 mg total) by mouth daily. 90 tablet 0  . sildenafil (VIAGRA) 100 MG tablet Take 0.5-1 tablets (50-100 mg total) by mouth daily as needed for erectile dysfunction. 5 tablet 3  . venlafaxine XR (EFFEXOR-XR) 75 MG 24 hr capsule TAKE 1 CAPSULE BY MOUTH DAILY WITH BREAKFAST 90 capsule 2   No current facility-administered medications on file prior to visit.     Past Medical History:  Diagnosis Date  . Anxiety   . Aortic stenosis    Mild to moderate  . Arthritis   . Asthma   . Bicuspid aortic valve   . Coronary atherosclerosis of native coronary artery    Minimal, LVEF 60%  . Essential hypertension, benign   . Melanoma ([place]) 1993  . Mixed hyperlipidemia   . PVC's (premature ventricular contractions)     Past Surgical History:  Procedure Laterality Date  . TONSILLECTOMY      Social History   Social History  . Marital status: Divorced    Spouse name: N/A  . Number of children: N/A  . Years of education: N/A   Social History Main Topics  . Smoking status: Former Smoker    Packs/day: 1.00    Years: 10.00    Types: Cigarettes    Start date: 07/21/2001  . Smokeless tobacco: Never Used  . Alcohol use 25.2  oz/week    42 Standard drinks or equivalent per week     Comment: 4-6 drinks daily, beer/wine  . Drug use: No  . Sexual activity: Not Asked   Other Topics Concern  . None   Social History Narrative  . None    Family History  Problem Relation Age of Onset  . Dementia Mother     Review of Systems  Constitutional: Negative for chills and fever.  HENT: Positive for hearing loss (left). Negative for tinnitus.   Eyes: Negative for visual disturbance.  Respiratory: Negative for cough, shortness of breath and wheezing.   Cardiovascular: Positive for palpitations (occasion). Negative for chest pain and leg  swelling.  Gastrointestinal: Negative for abdominal pain, blood in stool, constipation, diarrhea and nausea.       No gerd  Genitourinary: Negative for dysuria and hematuria.  Musculoskeletal: Positive for arthralgias (occasional knee). Negative for back pain.  Skin: Negative for color change and rash.  Neurological: Negative for light-headedness and headaches.  Psychiatric/Behavioral: Negative for dysphoric mood. The patient is not nervous/anxious.        Objective:   Vitals:   09/20/16 1502  BP: 128/76  Pulse: 91  Resp: 16  Temp: 98.5 F (36.9 C)   Filed Weights   09/20/16 1502  Weight: 244 lb (110.7 kg)   Body mass index is 32.19 kg/m.  Wt Readings from Last 3 Encounters:  09/20/16 244 lb (110.7 kg)  09/08/16 241 lb (109.3 kg)  01/20/16 255 lb (115.7 kg)     Physical Exam Constitutional: He appears well-developed and well-nourished. No distress.  HENT:  Head: Normocephalic and atraumatic.  Right Ear: External ear normal.  Left Ear: External ear normal.  Mouth/Throat: Oropharynx is clear and moist.  Normal ear canals with minimal cerumen bilaterally and normal TM b/l  Eyes: Conjunctivae and EOM are normal.  Neck: Neck supple. No tracheal deviation present. No thyromegaly present.  No carotid bruit  Cardiovascular: Normal rate, regular rhythm, normal  heart sounds and intact distal pulses.   3/6 systolic murmur heard. Pulmonary/Chest: Effort normal and breath sounds normal. No respiratory distress. He has no wheezes. He has no rales.  Abdominal: Soft. Nonobstructive umbilical and ventral hernias. He exhibits no distension. There is no tenderness.  Musculoskeletal: He exhibits no edema.  Lymphadenopathy:   He has no cervical adenopathy.  Skin: Skin is warm and dry. He is not diaphoretic.  Psychiatric: He has a normal mood and affect. His behavior is normal.         Assessment & Plan:   Wellness Exam: Immunizations  Up to date  Colonoscopy  Up to date  Eye exam   Up to date  Hearing loss - left ear - transient - likely eustachian tube dysfunction Memory concerns/difficulties  None - normal recall difficulties Independent of ADLs  fully Stressed the importance of regular exercise   Patient received copy of preventative screening tests/immunizations recommended for the next 5-10 years.  See Problem List for Assessment and Plan of chronic medical problems.

## 2016-09-19 NOTE — Patient Instructions (Addendum)
  Danny Johnson , Thank you for taking time to come for your Medicare Wellness Visit. I appreciate your ongoing commitment to your health goals. Please review the following plan we discussed and let me know if I can assist you in the future.   These are the goals we discussed: Goals    None      This is a list of the screening recommended for you and due dates:  Health Maintenance  Topic Date Due  . Flu Shot  12/22/2016  . Tetanus Vaccine  06/17/2021  . Colon Cancer Screening  07/21/2026  .  Hepatitis C: One time screening is recommended by Center for Disease Control  (CDC) for  adults born from [birth year] through 1967.   Completed  . Pneumonia vaccines  Completed     Test(s) ordered today. Your results will be released to [place] (or called to you) after review, usually within 72hours after test completion. If any changes need to be made, you will be notified at that same time.  All other Health Maintenance issues reviewed.   All recommended immunizations and age-appropriate screenings are up-to-date or discussed.  No immunizations administered today.   Medications reviewed and updated.  Changes include decreasing viagra to 50 mg daily and decreasing effexor to 37.5 mg daily.  Your prescription(s) have been submitted to your pharmacy. Please take as directed and contact our office if you believe you are having problem(s) with the medication(s).    Please followup in one year

## 2016-09-20 ENCOUNTER — Ambulatory Visit (INDEPENDENT_AMBULATORY_CARE_PROVIDER_SITE_OTHER): Payer: Medicare Other | Admitting: Internal Medicine

## 2016-09-20 ENCOUNTER — Encounter: Payer: Self-pay | Admitting: Internal Medicine

## 2016-09-20 VITALS — BP 128/76 | HR 91 | Temp 98.5°F | Resp 16 | Ht 73.0 in | Wt 244.0 lb

## 2016-09-20 DIAGNOSIS — H6982 Other specified disorders of Eustachian tube, left ear: Secondary | ICD-10-CM | POA: Diagnosis not present

## 2016-09-20 DIAGNOSIS — I350 Nonrheumatic aortic (valve) stenosis: Secondary | ICD-10-CM

## 2016-09-20 DIAGNOSIS — R7303 Prediabetes: Secondary | ICD-10-CM | POA: Diagnosis not present

## 2016-09-20 DIAGNOSIS — F411 Generalized anxiety disorder: Secondary | ICD-10-CM | POA: Diagnosis not present

## 2016-09-20 DIAGNOSIS — Z0000 Encounter for general adult medical examination without abnormal findings: Secondary | ICD-10-CM

## 2016-09-20 DIAGNOSIS — I10 Essential (primary) hypertension: Secondary | ICD-10-CM

## 2016-09-20 DIAGNOSIS — E782 Mixed hyperlipidemia: Secondary | ICD-10-CM

## 2016-09-20 DIAGNOSIS — H6992 Unspecified Eustachian tube disorder, left ear: Secondary | ICD-10-CM | POA: Insufficient documentation

## 2016-09-20 DIAGNOSIS — N529 Male erectile dysfunction, unspecified: Secondary | ICD-10-CM | POA: Diagnosis not present

## 2016-09-20 DIAGNOSIS — Z125 Encounter for screening for malignant neoplasm of prostate: Secondary | ICD-10-CM | POA: Diagnosis not present

## 2016-09-20 MED ORDER — SILDENAFIL CITRATE 50 MG PO TABS: 50.0000 mg | ORAL_TABLET | Freq: Every day | ORAL | 5 refills | Status: DC | PRN

## 2016-09-20 MED ORDER — VENLAFAXINE HCL ER 37.5 MG PO CP24: 37.5000 mg | ORAL_CAPSULE | Freq: Every day | ORAL | 5 refills | Status: DC

## 2016-09-20 NOTE — Assessment & Plan Note (Signed)
He had side effects from viagra 100 mg He would like to try a lower dose, 50 mg as needed He experiences further side effects he will discuss his cardiologist

## 2016-09-20 NOTE — Assessment & Plan Note (Signed)
Check a1c Low sugar / carb diet Stressed regular exercise, weight loss  

## 2016-09-20 NOTE — Assessment & Plan Note (Signed)
BP Readings from Last 3 Encounters:  09/20/16 128/76  09/08/16 122/78  01/20/16 126/84   BP well controlled Current regimen effective and well tolerated Continue current medications at current doses

## 2016-09-20 NOTE — Assessment & Plan Note (Signed)
Check lipid panel  Continue daily statin Regular exercise and healthy diet encouraged  

## 2016-09-20 NOTE — Assessment & Plan Note (Signed)
Monitored by cardiology No active symptoms, but was SOB with viagra - if persists he will call cardiology

## 2016-09-20 NOTE — Assessment & Plan Note (Signed)
She deferred medication He will try to pop his ear throughout the day and see if that helps

## 2016-09-20 NOTE — Progress Notes (Signed)
Pre visit review using our clinic review tool, if applicable. No additional management support is needed unless otherwise documented below in the visit note. 

## 2016-09-20 NOTE — Assessment & Plan Note (Signed)
He would like to try to come off medication Alternate venlafaxine 37.5 mg with 75 mg a week and then decrease to 37.5 mg daily for 2 weeks and then stop Call with questions or concerns regarding side effects

## 2016-09-22 ENCOUNTER — Other Ambulatory Visit (INDEPENDENT_AMBULATORY_CARE_PROVIDER_SITE_OTHER): Payer: Medicare Other

## 2016-09-22 DIAGNOSIS — Z125 Encounter for screening for malignant neoplasm of prostate: Secondary | ICD-10-CM

## 2016-09-22 DIAGNOSIS — R7303 Prediabetes: Secondary | ICD-10-CM

## 2016-09-22 DIAGNOSIS — E782 Mixed hyperlipidemia: Secondary | ICD-10-CM

## 2016-09-22 DIAGNOSIS — I10 Essential (primary) hypertension: Secondary | ICD-10-CM | POA: Diagnosis not present

## 2016-09-22 LAB — HEMOGLOBIN A1C: Hgb A1c MFr Bld: 5.9 % (ref 4.6–6.5)

## 2016-09-22 LAB — CBC WITH DIFFERENTIAL/PLATELET
BASOS ABS: 0.1 10*3/uL (ref 0.0–0.1)
Basophils Relative: 1.2 % (ref 0.0–3.0)
EOS ABS: 0.5 10*3/uL (ref 0.0–0.7)
Eosinophils Relative: 7 % — ABNORMAL HIGH (ref 0.0–5.0)
HEMATOCRIT: 48.2 % (ref 39.0–52.0)
Hemoglobin: 16.5 g/dL (ref 13.0–17.0)
LYMPHS PCT: 28.1 % (ref 12.0–46.0)
Lymphs Abs: 2 10*3/uL (ref 0.7–4.0)
MCHC: 34.3 g/dL (ref 30.0–36.0)
MCV: 100.1 fl — AB (ref 78.0–100.0)
MONOS PCT: 10.8 % (ref 3.0–12.0)
Monocytes Absolute: 0.8 10*3/uL (ref 0.1–1.0)
NEUTROS PCT: 52.9 % (ref 43.0–77.0)
Neutro Abs: 3.7 10*3/uL (ref 1.4–7.7)
Platelets: 233 10*3/uL (ref 150.0–400.0)
RBC: 4.82 Mil/uL (ref 4.22–5.81)
RDW: 12.9 % (ref 11.5–15.5)
WBC: 7 10*3/uL (ref 4.0–10.5)

## 2016-09-22 LAB — COMPREHENSIVE METABOLIC PANEL
ALT: 29 U/L (ref 0–53)
AST: 28 U/L (ref 0–37)
Albumin: 4.3 g/dL (ref 3.5–5.2)
Alkaline Phosphatase: 74 U/L (ref 39–117)
BUN: 15 mg/dL (ref 6–23)
CALCIUM: 9.3 mg/dL (ref 8.4–10.5)
CHLORIDE: 104 meq/L (ref 96–112)
CO2: 28 meq/L (ref 19–32)
Creatinine, Ser: 0.89 mg/dL (ref 0.40–1.50)
GFR: 90.43 mL/min (ref >60.00)
Glucose, Bld: 113 mg/dL — ABNORMAL HIGH (ref 70–99)
POTASSIUM: 4 meq/L (ref 3.5–5.1)
Sodium: 139 mEq/L (ref 135–145)
Total Bilirubin: 0.7 mg/dL (ref 0.2–1.2)
Total Protein: 6.5 g/dL (ref 6.0–8.3)

## 2016-09-22 LAB — LIPID PANEL
CHOLESTEROL: 168 mg/dL (ref 0–200)
HDL: 70 mg/dL (ref >39.00)
LDL CALC: 82 mg/dL (ref 0–99)
NonHDL: 97.8
TRIGLYCERIDES: 80 mg/dL (ref 0.0–149.0)
Total CHOL/HDL Ratio: 2
VLDL: 16 mg/dL (ref 0.0–40.0)

## 2016-09-22 LAB — TSH: TSH: 1.8 u[IU]/mL (ref 0.35–4.50)

## 2016-09-22 LAB — PSA, MEDICARE: PSA: 0.51 ng/mL (ref 0.10–4.00)

## 2016-09-25 ENCOUNTER — Encounter: Payer: Self-pay | Admitting: Internal Medicine

## 2016-09-26 DIAGNOSIS — J45901 Unspecified asthma with (acute) exacerbation: Secondary | ICD-10-CM | POA: Diagnosis not present

## 2016-10-01 ENCOUNTER — Telehealth: Payer: Self-pay | Admitting: *Deleted

## 2016-10-01 MED ORDER — MONTELUKAST SODIUM 10 MG PO TABS: 10.0000 mg | ORAL_TABLET | Freq: Every day | ORAL | 3 refills | Status: DC

## 2016-10-01 NOTE — Telephone Encounter (Signed)
sent to [facility]

## 2016-10-01 NOTE — Telephone Encounter (Signed)
Rec'd call pt states st one time he had been taking singulair for her allergies he stop taking med and rx has expired requesting md to send rx to his pharmacy...Danny Johnson

## 2016-10-04 DIAGNOSIS — Z85820 Personal history of malignant melanoma of skin: Secondary | ICD-10-CM | POA: Diagnosis not present

## 2016-10-04 DIAGNOSIS — Z808 Family history of malignant neoplasm of other organs or systems: Secondary | ICD-10-CM | POA: Diagnosis not present

## 2016-10-04 DIAGNOSIS — D1801 Hemangioma of skin and subcutaneous tissue: Secondary | ICD-10-CM | POA: Diagnosis not present

## 2016-10-04 DIAGNOSIS — L814 Other melanin hyperpigmentation: Secondary | ICD-10-CM | POA: Diagnosis not present

## 2016-10-04 NOTE — Telephone Encounter (Signed)
Called pt no answer LMOM MD sent rx to [facility]../lmb 

## 2016-10-08 ENCOUNTER — Encounter: Payer: Self-pay | Admitting: Cardiology

## 2016-10-08 NOTE — Telephone Encounter (Signed)
Probably okay to continue use of Viagra if he has been tolerating it so far, would likely stay with the 50 mg dose. The main concern would be avoiding hypotension, so if he has any unusual dizziness or lightheadedness after using Viagra, he should stop. Unless he has noticed other decline in stamina with typical ADLs including his usual walks and dancing, we can probably continue with the current plan for observation and echocardiogram within the next 6 months.

## 2016-11-07 ENCOUNTER — Encounter: Payer: Self-pay | Admitting: Cardiology

## 2016-11-08 ENCOUNTER — Telehealth: Payer: Self-pay

## 2016-11-08 DIAGNOSIS — H524 Presbyopia: Secondary | ICD-10-CM | POA: Diagnosis not present

## 2016-11-08 MED ORDER — QUINAPRIL HCL 10 MG PO TABS: 10.0000 mg | ORAL_TABLET | Freq: Every day | ORAL | 3 refills | Status: DC

## 2016-11-08 NOTE — Telephone Encounter (Signed)
-----   Message from Rogelia Mire, NP sent at 11/08/2016  9:37 AM EDT ----- Regarding: RE: pt email He just had an echo in April, so I don't think that we need to move that up.  More importantly, he needs to be sure that he is adequately hydrating, esp when going outside and/or playing golf.  His blood pressures are trending on the soft side.  He can reduce his lisinopril to 10 mg daily.  Please arrange for outpt f/u within the next 2 wks.  Thanks,  Gerald Stabs ----- Message ----- From: Bernita Raisin, RN Sent: 11/08/2016   7:35 AM To: Rogelia Mire, NP Subject: pt email                                       From patient email:I have been feeling tired here lately after playing golf. I have also experienced some light-headedness randomly during the day. I have cut back on my walking due to concerns about blacking out and falling. My recent BP readings are120/69, 116/68, 104/74, 110/71, 101\64, 96/68, 104/72, 121/63 and115/67. Do you still want me to wait till October for another echocardiogram?

## 2016-11-08 NOTE — Telephone Encounter (Signed)
Spoke with pt will decrease Accupril to 10 mg and fu 6/29 at 130 pm with Dr Purcell Nails NP

## 2016-11-16 ENCOUNTER — Encounter: Payer: Self-pay | Admitting: Internal Medicine

## 2016-11-17 ENCOUNTER — Other Ambulatory Visit: Payer: Self-pay | Admitting: Emergency Medicine

## 2016-11-17 MED ORDER — SILDENAFIL CITRATE 50 MG PO TABS: 50.0000 mg | ORAL_TABLET | Freq: Every day | ORAL | 5 refills | Status: DC | PRN

## 2016-11-19 ENCOUNTER — Other Ambulatory Visit: Payer: Self-pay | Admitting: Emergency Medicine

## 2016-11-19 ENCOUNTER — Encounter: Payer: Self-pay | Admitting: Internal Medicine

## 2016-11-19 ENCOUNTER — Encounter: Payer: Self-pay | Admitting: Adult Health

## 2016-11-19 ENCOUNTER — Ambulatory Visit (INDEPENDENT_AMBULATORY_CARE_PROVIDER_SITE_OTHER): Payer: Medicare Other | Admitting: Adult Health

## 2016-11-19 VITALS — BP 118/78 | HR 76 | Ht 74.0 in | Wt 253.0 lb

## 2016-11-19 DIAGNOSIS — I10 Essential (primary) hypertension: Secondary | ICD-10-CM

## 2016-11-19 DIAGNOSIS — Q231 Congenital insufficiency of aortic valve: Secondary | ICD-10-CM

## 2016-11-19 DIAGNOSIS — I350 Nonrheumatic aortic (valve) stenosis: Secondary | ICD-10-CM

## 2016-11-19 MED ORDER — SILDENAFIL CITRATE 20 MG PO TABS: 20.0000 mg | ORAL_TABLET | Freq: Three times a day (TID) | ORAL | 0 refills | Status: DC

## 2016-11-19 MED ORDER — VENLAFAXINE HCL 75 MG PO TABS: 75.0000 mg | ORAL_TABLET | Freq: Two times a day (BID) | ORAL | 1 refills | Status: DC

## 2016-11-19 NOTE — Progress Notes (Signed)
Cardiology Office Note   Date:  11/19/2016   ID:  ARRIAN Johnson, DOB [birth year], MRN [id number]  PCP:  Danny Rail, MD  Cardiologist:  Danny Johnson  Chief Complaint  Patient presents with  . Aortic Stenosis      History of Present Illness: Danny Johnson is a 66 y.o. male who presents for ongoing assessment and management of bicuspid aortic valve with moderately severe aortic stenosis, hypertension, hyperlipidemia, and history of tobacco abuse. The patient was last in the office on 09/08/2016 and was to have a follow-up echocardiogram in October 2018, with possibility of need for repair if aortic valve stenosis became more progressive.  The patient called our office on 11/08/2016 with complaints of feeling lightheaded, concern about blacking out or falling while playing golf. This was addressed by Danny Bayley, NP and was told that he did not need a follow-up echo as he just had one in April, he was advised on keeping himself hydrated, he is advised to reduce his lisinopril to 10 mg daily. He is here for follow-up for discussion of ongoing symptoms and for blood pressure evaluation  He comes today with worsening dyspnea. The patient is very concerned about his aortic valve stenosis. He believes his symptoms are worsening. He has called our office to request a repeat echocardiogram although his most recent echocardiogram was in April 2018. The patient states that his blood pressure was elevated at home, and therefore he went back up on quinapril to 20 mg daily.  He also states he had been on Effexor, and stopped that taking this was contributing to his symptoms. Symptoms did not improve and therefore he restarted Effexor. He states that he is able to mow his lawn, play golf, and have normal activities without associated chest pain. He has worsening dyspnea, but also has a history of asthma. He has been using his inhaler more often.   Past Medical History:  Diagnosis Date  . Anxiety   . Aortic  stenosis    Mild to moderate  . Arthritis   . Asthma   . Bicuspid aortic valve   . Coronary atherosclerosis of native coronary artery    Minimal, LVEF 60%  . Essential hypertension, benign   . Melanoma ([place]) 1993  . Mixed hyperlipidemia   . PVC's (premature ventricular contractions)     Past Surgical History:  Procedure Laterality Date  . TONSILLECTOMY       Current Outpatient Prescriptions  Medication Sig Dispense Refill  . albuterol (PROAIR HFA) 108 (90 Base) MCG/ACT inhaler Inhale 2 puffs into the lungs every 6 (six) hours as needed for wheezing or shortness of breath. 3 Inhaler 2  . aspirin 81 MG tablet Take 81 mg by mouth daily.      Marland Kitchen atorvastatin (LIPITOR) 10 MG tablet Take 1 tablet (10 mg total) by mouth daily. 90 tablet 1  . montelukast (SINGULAIR) 10 MG tablet Take 1 tablet (10 mg total) by mouth at bedtime. 90 tablet 3  . quinapril (ACCUPRIL) 20 MG tablet TK 1 T PO  D  0  . venlafaxine (EFFEXOR) 75 MG tablet Take 75 mg by mouth 2 (two) times daily.    . sildenafil (REVATIO) 20 MG tablet Take 1 tablet (20 mg total) by mouth 3 (three) times daily. 10 tablet 0   No current facility-administered medications for this visit.     Allergies:   Patient has no known allergies.    Social History:  The patient  reports that he  has been smoking Cigarettes.  He started smoking about 15 years ago. He has a 10.00 pack-year smoking history. He has never used smokeless tobacco. He reports that he drinks about 25.2 oz of alcohol per week . He reports that he does not use drugs.   Family History:  The patient's family history includes Dementia in his mother.    ROS: All other systems are reviewed and negative. Unless otherwise mentioned in H&P    PHYSICAL EXAM: VS:  BP 118/78   Pulse 76   Ht 6\' 2"  (1.88 m)   Wt 253 lb (114.8 kg)   SpO2 94%   BMI 32.48 kg/m  , BMI Body mass index is 32.48 kg/m. GEN: Well nourished, well developed, in no acute distress  HEENT: normal  Neck:  no JVD, carotid bruits, or masses Cardiac: RRR; Harsh 2/6 holosystolic murmur heard best at the left sternal border, preserved S2 rubs, or gallops,no edema  Respiratory:  Inspiratory wheezes. GI: soft, nontender, nondistended, + BS MS: no deformity or atrophy  Skin: warm and dry, no rash Neuro:  Strength and sensation are intact Psych: euthymic mood, full affect   Recent Labs: 09/22/2016: ALT 29; BUN 15; Creatinine, Ser 0.89; Hemoglobin 16.5; Platelets 233.0; Potassium 4.0; Sodium 139; TSH 1.80    Lipid Panel    Component Value Date/Time   CHOL 168 09/22/2016 0745   TRIG 80.0 09/22/2016 0745   TRIG 89 10/25/2008   HDL 70.00 09/22/2016 0745   CHOLHDL 2 09/22/2016 0745   VLDL 16.0 09/22/2016 0745   LDLCALC 82 09/22/2016 0745   LDLDIRECT 153.1 06/29/2012 0929      Wt Readings from Last 3 Encounters:  11/19/16 253 lb (114.8 kg)  09/20/16 244 lb (110.7 kg)  09/08/16 241 lb (109.3 kg)      Other studies Reviewed: Echocardiogram 2016/09/05 Left ventricle: The cavity size was normal. Wall thickness was   increased in a pattern of mild LVH. Systolic function was normal.   The estimated ejection fraction was in the range of 60% to 65%.   Doppler parameters are consistent with abnormal left ventricular   relaxation (grade 1 diastolic dysfunction). - Regional wall motion abnormality: Hypokinesis of the mid   inferoseptal and mid inferior myocardium. - Aortic valve: Severely calcified leaflets. There was moderate to   severe stenosis. Peak velocity (S): 351 cm/s. Mean gradient (S):   26 mm Hg. Valve area (VTI): 0.97 cm^2. Valve area (Vmax): 0.89   cm^2. Valve area (Vmean): 0.96 cm^2. - Mitral valve: Calcified annulus.   ASSESSMENT AND PLAN:  1. Moderate to severe aortic valve stenosis: Most recent echocardiogram dated April 2018 revealed severely calcified leaflets with moderate to severe stenosis. Valve area by VTI 0.97 cm, valve area valve area by Vmax 0.89 cm. Valve area by V  mean 0.96 cm. I reassured the patient that if he is not having chest pain with exertion, i.e. mowing the lawn or playing golf, he does not have worsening shortness of breath, or worsening dizziness is not likely that his aortic valve stenosis is significantly symptomatic. The patient is extremely anxious about this. I'll repeat his echocardiogram although I doubt that they'll be much difference in the last 2 months. If there is no difference, I do not feel the need to send him for evaluation for repair at this time. He will follow-up with Dr. Domenic Johnson for further discussion and treatment management.  2. Hypertension: The patient states his blood pressure is elevated at home. His blood pressure  cuff is approximately 68 years old. Have advised him get a new one. Blood pressure today is well-controlled at 118/78. However, the patient increased his quinapril on his own back to 20 mg daily from 10 mg which he was advised to take in the past 2 weeks due to symptoms of dizziness. He is staying hydrated. We'll follow blood pressure.  3. ED: He is tried several times to get ahold his primary care physician for refills on a lower dose of Viagra from 50 mg to 20 mg. I have given him 10 tablets, that have reinforced that he will need to see his primary care physician for refills and management as cardiology will not be treating him for this. He verbalizes understanding.   Current medicines are reviewed at length with the patient today.    Labs/ tests ordered today include: EchoPhill Myron. West Pugh, ANP, AACC   11/19/2016 1:56 PM    [facility] [address] [place], [place] [address] Phone: [phone]; Fax: [phone]

## 2016-11-19 NOTE — Patient Instructions (Addendum)
Your physician recommends that you schedule a follow-up appointment in: 1 Month with Dr. Domenic Polite   Your physician recommends that you continue on your current medications as directed. Please refer to the Current Medication list given to you today.  Start Viagra 20 mg Refills to be managed by PCP   Your physician has requested that you have an echocardiogram. Echocardiography is a painless test that uses sound waves to create images of your heart. It provides your doctor with information about the size and shape of your heart and how well your heart's chambers and valves are working. This procedure takes approximately one hour. There are no restrictions for this procedure.  If you need a refill on your cardiac medications before your next appointment, please call your pharmacy.  Thank you for choosing [facility]!

## 2016-11-20 MED ORDER — SILDENAFIL CITRATE 20 MG PO TABS: ORAL_TABLET | ORAL | 5 refills | Status: DC

## 2016-11-22 ENCOUNTER — Ambulatory Visit: Payer: Medicare Other | Admitting: Adult Health

## 2016-11-23 ENCOUNTER — Telehealth: Payer: Self-pay

## 2016-11-23 NOTE — Telephone Encounter (Signed)
PA started [facility]

## 2016-11-25 ENCOUNTER — Telehealth: Payer: Self-pay

## 2016-11-25 MED ORDER — SILDENAFIL CITRATE 20 MG PO TABS: ORAL_TABLET | ORAL | 5 refills | Status: DC

## 2016-11-25 NOTE — Telephone Encounter (Signed)
Pt stated he wants Sildenafil 20mg  Tabs sent to [facility] due to costs. Sending now

## 2016-11-25 NOTE — Telephone Encounter (Signed)
PA-Denied patient contacted

## 2016-11-30 ENCOUNTER — Other Ambulatory Visit: Payer: Self-pay | Admitting: Emergency Medicine

## 2016-11-30 ENCOUNTER — Ambulatory Visit (HOSPITAL_COMMUNITY)
Admission: RE | Admit: 2016-11-30 | Discharge: 2016-11-30 | Disposition: A | Discharge status: Home / Self Care | Payer: Medicare Other | Source: Ambulatory Visit | Attending: Adult Health | Admitting: Adult Health

## 2016-11-30 DIAGNOSIS — I493 Ventricular premature depolarization: Secondary | ICD-10-CM | POA: Diagnosis not present

## 2016-11-30 DIAGNOSIS — I2510 Atherosclerotic heart disease of native coronary artery without angina pectoris: Secondary | ICD-10-CM | POA: Insufficient documentation

## 2016-11-30 DIAGNOSIS — E785 Hyperlipidemia, unspecified: Secondary | ICD-10-CM | POA: Insufficient documentation

## 2016-11-30 DIAGNOSIS — I10 Essential (primary) hypertension: Secondary | ICD-10-CM | POA: Diagnosis not present

## 2016-11-30 DIAGNOSIS — I350 Nonrheumatic aortic (valve) stenosis: Secondary | ICD-10-CM | POA: Diagnosis not present

## 2016-11-30 DIAGNOSIS — R7303 Prediabetes: Secondary | ICD-10-CM | POA: Diagnosis not present

## 2016-11-30 LAB — ECHOCARDIOGRAM COMPLETE
AO mean calculated velocity dopler: 237 cm/s
AOPV: 0.25 m/s
AOVTI: 79.8 cm
AV Area VTI: 0.78 cm2
AV Peak grad: 50 mmHg
AV area mean vel ind: 0.34 cm2/m2
AV vel: 0.89
AVAREAMEANV: 0.83 cm2
AVAREAVTIIND: 0.36 cm2/m2
AVCELMEANRAT: 0.27
AVG: 27 mmHg
AVPKVEL: 353 cm/s
CHL CUP AV PEAK INDEX: 0.31
CHL CUP MV DEC (S): 271
CHL CUP STROKE VOLUME: 61 mL
E decel time: 271 msec
E/e' ratio: 6.53
FS: 38 % (ref 28–44)
IVS/LV PW RATIO, ED: 1.02
LA diam end sys: 41 mm
LA diam index: 1.65 cm/m2
LA vol A4C: 52.3 ml
LA vol index: 26.6 mL/m2
LA vol: 66 mL
LASIZE: 41 mm
LV E/e' medial: 6.53
LV E/e'average: 6.53
LV dias vol: 95 mL (ref 62–150)
LV e' LATERAL: 11.3 cm/s
LV sys vol: 34 mL
LVDIAVOLIN: 38 mL/m2
LVOT SV: 71 mL
LVOT VTI: 22.7 cm
LVOT area: 3.14 cm2
LVOT peak VTI: 0.28 cm
LVOT peak grad rest: 3 mmHg
LVOTD: 20 mm
LVOTPV: 87.2 cm/s
LVSYSVOLIN: 14 mL/m2
Lateral S' vel: 13.2 cm/s
MVPG: 2 mmHg
MVPKAVEL: 65.5 m/s
MVPKEVEL: 73.8 m/s
PW: 12.5 mm — AB (ref 0.6–1.1)
Simpson's disk: 65
TAPSE: 18.8 mm
TDI e' lateral: 11.3
TDI e' medial: 8.81
Valve area index: 0.36
Valve area: 0.89 cm2

## 2016-11-30 MED ORDER — ATORVASTATIN CALCIUM 10 MG PO TABS: 10.0000 mg | ORAL_TABLET | Freq: Every day | ORAL | 1 refills | Status: DC

## 2016-11-30 NOTE — Progress Notes (Signed)
*  PRELIMINARY RESULTS* Echocardiogram 2D Echocardiogram has been performed.  Samuel Germany 11/30/2016, 3:06 PM

## 2016-12-03 ENCOUNTER — Telehealth: Payer: Self-pay | Admitting: Internal Medicine

## 2016-12-03 MED ORDER — EFFEXOR XR 75 MG PO CP24: 75.0000 mg | ORAL_CAPSULE | Freq: Every day | ORAL | 1 refills | Status: DC

## 2016-12-03 NOTE — Telephone Encounter (Signed)
[facility] called  and would like a new Rx for venlafaxine (EFFEXOR) 75 MG tablet  Brand only, Pt states he can only take brand only Please send to [facility]

## 2016-12-07 ENCOUNTER — Telehealth: Payer: Self-pay | Admitting: Emergency Medicine

## 2016-12-07 NOTE — Telephone Encounter (Signed)
PA has been completed for Effexor Brand. Awaiting approval.   KEY [id number]

## 2016-12-16 NOTE — Telephone Encounter (Signed)
PA has been approved

## 2016-12-20 NOTE — Progress Notes (Deleted)
Cardiology Office Note  Date: 12/20/2016   ID: Danny Johnson, DOB [birth year], MRN [id number]  PCP: Binnie Rail, MD  Primary Cardiologist: Rozann Lesches, MD   No chief complaint on file.   History of Present Illness: Danny Johnson is a 66 y.o. male last seen in the office by Ms. West Pugh in late June.  Echocardiogram done recently in mid July showed LVEF 55-60% with moderate aortic stenosis, mean gradient 27 mmHg.  Past Medical History:  Diagnosis Date  . Anxiety   . Aortic stenosis    Mild to moderate  . Arthritis   . Asthma   . Bicuspid aortic valve   . Coronary atherosclerosis of native coronary artery    Minimal, LVEF 60%  . Essential hypertension, benign   . Melanoma ([place]) 1993  . Mixed hyperlipidemia   . PVC's (premature ventricular contractions)     Past Surgical History:  Procedure Laterality Date  . TONSILLECTOMY      Current Outpatient Prescriptions  Medication Sig Dispense Refill  . albuterol (PROAIR HFA) 108 (90 Base) MCG/ACT inhaler Inhale 2 puffs into the lungs every 6 (six) hours as needed for wheezing or shortness of breath. 3 Inhaler 2  . aspirin 81 MG tablet Take 81 mg by mouth daily.      Marland Kitchen atorvastatin (LIPITOR) 10 MG tablet Take 1 tablet (10 mg total) by mouth daily. 90 tablet 1  . EFFEXOR XR 75 MG 24 hr capsule Take 1 capsule (75 mg total) by mouth daily with breakfast. 90 capsule 1  . montelukast (SINGULAIR) 10 MG tablet Take 1 tablet (10 mg total) by mouth at bedtime. 90 tablet 3  . quinapril (ACCUPRIL) 20 MG tablet TK 1 T PO  D  0  . sildenafil (REVATIO) 20 MG tablet Take 3-5 tabs po daily prn 30 tablet 5   No current facility-administered medications for this visit.    Allergies:  Patient has no known allergies.   Social History: The patient  reports that he has been smoking Cigarettes.  He started smoking about 15 years ago. He has a 10.00 pack-year smoking history. He has never used smokeless tobacco. He reports that he drinks  about 25.2 oz of alcohol per week . He reports that he does not use drugs.   Family History: The patient's family history includes Dementia in his mother.   ROS:  Please see the history of present illness. Otherwise, complete review of systems is positive for {NONE DEFAULTED:18576::"none"}.  All other systems are reviewed and negative.   Physical Exam: VS:  There were no vitals taken for this visit., BMI There is no height or weight on file to calculate BMI.  Wt Readings from Last 3 Encounters:  11/19/16 253 lb (114.8 kg)  09/20/16 244 lb (110.7 kg)  09/08/16 241 lb (109.3 kg)    Patient comfortable in no acute distress.  HEENT: Conjunctiva and lids normal, oropharynx clear.  Neck: Supple, no carotid bruits or thyromegaly.  Lungs: Clear to auscultation.  Cardiac: Regular rate and rhythm, 2/6 systolic murmur at the base, second heart sound preserved, no S3 gallop.  Abdomen: Soft, nontender.  Extremity: No pitting edema.  Skin: Warm and dry. [place] skeletal: No kyphosis. Neuropsychiatric: Alert and oriented x3, affect appropriate.  ECG: I personally reviewed the tracing from 09/08/2016 which showed sinus rhythm with R' in lead V1.  Recent Labwork: 09/22/2016: ALT 29; AST 28; BUN 15; Creatinine, Ser 0.89; Hemoglobin 16.5; Platelets 233.0; Potassium 4.0; Sodium  139; TSH 1.80     Component Value Date/Time   CHOL 168 09/22/2016 0745   TRIG 80.0 09/22/2016 0745   TRIG 89 10/25/2008   HDL 70.00 09/22/2016 0745   CHOLHDL 2 09/22/2016 0745   VLDL 16.0 09/22/2016 0745   LDLCALC 82 09/22/2016 0745   LDLDIRECT 153.1 06/29/2012 0929    Other Studies Reviewed Today:  Echocardiogram 11/30/2016: Study Conclusions  - Left ventricle: The cavity size was normal. Wall thickness was   increased increased in a pattern of mild to moderate LVH.   Systolic function was normal. The estimated ejection fraction was   in the range of 55% to 60%. Wall motion was normal; there were no   regional  wall motion abnormalities. Left ventricular diastolic   function parameters were normal. - Aortic valve: Severely calcified annulus. Severely thickened   leaflets. There was moderate stenosis. Mean gradient (S): 27 mm   Hg. VTI ratio of LVOT to aortic valve: 0.28. Valve area (VTI):   1.18 cm^2. Valve area (Vmax): 1.03 cm^2. - Systemic veins: IVC is small, suggesting low RA pressure and   hypovolemia. - Technically adequate study.  Assessment and Plan:   Current medicines were reviewed with the patient today.  No orders of the defined types were placed in this encounter.   Disposition:  Signed, Satira Sark, MD, [facility] 12/20/2016 12:54 PM    [facility] at [facility] [address] [place], [place] [address] Phone: [phone]; Fax: [phone]

## 2016-12-21 ENCOUNTER — Ambulatory Visit: Payer: Medicare Other | Admitting: Cardiology

## 2016-12-23 DIAGNOSIS — Z808 Family history of malignant neoplasm of other organs or systems: Secondary | ICD-10-CM | POA: Diagnosis not present

## 2016-12-23 DIAGNOSIS — L608 Other nail disorders: Secondary | ICD-10-CM | POA: Diagnosis not present

## 2016-12-23 DIAGNOSIS — Z85820 Personal history of malignant melanoma of skin: Secondary | ICD-10-CM | POA: Diagnosis not present

## 2016-12-31 ENCOUNTER — Other Ambulatory Visit: Payer: Self-pay | Admitting: Emergency Medicine

## 2016-12-31 MED ORDER — QUINAPRIL HCL 20 MG PO TABS: 20.0000 mg | ORAL_TABLET | Freq: Every day | ORAL | 1 refills | Status: DC

## 2017-02-22 DIAGNOSIS — H43812 Vitreous degeneration, left eye: Secondary | ICD-10-CM | POA: Diagnosis not present

## 2017-02-22 DIAGNOSIS — H524 Presbyopia: Secondary | ICD-10-CM | POA: Diagnosis not present

## 2017-02-22 DIAGNOSIS — H3562 Retinal hemorrhage, left eye: Secondary | ICD-10-CM | POA: Diagnosis not present

## 2017-02-22 DIAGNOSIS — H31091 Other chorioretinal scars, right eye: Secondary | ICD-10-CM | POA: Diagnosis not present

## 2017-02-22 DIAGNOSIS — H4312 Vitreous hemorrhage, left eye: Secondary | ICD-10-CM | POA: Diagnosis not present

## 2017-02-22 DIAGNOSIS — H4310 Vitreous hemorrhage, unspecified eye: Secondary | ICD-10-CM | POA: Diagnosis not present

## 2017-02-24 ENCOUNTER — Encounter (HOSPITAL_COMMUNITY): Payer: Self-pay

## 2017-02-24 ENCOUNTER — Ambulatory Visit (HOSPITAL_COMMUNITY)
Admission: RE | Admit: 2017-02-24 | Discharge: 2017-02-24 | Disposition: A | Discharge status: Home / Self Care | Payer: Medicare Other | Source: Ambulatory Visit | Attending: Cardiology | Admitting: Cardiology

## 2017-02-24 DIAGNOSIS — I350 Nonrheumatic aortic (valve) stenosis: Secondary | ICD-10-CM

## 2017-02-28 DIAGNOSIS — Z23 Encounter for immunization: Secondary | ICD-10-CM | POA: Diagnosis not present

## 2017-03-07 DIAGNOSIS — H4312 Vitreous hemorrhage, left eye: Secondary | ICD-10-CM | POA: Diagnosis not present

## 2017-03-07 DIAGNOSIS — H43812 Vitreous degeneration, left eye: Secondary | ICD-10-CM | POA: Diagnosis not present

## 2017-03-07 DIAGNOSIS — H3562 Retinal hemorrhage, left eye: Secondary | ICD-10-CM | POA: Diagnosis not present

## 2017-03-07 DIAGNOSIS — H31091 Other chorioretinal scars, right eye: Secondary | ICD-10-CM | POA: Diagnosis not present

## 2017-04-19 ENCOUNTER — Other Ambulatory Visit: Payer: Self-pay | Admitting: Internal Medicine

## 2017-04-20 ENCOUNTER — Encounter: Payer: Self-pay | Admitting: Internal Medicine

## 2017-04-20 ENCOUNTER — Other Ambulatory Visit: Payer: Self-pay | Admitting: Emergency Medicine

## 2017-04-20 MED ORDER — EFFEXOR XR 75 MG PO CP24: 75.0000 mg | ORAL_CAPSULE | Freq: Every day | ORAL | 0 refills | Status: DC

## 2017-04-21 DIAGNOSIS — H3562 Retinal hemorrhage, left eye: Secondary | ICD-10-CM | POA: Diagnosis not present

## 2017-04-21 DIAGNOSIS — D3131 Benign neoplasm of right choroid: Secondary | ICD-10-CM | POA: Diagnosis not present

## 2017-04-21 DIAGNOSIS — H43813 Vitreous degeneration, bilateral: Secondary | ICD-10-CM | POA: Diagnosis not present

## 2017-04-21 DIAGNOSIS — H4312 Vitreous hemorrhage, left eye: Secondary | ICD-10-CM | POA: Diagnosis not present

## 2017-04-22 ENCOUNTER — Telehealth: Payer: Self-pay | Admitting: Internal Medicine

## 2017-04-22 MED ORDER — VENLAFAXINE HCL ER 75 MG PO CP24: 75.0000 mg | ORAL_CAPSULE | Freq: Every day | ORAL | 0 refills | Status: DC

## 2017-04-22 NOTE — Telephone Encounter (Signed)
Resent RX to pharmacy 

## 2017-04-22 NOTE — Telephone Encounter (Signed)
Please advise 

## 2017-04-22 NOTE — Telephone Encounter (Signed)
Copied from [place] [id number]. Topic: Quick Communication - See Telephone Encounter >> Apr 22, 2017 11:49 AM Cleaster Corin, NT wrote: CRM for notification. See Telephone encounter for:   04/22/17. Boyce called and upon pts. Request he is asking for the generic med. Of Effexor Xr 75mg  24 hour capsule ($500) please give pharmacy a call or fax over new rx.   [place] [address] [place] [address] Phone: [phone] Fax: [phone] Not a 24 hour pharmacy; exact hours not known

## 2017-04-24 ENCOUNTER — Other Ambulatory Visit: Payer: Self-pay | Admitting: Internal Medicine

## 2017-04-26 NOTE — Telephone Encounter (Signed)
Are you okay with changing RX

## 2017-04-28 ENCOUNTER — Other Ambulatory Visit: Payer: Self-pay | Admitting: Internal Medicine

## 2017-04-28 ENCOUNTER — Encounter: Payer: Self-pay | Admitting: Internal Medicine

## 2017-04-28 NOTE — Telephone Encounter (Signed)
Yes - I am not sure if he changed the dose or what but ok to send

## 2017-05-08 ENCOUNTER — Other Ambulatory Visit: Payer: Self-pay | Admitting: Internal Medicine

## 2017-05-26 DIAGNOSIS — H3562 Retinal hemorrhage, left eye: Secondary | ICD-10-CM | POA: Diagnosis not present

## 2017-05-26 DIAGNOSIS — H31091 Other chorioretinal scars, right eye: Secondary | ICD-10-CM | POA: Diagnosis not present

## 2017-05-26 DIAGNOSIS — H43813 Vitreous degeneration, bilateral: Secondary | ICD-10-CM | POA: Diagnosis not present

## 2017-05-26 DIAGNOSIS — H4312 Vitreous hemorrhage, left eye: Secondary | ICD-10-CM | POA: Diagnosis not present

## 2017-06-03 ENCOUNTER — Other Ambulatory Visit: Payer: Self-pay | Admitting: Emergency Medicine

## 2017-06-03 MED ORDER — ATORVASTATIN CALCIUM 10 MG PO TABS: 10.0000 mg | ORAL_TABLET | Freq: Every day | ORAL | 0 refills | Status: DC

## 2017-07-25 NOTE — Progress Notes (Signed)
Cardiology Office Note  Date: 07/26/2017   ID: Danny Johnson, DOB [birth year], MRN [id number]  PCP: Binnie Rail, MD  Primary Cardiologist: Rozann Lesches, MD   Chief Complaint  Patient presents with  . Aortic Stenosis    History of Present Illness: Danny Johnson is a 67 y.o. male last seen by Ms. West Pugh in June 2018.  He presents for a routine follow-up visit.  He states that he has had no angina symptoms, no increasing shortness of breath or decrease in stamina.  He plays golf when he can, also walks 3 miles at least 3 days a week.  Echocardiogram from July 2018 revealed LVEF 55-60% with evidence of moderate aortic stenosis, mean gradient 27 mmHg.  I reviewed these results with him today.  I personally reviewed his ECG today which shows normal sinus rhythm.  We went over his current medications.  He reports no intolerances, blood pressure is reasonably well controlled today.  Past Medical History:  Diagnosis Date  . Anxiety   . Aortic stenosis    Mild to moderate  . Arthritis   . Asthma   . Bicuspid aortic valve   . Coronary atherosclerosis of native coronary artery    Minimal, LVEF 60%  . Essential hypertension, benign   . Melanoma ([place]) 1993  . Mixed hyperlipidemia   . PVC's (premature ventricular contractions)     Past Surgical History:  Procedure Laterality Date  . TONSILLECTOMY      Current Outpatient Medications  Medication Sig Dispense Refill  . albuterol (PROAIR HFA) 108 (90 Base) MCG/ACT inhaler Inhale 2 puffs into the lungs every 6 (six) hours as needed for wheezing or shortness of breath. 3 Inhaler 2  . aspirin 81 MG tablet Take 81 mg by mouth daily.      Marland Kitchen atorvastatin (LIPITOR) 10 MG tablet Take 1 tablet (10 mg total) by mouth daily. -- Office visit needed for further refills 90 tablet 0  . quinapril (ACCUPRIL) 20 MG tablet Take 1 tablet (20 mg total) by mouth daily. 90 tablet 1  . sildenafil (REVATIO) 20 MG tablet Take 3-5 tabs po daily prn  30 tablet 5  . venlafaxine XR (EFFEXOR-XR) 37.5 MG 24 hr capsule TAKE 1 CAPSULE BY MOUTH ONCE DAILY WITH BREAKFAST 90 capsule 1  . VENTOLIN HFA 108 (90 Base) MCG/ACT inhaler INHALE 2 PUFFS BY MOUTH INTO THE LUNGS EVERY 6 HOURS AS NEEDED FOR WHEEZING OR SHORTNESS OF BREATH 54 g 0   No current facility-administered medications for this visit.    Allergies:  Patient has no known allergies.   Social History: The patient  reports that he has been smoking cigarettes.  He started smoking about 16 years ago. He has a 10.00 pack-year smoking history. he has never used smokeless tobacco. He reports that he drinks about 25.2 oz of alcohol per week. He reports that he does not use drugs.   ROS:  Please see the history of present illness. Otherwise, complete review of systems is positive for erectile dysfunction.  All other systems are reviewed and negative.   Physical Exam: VS:  BP 138/82   Pulse 65   Ht 6\' 2"  (1.88 m)   Wt 256 lb (116.1 kg)   SpO2 97%   BMI 32.87 kg/m , BMI Body mass index is 32.87 kg/m.  Wt Readings from Last 3 Encounters:  07/26/17 256 lb (116.1 kg)  11/19/16 253 lb (114.8 kg)  09/20/16 244 lb (110.7 kg)  General: Patient appears comfortable at rest. HEENT: Conjunctiva and lids normal, oropharynx clear. Neck: Supple, no elevated JVP or carotid bruits, no thyromegaly. Lungs: Clear to auscultation, nonlabored breathing at rest. Cardiac: Regular rate and rhythm, no S3, 3/6 systolic murmur. Abdomen: Soft, nontender, bowel sounds present. Extremities: No pitting edema, distal pulses 2+. Skin: Warm and dry. Musculoskeletal: No kyphosis. Neuropsychiatric: Alert and oriented x3, affect grossly appropriate.  ECG: I personally reviewed the tracing from 09/08/2016 which showed sinus rhythm with R' in lead V1.  Recent Labwork: 09/22/2016: ALT 29; AST 28; BUN 15; Creatinine, Ser 0.89; Hemoglobin 16.5; Platelets 233.0; Potassium 4.0; Sodium 139; TSH 1.80     Component Value  Date/Time   CHOL 168 09/22/2016 0745   TRIG 80.0 09/22/2016 0745   TRIG 89 10/25/2008   HDL 70.00 09/22/2016 0745   CHOLHDL 2 09/22/2016 0745   VLDL 16.0 09/22/2016 0745   LDLCALC 82 09/22/2016 0745   LDLDIRECT 153.1 06/29/2012 0929    Other Studies Reviewed Today:  Echocardiogram 11/30/2016: Study Conclusions  - Left ventricle: The cavity size was normal. Wall thickness was   increased increased in a pattern of mild to moderate LVH.   Systolic function was normal. The estimated ejection fraction was   in the range of 55% to 60%. Wall motion was normal; there were no   regional wall motion abnormalities. Left ventricular diastolic   function parameters were normal. - Aortic valve: Severely calcified annulus. Severely thickened   leaflets. There was moderate stenosis. Mean gradient (S): 27 mm   Hg. VTI ratio of LVOT to aortic valve: 0.28. Valve area (VTI):   1.18 cm^2. Valve area (Vmax): 1.03 cm^2. - Systemic veins: IVC is small, suggesting low RA pressure and   hypovolemia. - Technically adequate study.  Assessment and Plan:  1.  Bicuspid aortic valve with moderate aortic stenosis based on most recent echocardiogram from July 2018, mean gradient 27 mmHg.  He reports no decline in stamina or new symptoms.  We will follow-up with an echocardiogram this summer with visit for review.  2.  Essential hypertension, continues on Accupril.  Keep follow-up with PCP.  Maintain regular exercise plan.  3.  Mixed hyperlipidemia, on Lipitor.  4.  Tobacco abuse in remission.  Current medicines were reviewed with the patient today.   Orders Placed This Encounter  Procedures  . EKG 12-Lead  . ECHOCARDIOGRAM COMPLETE    Disposition: Follow-up in 6 months.  Signed, Satira Sark, MD, [facility] 07/26/2017 2:29 PM    [facility] [place], [place], [place] [address] Phone: [phone]; Fax: [phone]

## 2017-07-26 ENCOUNTER — Encounter: Payer: Self-pay | Admitting: Cardiology

## 2017-07-26 ENCOUNTER — Ambulatory Visit (INDEPENDENT_AMBULATORY_CARE_PROVIDER_SITE_OTHER): Payer: Medicare Other | Admitting: Cardiology

## 2017-07-26 VITALS — BP 138/82 | HR 65 | Ht 74.0 in | Wt 256.0 lb

## 2017-07-26 DIAGNOSIS — I10 Essential (primary) hypertension: Secondary | ICD-10-CM | POA: Diagnosis not present

## 2017-07-26 DIAGNOSIS — E782 Mixed hyperlipidemia: Secondary | ICD-10-CM

## 2017-07-26 DIAGNOSIS — F17201 Nicotine dependence, unspecified, in remission: Secondary | ICD-10-CM | POA: Diagnosis not present

## 2017-07-26 DIAGNOSIS — I350 Nonrheumatic aortic (valve) stenosis: Secondary | ICD-10-CM | POA: Diagnosis not present

## 2017-07-26 NOTE — Patient Instructions (Addendum)
Medication Instructions:  Your physician recommends that you continue on your current medications as directed. Please refer to the Current Medication list given to you today.  Labwork: NONE  Testing/Procedures: Your physician has requested that you have an echocardiogram IN 6 MONTHS. Echocardiography is a painless test that uses sound waves to create images of your heart. It provides your doctor with information about the size and shape of your heart and how well your heart's chambers and valves are working. This procedure takes approximately one hour. There are no restrictions for this procedure.  Follow-Up: Your physician wants you to follow-up in: 6 MONTHS WITH DR. MCDOWELL. You will receive a reminder letter in the mail two months in advance. If you don't receive a letter, please call our office to schedule the follow-up appointment.  Any Other Special Instructions Will Be Listed Below (If Applicable).  If you need a refill on your cardiac medications before your next appointment, please call your pharmacy. 

## 2017-08-15 ENCOUNTER — Other Ambulatory Visit: Payer: Self-pay | Admitting: Internal Medicine

## 2017-08-24 ENCOUNTER — Other Ambulatory Visit: Payer: Self-pay | Admitting: Emergency Medicine

## 2017-08-24 MED ORDER — ATORVASTATIN CALCIUM 10 MG PO TABS: 10.0000 mg | ORAL_TABLET | Freq: Every day | ORAL | 0 refills | Status: DC

## 2017-09-02 ENCOUNTER — Other Ambulatory Visit: Payer: Self-pay | Admitting: Internal Medicine

## 2017-09-13 ENCOUNTER — Other Ambulatory Visit: Payer: Self-pay | Admitting: Internal Medicine

## 2017-09-17 ENCOUNTER — Other Ambulatory Visit: Payer: Self-pay | Admitting: Internal Medicine

## 2017-09-25 NOTE — Progress Notes (Signed)
Subjective:    Patient ID: Danny Johnson, male    DOB: 10-25-1950, 67 y.o.   MRN: [id number]  HPI He is here for a physical exam.      He woke up with a sore throat this morning.    He has some tenderness in his left neck.   It improves once he pops his ears.  This is not new.  He currently does not have any tenderness today.  He thinks he may have a hernia.  If he coughs he feels a pulling sensation in his right suprapubic / groin region - it has been a burning sensation a couple of times.  He denies a bulge.   He was at the beach recently.  He sweats a lot and got dehydrated.  He does not drink enough.  He was drinking alcohol at the time and thinks that made it worse.  He did feel lightheaded at that time, but has not had any problems since then   Wheezing and cough form allergies.  Stopped flonase - nose bleeding.      Medications and allergies reviewed with patient and updated if appropriate.  Patient Active Problem List   Diagnosis Date Noted  . Prediabetes 10/07/2015  . Coronary atherosclerosis of native coronary artery 07/09/2011  . ERECTILE DYSFUNCTION, ORGANIC 11/13/2009  . ALLERGIC RHINITIS 06/19/2009  . Aortic stenosis 11/05/2008  . Hyperlipidemia 03/18/2008  . Anxiety state 03/18/2008  . Essential hypertension 03/18/2008  . PVC's (premature ventricular contractions) 03/18/2008    Current Outpatient Medications on File Prior to Visit  Medication Sig Dispense Refill  . albuterol (PROAIR HFA) 108 (90 Base) MCG/ACT inhaler Inhale 2 puffs into the lungs every 6 (six) hours as needed for wheezing or shortness of breath. 3 Inhaler 2  . aspirin 81 MG tablet Take 81 mg by mouth daily.      Marland Kitchen atorvastatin (LIPITOR) 10 MG tablet Take 1 tablet (10 mg total) by mouth daily. 90 tablet 0  . quinapril (ACCUPRIL) 20 MG tablet TAKE 1 TABLET BY MOUTH DAILY 90 tablet 0  . sildenafil (REVATIO) 20 MG tablet TAKE 3 TO 5 TABLETS BY MOUTH DAILY AS NEEDED 30 tablet 0  . venlafaxine  XR (EFFEXOR-XR) 37.5 MG 24 hr capsule TAKE 1 CAPSULE BY MOUTH ONCE DAILY WITH BREAKFAST 90 capsule 1  . VENTOLIN HFA 108 (90 Base) MCG/ACT inhaler INHALE 2 PUFFS BY MOUTH INTO THE LUNGS EVERY 6 HOURS AS NEEDED FOR WHEEZING OR SHORTNESS OF BREATH 54 g 0   No current facility-administered medications on file prior to visit.     Past Medical History:  Diagnosis Date  . Anxiety   . Aortic stenosis    Mild to moderate  . Arthritis   . Asthma   . Bicuspid aortic valve   . Coronary atherosclerosis of native coronary artery    Minimal, LVEF 60%  . Essential hypertension, benign   . Melanoma ([place]) 1993  . Mixed hyperlipidemia   . PVC's (premature ventricular contractions)     Past Surgical History:  Procedure Laterality Date  . TONSILLECTOMY      Social History   Socioeconomic History  . Marital status: Divorced    Spouse name: Not on file  . Number of children: Not on file  . Years of education: Not on file  . Highest education level: Not on file  Occupational History  . Not on file  Social Needs  . Financial resource strain: Not on file  .  Food insecurity:    Worry: Not on file    Inability: Not on file  . Transportation needs:    Medical: Not on file    Non-medical: Not on file  Tobacco Use  . Smoking status: Current Some Day Smoker    Packs/day: 1.00    Years: 10.00    Pack years: 10.00    Types: Cigarettes    Start date: 07/21/2001  . Smokeless tobacco: Never Used  Substance and Sexual Activity  . Alcohol use: Yes    Alcohol/week: 25.2 oz    Types: 42 Standard drinks or equivalent per week    Comment: 4-6 drinks daily, beer/wine  . Drug use: No  . Sexual activity: Not on file  Lifestyle  . Physical activity:    Days per week: Not on file    Minutes per session: Not on file  . Stress: Not on file  Relationships  . Social connections:    Talks on phone: Not on file    Gets together: Not on file    Attends religious service: Not on file    Active member  of club or organization: Not on file    Attends meetings of clubs or organizations: Not on file    Relationship status: Not on file  Other Topics Concern  . Not on file  Social History Narrative  . Not on file    Family History  Problem Relation Age of Onset  . Dementia Mother     Review of Systems  Constitutional: Negative for chills and fever.  HENT: Positive for congestion (chronic), sinus pressure and sore throat (since this morning). Negative for ear pain.   Eyes: Negative for visual disturbance.  Respiratory: Positive for cough (allergies) and wheezing (with allergies, humidity). Negative for shortness of breath.   Cardiovascular: Positive for chest pain and palpitations. Negative for leg swelling.  Gastrointestinal: Negative for abdominal pain, blood in stool, constipation, diarrhea and nausea.  Genitourinary: Negative for difficulty urinating, dysuria and hematuria.  Musculoskeletal: Positive for arthralgias (arthritis).  Skin: Negative for color change and rash.  Neurological: Negative for dizziness, light-headedness and headaches.  Psychiatric/Behavioral: Negative for dysphoric mood. The patient is nervous/anxious (controlled).        Objective:   Vitals:   09/26/17 0918  BP: 140/88  Pulse: 87  Resp: 16  Temp: 98.7 F (37.1 C)  SpO2: 94%   Filed Weights   09/26/17 0918  Weight: 250 lb (113.4 kg)   Body mass index is 33.91 kg/m.  BP Readings from Last 3 Encounters:  09/26/17 140/88  07/26/17 138/82  11/19/16 118/78    Wt Readings from Last 3 Encounters:  09/26/17 250 lb (113.4 kg)  07/26/17 256 lb (116.1 kg)  11/19/16 253 lb (114.8 kg)     Physical Exam Constitutional: He appears well-developed and well-nourished. No distress.  HENT:  Head: Normocephalic and atraumatic.  Right Ear: External ear normal.  Left Ear: External ear normal.  Mouth/Throat: Oropharynx is clear and moist.  Normal ear canals and TM b/l  Eyes: Conjunctivae and EOM are  normal.  Neck: Neck supple. No tracheal deviation present. No thyromegaly present.  No carotid bruit  Cardiovascular: Normal rate, regular rhythm, normal heart sounds and intact distal pulses.   3/6 systolic murmur heard. Pulmonary/Chest: Effort normal and breath sounds normal. No respiratory distress. He has no wheezes. He has no rales.  Abdominal: Soft.  Umbilical hernia-reducible and nontender, no obvious inguinal hernia, he exhibits no distension. There is no  tenderness.  Genitourinary: deferred-we will do PSA Musculoskeletal: He exhibits no edema.  Lymphadenopathy:   He has no cervical adenopathy.  Skin: Skin is warm and dry. He is not diaphoretic.  Psychiatric: He has a normal mood and affect. His behavior is normal.         Assessment & Plan:   Physical exam: Screening blood work  ordered Immunizations   Discussed shingrix, others up to date Colonoscopy   Up to date  Eye exams   Up to date  EKG     Up to date  Exercise  Walking - not in the past three weeks, but will restart Weight   Obese - advised weight loss Skin no concerns - sees derm annually Substance abuse -    smoking, drinks daily - 4-6 beer/wine  See Problem List for Assessment and Plan of chronic medical problems.

## 2017-09-25 NOTE — Patient Instructions (Addendum)
Test(s) ordered today. Your results will be released to [facility] (or called to you) after review, usually within 72hours after test completion. If any changes need to be made, you will be notified at that same time.  All other Health Maintenance issues reviewed.   All recommended immunizations and age-appropriate screenings are up-to-date or discussed.  No immunizations administered today.   Medications reviewed and updated.  Changes include restarting advair daily.   Your prescription(s) have been submitted to your pharmacy. Please take as directed and contact our office if you believe you are having problem(s) with the medication(s).  A referral was ordered for nutrition  Please followup in one year    Health Maintenance, Male A healthy lifestyle and preventive care is important for your health and wellness. Ask your health care provider about what schedule of regular examinations is right for you. What should I know about weight and diet? Eat a Healthy Diet  Eat plenty of vegetables, fruits, whole grains, low-fat dairy products, and lean protein.  Do not eat a lot of foods high in solid fats, added sugars, or salt.  Maintain a Healthy Weight Regular exercise can help you achieve or maintain a healthy weight. You should:  Do at least 150 minutes of exercise each week. The exercise should increase your heart rate and make you sweat (moderate-intensity exercise).  Do strength-training exercises at least twice a week.  Watch Your Levels of Cholesterol and Blood Lipids  Have your blood tested for lipids and cholesterol every 5 years starting at 69 years of age. If you are at high risk for heart disease, you should start having your blood tested when you are 69 years old. You may need to have your cholesterol levels checked more often if: ? Your lipid or cholesterol levels are high. ? You are older than 69 years of age. ? You are at high risk for heart disease.  What should I  know about cancer screening? Many types of cancers can be detected early and may often be prevented. Lung Cancer  You should be screened every year for lung cancer if: ? You are a current smoker who has smoked for at least 30 years. ? You are a former smoker who has quit within the past 15 years.  Talk to your health care provider about your screening options, when you should start screening, and how often you should be screened.  Colorectal Cancer  Routine colorectal cancer screening usually begins at 69 years of age and should be repeated every 5-10 years until you are 69 years old. You may need to be screened more often if early forms of precancerous polyps or small growths are found. Your health care provider may recommend screening at an earlier age if you have risk factors for colon cancer.  Your health care provider may recommend using home test kits to check for hidden blood in the stool.  A small camera at the end of a tube can be used to examine your colon (sigmoidoscopy or colonoscopy). This checks for the earliest forms of colorectal cancer.  Prostate and Testicular Cancer  Depending on your age and overall health, your health care provider may do certain tests to screen for prostate and testicular cancer.  Talk to your health care provider about any symptoms or concerns you have about testicular or prostate cancer.  Skin Cancer  Check your skin from head to toe regularly.  Tell your health care provider about any new moles or changes in moles,  especially if: ? There is a change in a mole's size, shape, or color. ? You have a mole that is larger than a pencil eraser.  Always use sunscreen. Apply sunscreen liberally and repeat throughout the day.  Protect yourself by wearing long sleeves, pants, a wide-brimmed hat, and sunglasses when outside.  What should I know about heart disease, diabetes, and high blood pressure?  If you are 44-66 years of age, have your blood  pressure checked every 3-5 years. If you are 54 years of age or older, have your blood pressure checked every year. You should have your blood pressure measured twice-once when you are at a hospital or clinic, and once when you are not at a hospital or clinic. Record the average of the two measurements. To check your blood pressure when you are not at a hospital or clinic, you can use: ? An automated blood pressure machine at a pharmacy. ? A home blood pressure monitor.  Talk to your health care provider about your target blood pressure.  If you are between 74-47 years old, ask your health care provider if you should take aspirin to prevent heart disease.  Have regular diabetes screenings by checking your fasting blood sugar level. ? If you are at a normal weight and have a low risk for diabetes, have this test once every three years after the age of 65. ? If you are overweight and have a high risk for diabetes, consider being tested at a younger age or more often.  A one-time screening for abdominal aortic aneurysm (AAA) by ultrasound is recommended for men aged 52-75 years who are current or former smokers. What should I know about preventing infection? Hepatitis B If you have a higher risk for hepatitis B, you should be screened for this virus. Talk with your health care provider to find out if you are at risk for hepatitis B infection. Hepatitis C Blood testing is recommended for:  Everyone born from [birth year] through 1967.  Anyone with known risk factors for hepatitis C.  Sexually Transmitted Diseases (STDs)  You should be screened each year for STDs including gonorrhea and chlamydia if: ? You are sexually active and are younger than 69 years of age. ? You are older than 69 years of age and your health care provider tells you that you are at risk for this type of infection. ? Your sexual activity has changed since you were last screened and you are at an increased risk for chlamydia or  gonorrhea. Ask your health care provider if you are at risk.  Talk with your health care provider about whether you are at high risk of being infected with HIV. Your health care provider may recommend a prescription medicine to help prevent HIV infection.  What else can I do?  Schedule regular health, dental, and eye exams.  Stay current with your vaccines (immunizations).  Do not use any tobacco products, such as cigarettes, chewing tobacco, and e-cigarettes. If you need help quitting, ask your health care provider.  Limit alcohol intake to no more than 2 drinks per day. One drink equals 12 ounces of beer, 5 ounces of wine, or 1 ounces of hard liquor.  Do not use street drugs.  Do not share needles.  Ask your health care provider for help if you need support or information about quitting drugs.  Tell your health care provider if you often feel depressed.  Tell your health care provider if you have ever been abused or  do not feel safe at home. This information is not intended to replace advice given to you by your health care provider. Make sure you discuss any questions you have with your health care provider. Document Released: 11/06/2007 Document Revised: 01/07/2016 Document Reviewed: 02/11/2015 Elsevier Interactive Patient Education  [employer].

## 2017-09-26 ENCOUNTER — Ambulatory Visit (INDEPENDENT_AMBULATORY_CARE_PROVIDER_SITE_OTHER): Payer: Medicare Other | Admitting: Internal Medicine

## 2017-09-26 ENCOUNTER — Other Ambulatory Visit (INDEPENDENT_AMBULATORY_CARE_PROVIDER_SITE_OTHER): Payer: Medicare Other

## 2017-09-26 ENCOUNTER — Encounter: Payer: Self-pay | Admitting: Internal Medicine

## 2017-09-26 VITALS — BP 140/88 | HR 87 | Temp 98.7°F | Resp 16 | Ht 72.0 in | Wt 250.0 lb

## 2017-09-26 DIAGNOSIS — H6982 Other specified disorders of Eustachian tube, left ear: Secondary | ICD-10-CM | POA: Diagnosis not present

## 2017-09-26 DIAGNOSIS — E669 Obesity, unspecified: Secondary | ICD-10-CM | POA: Insufficient documentation

## 2017-09-26 DIAGNOSIS — E782 Mixed hyperlipidemia: Secondary | ICD-10-CM | POA: Diagnosis not present

## 2017-09-26 DIAGNOSIS — I10 Essential (primary) hypertension: Secondary | ICD-10-CM | POA: Diagnosis not present

## 2017-09-26 DIAGNOSIS — N529 Male erectile dysfunction, unspecified: Secondary | ICD-10-CM

## 2017-09-26 DIAGNOSIS — K429 Umbilical hernia without obstruction or gangrene: Secondary | ICD-10-CM

## 2017-09-26 DIAGNOSIS — Z0001 Encounter for general adult medical examination with abnormal findings: Secondary | ICD-10-CM | POA: Diagnosis not present

## 2017-09-26 DIAGNOSIS — I350 Nonrheumatic aortic (valve) stenosis: Secondary | ICD-10-CM | POA: Diagnosis not present

## 2017-09-26 DIAGNOSIS — K439 Ventral hernia without obstruction or gangrene: Secondary | ICD-10-CM | POA: Insufficient documentation

## 2017-09-26 DIAGNOSIS — I2510 Atherosclerotic heart disease of native coronary artery without angina pectoris: Secondary | ICD-10-CM

## 2017-09-26 DIAGNOSIS — R7303 Prediabetes: Secondary | ICD-10-CM | POA: Diagnosis not present

## 2017-09-26 DIAGNOSIS — K420 Umbilical hernia with obstruction, without gangrene: Secondary | ICD-10-CM | POA: Insufficient documentation

## 2017-09-26 DIAGNOSIS — F411 Generalized anxiety disorder: Secondary | ICD-10-CM | POA: Diagnosis not present

## 2017-09-26 DIAGNOSIS — K4090 Unilateral inguinal hernia, without obstruction or gangrene, not specified as recurrent: Secondary | ICD-10-CM | POA: Insufficient documentation

## 2017-09-26 DIAGNOSIS — J301 Allergic rhinitis due to pollen: Secondary | ICD-10-CM

## 2017-09-26 LAB — CBC WITH DIFFERENTIAL/PLATELET
Basophils Absolute: 0.1 10*3/uL (ref 0.0–0.1)
Basophils Relative: 0.7 % (ref 0.0–3.0)
EOS PCT: 4.7 % (ref 0.0–5.0)
Eosinophils Absolute: 0.4 10*3/uL (ref 0.0–0.7)
HEMATOCRIT: 46.6 % (ref 39.0–52.0)
Hemoglobin: 16.3 g/dL (ref 13.0–17.0)
LYMPHS ABS: 1.1 10*3/uL (ref 0.7–4.0)
LYMPHS PCT: 13.4 % (ref 12.0–46.0)
MCHC: 35.1 g/dL (ref 30.0–36.0)
MCV: 97.4 fl (ref 78.0–100.0)
Monocytes Absolute: 0.7 10*3/uL (ref 0.1–1.0)
Monocytes Relative: 8.7 % (ref 3.0–12.0)
NEUTROS ABS: 6.2 10*3/uL (ref 1.4–7.7)
NEUTROS PCT: 72.5 % (ref 43.0–77.0)
PLATELETS: 224 10*3/uL (ref 150.0–400.0)
RBC: 4.78 Mil/uL (ref 4.22–5.81)
RDW: 13.3 % (ref 11.5–15.5)
WBC: 8.6 10*3/uL (ref 4.0–10.5)

## 2017-09-26 LAB — URINALYSIS, ROUTINE W REFLEX MICROSCOPIC
Bilirubin Urine: NEGATIVE
Hgb urine dipstick: NEGATIVE
KETONES UR: NEGATIVE
Leukocytes, UA: NEGATIVE
Nitrite: NEGATIVE
PH: 6 (ref 5.0–8.0)
RBC / HPF: NONE SEEN (ref 0–?)
SPECIFIC GRAVITY, URINE: 1.015 (ref 1.000–1.030)
TOTAL PROTEIN, URINE-UPE24: NEGATIVE
URINE GLUCOSE: NEGATIVE
UROBILINOGEN UA: 0.2 (ref 0.0–1.0)

## 2017-09-26 LAB — COMPREHENSIVE METABOLIC PANEL
ALT: 24 U/L (ref 0–53)
AST: 23 U/L (ref 0–37)
Albumin: 4.5 g/dL (ref 3.5–5.2)
Alkaline Phosphatase: 81 U/L (ref 39–117)
BUN: 11 mg/dL (ref 6–23)
CALCIUM: 9.3 mg/dL (ref 8.4–10.5)
CO2: 25 meq/L (ref 19–32)
Chloride: 105 mEq/L (ref 96–112)
Creatinine, Ser: 0.82 mg/dL (ref 0.40–1.50)
GFR: 99.1 mL/min (ref >60.00)
GLUCOSE: 114 mg/dL — AB (ref 70–99)
POTASSIUM: 4 meq/L (ref 3.5–5.1)
Sodium: 140 mEq/L (ref 135–145)
Total Bilirubin: 0.7 mg/dL (ref 0.2–1.2)
Total Protein: 6.9 g/dL (ref 6.0–8.3)

## 2017-09-26 LAB — LIPID PANEL
CHOL/HDL RATIO: 3
Cholesterol: 160 mg/dL (ref 0–200)
HDL: 53.4 mg/dL (ref >39.00)
LDL Cholesterol: 91 mg/dL (ref 0–99)
NonHDL: 106.67
TRIGLYCERIDES: 76 mg/dL (ref 0.0–149.0)
VLDL: 15.2 mg/dL (ref 0.0–40.0)

## 2017-09-26 LAB — PSA, MEDICARE: PSA: 0.35 ng/ml (ref 0.10–4.00)

## 2017-09-26 LAB — TSH: TSH: 1.38 u[IU]/mL (ref 0.35–4.50)

## 2017-09-26 LAB — HEMOGLOBIN A1C: Hgb A1c MFr Bld: 5.8 % (ref 4.6–6.5)

## 2017-09-26 MED ORDER — FLUTICASONE-SALMETEROL 250-50 MCG/DOSE IN AEPB: 1.0000 | INHALATION_SPRAY | Freq: Two times a day (BID) | RESPIRATORY_TRACT | 11 refills | Status: DC

## 2017-09-26 NOTE — Assessment & Plan Note (Signed)
No obvious hernia on exam, but clinically this sounds like a hernia Mildly symptomatic He will monitor for now and if symptoms increase he will let me know and I will refer to surgery

## 2017-09-26 NOTE — Assessment & Plan Note (Signed)
Following with cardiology No symptoms Encouraged increasing his activity and working on weight loss

## 2017-09-26 NOTE — Assessment & Plan Note (Signed)
Check a1c Low sugar / carb diet Stressed regular exercise, weight loss  

## 2017-09-26 NOTE — Assessment & Plan Note (Signed)
Encourage weight loss Revise eating-he is interested in seeing a nutritionist-referred Increase activity

## 2017-09-26 NOTE — Assessment & Plan Note (Signed)
Associated with some cough and wheezing Using albuterol as needed, but using daily Has been on Advair in the past and was worked well-we will restart-may only need during allergy season Restart allergy medication Albuterol only as needed-currently using 3 times a day

## 2017-09-26 NOTE — Assessment & Plan Note (Signed)
Following with cardiology Has occasional chest pain, which he has with cardiology Encouraged weight loss, increased physical activity Continue current medications Check CMP, CBC, TSH, lipid panel

## 2017-09-26 NOTE — Assessment & Plan Note (Signed)
Takes Viagra as needed

## 2017-09-26 NOTE — Assessment & Plan Note (Signed)
Blood pressure borderline high, but has been better controlled than last 6 months We will start working more on weight loss Refer to nutrition Increase activity, decrease portions CMP

## 2017-09-26 NOTE — Assessment & Plan Note (Signed)
Intermittent, mild discomfort in left side of neck-relieved with popping his ear Related to allergies Flonase-use saline spray to prevent nosebleeds

## 2017-09-26 NOTE — Assessment & Plan Note (Signed)
Controlled, stable Continue current dose of medication  

## 2017-09-26 NOTE — Assessment & Plan Note (Addendum)
Nontender, reducible Encouraged weight loss Asymptomatic Monitor

## 2017-09-26 NOTE — Assessment & Plan Note (Signed)
Check lipid panel  Continue daily statin Regular exercise and healthy diet encouraged  

## 2017-09-29 ENCOUNTER — Encounter: Payer: Self-pay | Admitting: Internal Medicine

## 2017-10-13 DIAGNOSIS — L814 Other melanin hyperpigmentation: Secondary | ICD-10-CM | POA: Diagnosis not present

## 2017-10-13 DIAGNOSIS — D1801 Hemangioma of skin and subcutaneous tissue: Secondary | ICD-10-CM | POA: Diagnosis not present

## 2017-10-13 DIAGNOSIS — L821 Other seborrheic keratosis: Secondary | ICD-10-CM | POA: Diagnosis not present

## 2017-10-13 DIAGNOSIS — L608 Other nail disorders: Secondary | ICD-10-CM | POA: Diagnosis not present

## 2017-10-19 ENCOUNTER — Encounter: Payer: Self-pay | Admitting: Cardiology

## 2017-10-19 DIAGNOSIS — R002 Palpitations: Secondary | ICD-10-CM

## 2017-10-19 DIAGNOSIS — I350 Nonrheumatic aortic (valve) stenosis: Secondary | ICD-10-CM

## 2017-10-19 NOTE — Telephone Encounter (Addendum)
Patient returned call - stated that light headed & irregular heart rate been going on x 7-10 days.  Notices pounding in chest occasionally.  No c/o chest pain or SOB.  No c/o exertional dyspnea either.  Stated that he has been working outside a lot & sweating so that may have something to do with it as well.  Stated that he has had history of PVC's in the past.  BP at dermatologist office was 120/65 last week.  Stated he has device at home that he has checked heart rate on & has been reporting normal rhythm.  Will fwd message to provider for suggestions & if he feels patient needs to have echo done prior to the September time frame.

## 2017-10-19 NOTE — Telephone Encounter (Signed)
Reviewed nursing note.  If he feels like the palpitations are continuing, we could place a cardiac monitor for 48 hours to make sure he is not having any arrhythmias other than just occasional PVCs.  Last echocardiogram was in July 2018, would plan to go ahead and get a follow-up study in the next month.

## 2017-10-20 ENCOUNTER — Telehealth: Payer: Self-pay | Admitting: Cardiology

## 2017-10-20 NOTE — Telephone Encounter (Signed)
Patient notified & agrees to Holter monitor & Echo.  48 hour Holter scheduled for Monday, 10/24/2017 at 10:00 am here in [place] office.  Order will be entered for Echo & fwd to [facility] for scheduling.

## 2017-10-20 NOTE — Telephone Encounter (Signed)
Pre-cert Verification for the following procedure   °Echo scheduled for 11/10/2017  °

## 2017-10-20 NOTE — Addendum Note (Signed)
Addended by: Laurine Blazer on: 10/20/2017 11:34 AM   Modules accepted: Orders

## 2017-10-21 ENCOUNTER — Other Ambulatory Visit: Payer: Self-pay | Admitting: Internal Medicine

## 2017-10-24 DIAGNOSIS — R002 Palpitations: Secondary | ICD-10-CM | POA: Diagnosis not present

## 2017-11-03 ENCOUNTER — Other Ambulatory Visit: Payer: Self-pay | Admitting: Internal Medicine

## 2017-11-04 ENCOUNTER — Other Ambulatory Visit: Payer: Self-pay | Admitting: Internal Medicine

## 2017-11-08 ENCOUNTER — Other Ambulatory Visit: Payer: Self-pay | Admitting: Internal Medicine

## 2017-11-10 ENCOUNTER — Other Ambulatory Visit: Payer: Self-pay

## 2017-11-10 ENCOUNTER — Ambulatory Visit (INDEPENDENT_AMBULATORY_CARE_PROVIDER_SITE_OTHER): Payer: Medicare Other

## 2017-11-10 ENCOUNTER — Telehealth: Payer: Self-pay | Admitting: *Deleted

## 2017-11-10 DIAGNOSIS — I350 Nonrheumatic aortic (valve) stenosis: Secondary | ICD-10-CM | POA: Diagnosis not present

## 2017-11-10 NOTE — Telephone Encounter (Signed)
Per Dr. Myles Gip review - Sinus rhythm present throughout.  HR range 48 bpm to 121 bpm with average 76 bmp.  Occasional PVC's (3.4% total time) with rare couplet and brief bigeminy.  Rare PAC's.  No pauses or sustained arrhythmia's.  (SEE REPORT IN Epic)  Patient notified and verbalized understanding.   His Echo is scheduled for today.

## 2017-11-15 ENCOUNTER — Telehealth: Payer: Self-pay

## 2017-11-15 ENCOUNTER — Other Ambulatory Visit: Payer: Self-pay | Admitting: Internal Medicine

## 2017-11-15 NOTE — Telephone Encounter (Signed)
Patient notified. Routed to PCP. Appointment scheduled.

## 2017-11-15 NOTE — Telephone Encounter (Signed)
-----   Message from Erma Heritage, [place] sent at 11/13/2017  8:31 AM EDT ----- Covering for Dr. Domenic Polite - Please let the patient know his echocardiogram shows normal pumping function of the heart with an EF of 60-65% with no wall motion abnormalities. His aortic stenosis has progressed since his last echocardiogram. Was previously moderate, now in a moderate to severe category. Please make sure he has scheduled follow-up with Dr. Domenic Polite within the next 4-6 weeks to further discuss. Forward a copy to Binnie Rail, MD.

## 2017-11-22 ENCOUNTER — Other Ambulatory Visit: Payer: Self-pay

## 2017-11-22 DIAGNOSIS — R002 Palpitations: Secondary | ICD-10-CM

## 2017-11-29 ENCOUNTER — Other Ambulatory Visit: Payer: Self-pay | Admitting: Internal Medicine

## 2017-12-05 DIAGNOSIS — Z85820 Personal history of malignant melanoma of skin: Secondary | ICD-10-CM | POA: Diagnosis not present

## 2017-12-05 DIAGNOSIS — Z808 Family history of malignant neoplasm of other organs or systems: Secondary | ICD-10-CM | POA: Diagnosis not present

## 2017-12-05 DIAGNOSIS — D692 Other nonthrombocytopenic purpura: Secondary | ICD-10-CM | POA: Diagnosis not present

## 2017-12-20 ENCOUNTER — Other Ambulatory Visit: Payer: Self-pay | Admitting: Internal Medicine

## 2017-12-22 HISTORY — PX: HERNIA REPAIR: SHX51

## 2018-01-04 NOTE — Progress Notes (Signed)
Cardiology Office Note  Date: 01/05/2018   ID: Danny Johnson, DOB [birth year], MRN [id number]  PCP: Binnie Rail, MD  Primary Cardiologist: Rozann Lesches, MD   Chief Complaint  Patient presents with  . Aortic Stenosis    History of Present Illness: Danny Johnson is a 67 y.o. male last seen in March.  He presents for a follow-up visit.  Since last assessment he does not report any definitive change in stamina.  Still works part-time at a part 3 [employer] course in [place].  He has been walking for exercise, recently up to 3 miles.  He feels like if he stays hydrated he feels better overall.  At this time he is not bothered by progressive palpitations, has had no dizziness or syncope.  Interval Holter monitor in June revealed sinus rhythm with occasional PVCs, 3.4% of total beats, rare ventricular couplets and brief bigeminy.  Also rare PACs.  We discussed these results today.  Follow-up echocardiogram in June revealed moderate LVH with LVEF 60 to 65%, severely calcified aortic annulus with moderate to severe stenosis and mild aortic regurgitation.  Mean gradient 33 mmHg.  Past Medical History:  Diagnosis Date  . Anxiety   . Aortic stenosis    Mild to moderate  . Arthritis   . Asthma   . Bicuspid aortic valve   . Coronary atherosclerosis of native coronary artery    Minimal, LVEF 60%  . Essential hypertension, benign   . Melanoma ([place]) 1993  . Mixed hyperlipidemia   . PVC's (premature ventricular contractions)     Past Surgical History:  Procedure Laterality Date  . TONSILLECTOMY      Current Outpatient Medications  Medication Sig Dispense Refill  . albuterol (PROAIR HFA) 108 (90 Base) MCG/ACT inhaler Inhale 2 puffs into the lungs every 6 (six) hours as needed for wheezing or shortness of breath. 3 Inhaler 2  . aspirin 81 MG tablet Take 81 mg by mouth daily.      Marland Kitchen atorvastatin (LIPITOR) 10 MG tablet TAKE 1 TABLET BY MOUTH DAILY GENERIC EQUIVALENT FOR LIPITOR 90  tablet 2  . Fluticasone-Salmeterol (ADVAIR DISKUS) 250-50 MCG/DOSE AEPB Inhale 1 puff into the lungs 2 (two) times daily. 1 each 11  . quinapril (ACCUPRIL) 20 MG tablet TAKE 1 TABLET BY MOUTH DAILY 90 tablet 1  . sildenafil (REVATIO) 20 MG tablet TAKE 3 TO 5 TABLETS DAILY AS NEEDED 30 tablet 0  . venlafaxine XR (EFFEXOR-XR) 37.5 MG 24 hr capsule TAKE 1 CAPSULE BY MOUTH ONCE DAILY WITH  BREAKFAST 90 capsule 1  . VENTOLIN HFA 108 (90 Base) MCG/ACT inhaler INHALE 2 PUFFS BY MOUTH INTO THE LUNGS EVERY 6 HOURS AS NEEDED FOR WHEEZING OR SHORTNESS OF BREATH 54 g 5   No current facility-administered medications for this visit.    Allergies:  Patient has no known allergies.   Social History: The patient  reports that he has been smoking cigarettes. He started smoking about 16 years ago. He has a 10.00 pack-year smoking history. He has never used smokeless tobacco. He reports that he drinks about 42.0 standard drinks of alcohol per week. He reports that he does not use drugs.   ROS:  Please see the history of present illness. Otherwise, complete review of systems is positive for occasional palpitations.  All other systems are reviewed and negative.   Physical Exam: VS:  BP 138/60   Pulse 61   Ht 6\' 1"  (1.854 m)   Wt 248 lb (112.5  kg)   SpO2 98%   BMI 32.72 kg/m , BMI Body mass index is 32.72 kg/m.  Wt Readings from Last 3 Encounters:  01/05/18 248 lb (112.5 kg)  09/26/17 250 lb (113.4 kg)  07/26/17 256 lb (116.1 kg)    General: Patient appears comfortable at rest. HEENT: Conjunctiva and lids normal, oropharynx clear. Neck: Supple, no elevated JVP or carotid bruits, no thyromegaly. Lungs: Clear to auscultation, nonlabored breathing at rest. Cardiac: Regular rate and rhythm, no S3, 3/6 systolic murmur. Abdomen: Soft, nontender, bowel sounds present. Extremities: No pitting edema, distal pulses 2+. Skin: Warm and dry. Musculoskeletal: No kyphosis. Neuropsychiatric: Alert and oriented x3,  affect grossly appropriate.  ECG: I personally reviewed the tracing from 07/26/2017 which showed normal sinus rhythm.  Recent Labwork: 09/26/2017: ALT 24; AST 23; BUN 11; Creatinine, Ser 0.82; Hemoglobin 16.3; Platelets 224.0; Potassium 4.0; Sodium 140; TSH 1.38     Component Value Date/Time   CHOL 160 09/26/2017 1026   TRIG 76.0 09/26/2017 1026   TRIG 89 10/25/2008   HDL 53.40 09/26/2017 1026   CHOLHDL 3 09/26/2017 1026   VLDL 15.2 09/26/2017 1026   LDLCALC 91 09/26/2017 1026   LDLDIRECT 153.1 06/29/2012 0929    Other Studies Reviewed Today:  Echocardiogram 11/10/2017: Study Conclusions  - Left ventricle: The cavity size was normal. Wall thickness was   increased in a pattern of moderate LVH. Systolic function was   normal. The estimated ejection fraction was in the range of 60%   to 65%. Wall motion was normal; there were no regional wall   motion abnormalities. Left ventricular diastolic function   parameters were normal. - Aortic valve: Severely calcified annulus. Trileaflet; severely   thickened leaflets. Difficult Doppler assessment of aortic valve,   angle of acquisition may be affected. There was moderate to   severe stenosis. There was mild regurgitation. Mean gradient (S):   33 mm Hg. Valve area (VTI): 0.85 cm^2. Valve area (Vmax): 0.84   cm^2. Valve area (Vmean): 0.83 cm^2. - Atrial septum: No defect or patent foramen ovale was identified.  Assessment and Plan:  1.  Bicuspid aortic valve with moderate to severe aortic stenosis, most recent mean gradient 33 mmHg.  We have discussed the results, he does not report any definitive change in stamina or new exertional symptoms.  2.  Essential hypertension, on ACE inhibitor, blood pressure adequately controlled today.  3.  Hyperlipidemia on Lipitor with follow-up per PCP.  Last LDL 91.  4.  Tobacco abuse in remission.  5.  History of palpitations, PVCs documented as well as PACs.  Current medicines were reviewed with  the patient today.   Orders Placed This Encounter  Procedures  . ECHOCARDIOGRAM COMPLETE    Disposition: Follow-up in 6 months.  Signed, Satira Sark, MD, [facility] 01/05/2018 12:04 PM    [facility] [address] [place], [place] [address] Phone: [phone]; Fax: [phone]

## 2018-01-05 ENCOUNTER — Encounter: Payer: Self-pay | Admitting: Cardiology

## 2018-01-05 ENCOUNTER — Ambulatory Visit: Payer: Medicare Other | Admitting: Cardiology

## 2018-01-05 VITALS — BP 138/60 | HR 61 | Ht 73.0 in | Wt 248.0 lb

## 2018-01-05 DIAGNOSIS — I10 Essential (primary) hypertension: Secondary | ICD-10-CM | POA: Diagnosis not present

## 2018-01-05 DIAGNOSIS — R002 Palpitations: Secondary | ICD-10-CM

## 2018-01-05 DIAGNOSIS — E782 Mixed hyperlipidemia: Secondary | ICD-10-CM | POA: Diagnosis not present

## 2018-01-05 DIAGNOSIS — I350 Nonrheumatic aortic (valve) stenosis: Secondary | ICD-10-CM | POA: Diagnosis not present

## 2018-01-05 NOTE — Patient Instructions (Signed)
Your physician wants you to follow-up in: 6 months with Dr.McDowell You will receive a reminder letter in the mail two months in advance. If you don't receive a letter, please call our office to schedule the follow-up appointment.  Your physician recommends that you continue on your current medications as directed. Please refer to the Current Medication list given to you today.    If you need a refill on your cardiac medications before your next appointment, please call your pharmacy.      Your physician has requested that you have an echocardiogram in 6 months JUST BEFORE NEXT VISIT. Echocardiography is a painless test that uses sound waves to create images of your heart. It provides your doctor with information about the size and shape of your heart and how well your heart's chambers and valves are working. This procedure takes approximately one hour. There are no restrictions for this procedure.

## 2018-01-16 ENCOUNTER — Encounter: Payer: Self-pay | Admitting: Internal Medicine

## 2018-01-16 DIAGNOSIS — K429 Umbilical hernia without obstruction or gangrene: Secondary | ICD-10-CM

## 2018-01-17 NOTE — Progress Notes (Signed)
Subjective:    Patient ID: Danny Johnson, male    DOB: 07-09-1950, 67 y.o.   MRN: [id number]  HPI The patient is here for an acute visit.  Umbilical hernia, red and painful:  He has had an umbilical hernia for year. It has  always been soft.  Recently the hernia is hard.  It is red and warm.  It is tender if he pushes on it or if he turns in a certain way.  A few days ago the redness was much larger - it has decreased, but still there.  He had radiating pain to the left side of his abdomen from the hernia once, but that has not recurred.  The other night he had brief subjective chills, but has not had any since then.  He denies fevers and overall does not feel bad.      He denies constipation or changes in bowels.     Medications and allergies reviewed with patient and updated if appropriate.  Patient Active Problem List   Diagnosis Date Noted  . Obesity (BMI 30.0-34.9) 09/26/2017  . Umbilical hernia with obstruction but no gangrene 09/26/2017  . Right inguinal hernia 09/26/2017  . Dysfunction of left eustachian tube 09/26/2017  . Prediabetes 10/07/2015  . Coronary atherosclerosis of native coronary artery 07/09/2011  . ERECTILE DYSFUNCTION, ORGANIC 11/13/2009  . Allergic rhinitis 06/19/2009  . Aortic stenosis 11/05/2008  . Hyperlipidemia 03/18/2008  . Anxiety state 03/18/2008  . Essential hypertension 03/18/2008  . PVC's (premature ventricular contractions) 03/18/2008    Current Outpatient Medications on File Prior to Visit  Medication Sig Dispense Refill  . albuterol (PROAIR HFA) 108 (90 Base) MCG/ACT inhaler Inhale 2 puffs into the lungs every 6 (six) hours as needed for wheezing or shortness of breath. 3 Inhaler 2  . aspirin 81 MG tablet Take 81 mg by mouth daily.      Marland Kitchen atorvastatin (LIPITOR) 10 MG tablet TAKE 1 TABLET BY MOUTH DAILY GENERIC EQUIVALENT FOR LIPITOR 90 tablet 2  . Fluticasone-Salmeterol (ADVAIR DISKUS) 250-50 MCG/DOSE AEPB Inhale 1 puff into the lungs 2  (two) times daily. 1 each 11  . quinapril (ACCUPRIL) 20 MG tablet TAKE 1 TABLET BY MOUTH DAILY 90 tablet 1  . sildenafil (REVATIO) 20 MG tablet TAKE 3 TO 5 TABLETS DAILY AS NEEDED 30 tablet 0  . venlafaxine XR (EFFEXOR-XR) 37.5 MG 24 hr capsule TAKE 1 CAPSULE BY MOUTH ONCE DAILY WITH  BREAKFAST 90 capsule 1  . VENTOLIN HFA 108 (90 Base) MCG/ACT inhaler INHALE 2 PUFFS BY MOUTH INTO THE LUNGS EVERY 6 HOURS AS NEEDED FOR WHEEZING OR SHORTNESS OF BREATH 54 g 5   No current facility-administered medications on file prior to visit.     Past Medical History:  Diagnosis Date  . Anxiety   . Aortic stenosis    Mild to moderate  . Arthritis   . Asthma   . Bicuspid aortic valve   . Coronary atherosclerosis of native coronary artery    Minimal, LVEF 60%  . Essential hypertension, benign   . Melanoma ([place]) 1993  . Mixed hyperlipidemia   . PVC's (premature ventricular contractions)     Past Surgical History:  Procedure Laterality Date  . TONSILLECTOMY      Social History   Socioeconomic History  . Marital status: Divorced    Spouse name: Not on file  . Number of children: Not on file  . Years of education: Not on file  . Highest education level:  Not on file  Occupational History  . Not on file  Social Needs  . Financial resource strain: Not on file  . Food insecurity:    Worry: Not on file    Inability: Not on file  . Transportation needs:    Medical: Not on file    Non-medical: Not on file  Tobacco Use  . Smoking status: Current Some Day Smoker    Packs/day: 1.00    Years: 10.00    Pack years: 10.00    Types: Cigarettes    Start date: 07/21/2001  . Smokeless tobacco: Never Used  Substance and Sexual Activity  . Alcohol use: Yes    Alcohol/week: 42.0 standard drinks    Types: 42 Standard drinks or equivalent per week    Comment: 4-6 drinks daily, beer/wine  . Drug use: No  . Sexual activity: Not on file  Lifestyle  . Physical activity:    Days per week: Not on file     Minutes per session: Not on file  . Stress: Not on file  Relationships  . Social connections:    Talks on phone: Not on file    Gets together: Not on file    Attends religious service: Not on file    Active member of club or organization: Not on file    Attends meetings of clubs or organizations: Not on file    Relationship status: Not on file  Other Topics Concern  . Not on file  Social History Narrative  . Not on file    Family History  Problem Relation Age of Onset  . Dementia Mother     Review of Systems Per HPI    Objective:   Vitals:   01/18/18 0859  BP: 128/84  Pulse: 75  Resp: 16  Temp: 98.4 F (36.9 C)  SpO2: 97%   BP Readings from Last 3 Encounters:  01/18/18 128/84  01/05/18 138/60  09/26/17 140/88   Wt Readings from Last 3 Encounters:  01/18/18 248 lb 12.8 oz (112.9 kg)  01/05/18 248 lb (112.5 kg)  09/26/17 250 lb (113.4 kg)   Body mass index is 32.83 kg/m.   Physical Exam  Constitutional: He appears well-developed and well-nourished. No distress.  HENT:  Head: Normocephalic and atraumatic.  Abdominal: A hernia (umbilical hernia that is hard and red, it is warm to touch and tender, the hernia is not reducible.  no gangrene) is present.  Skin: He is not diaphoretic.           Assessment & Plan:    See Problem List for Assessment and Plan of chronic medical problems.

## 2018-01-18 ENCOUNTER — Encounter: Payer: Self-pay | Admitting: Internal Medicine

## 2018-01-18 ENCOUNTER — Ambulatory Visit: Payer: Medicare Other | Admitting: Internal Medicine

## 2018-01-18 VITALS — BP 128/84 | HR 75 | Temp 98.4°F | Resp 16 | Ht 73.0 in | Wt 248.8 lb

## 2018-01-18 DIAGNOSIS — K5730 Diverticulosis of large intestine without perforation or abscess without bleeding: Secondary | ICD-10-CM | POA: Diagnosis not present

## 2018-01-18 DIAGNOSIS — I10 Essential (primary) hypertension: Secondary | ICD-10-CM | POA: Diagnosis not present

## 2018-01-18 DIAGNOSIS — K429 Umbilical hernia without obstruction or gangrene: Secondary | ICD-10-CM | POA: Diagnosis not present

## 2018-01-18 DIAGNOSIS — R1030 Lower abdominal pain, unspecified: Secondary | ICD-10-CM | POA: Diagnosis not present

## 2018-01-18 DIAGNOSIS — J45990 Exercise induced bronchospasm: Secondary | ICD-10-CM | POA: Diagnosis not present

## 2018-01-18 DIAGNOSIS — K420 Umbilical hernia with obstruction, without gangrene: Secondary | ICD-10-CM | POA: Diagnosis not present

## 2018-01-18 DIAGNOSIS — K7689 Other specified diseases of liver: Secondary | ICD-10-CM | POA: Diagnosis not present

## 2018-01-18 DIAGNOSIS — F17210 Nicotine dependence, cigarettes, uncomplicated: Secondary | ICD-10-CM | POA: Diagnosis not present

## 2018-01-18 DIAGNOSIS — Z79899 Other long term (current) drug therapy: Secondary | ICD-10-CM | POA: Diagnosis not present

## 2018-01-18 NOTE — Patient Instructions (Signed)
You should hear from surgery today or tomorrow.  If your symptoms worsen please call.

## 2018-01-18 NOTE — Assessment & Plan Note (Signed)
Umbilical hernia, symptomatic, not reducible, tender, warm and red No gangrene Needs surgery asap Referred to surgery yesterday but will change it to an urgent referral

## 2018-01-19 DIAGNOSIS — K420 Umbilical hernia with obstruction, without gangrene: Secondary | ICD-10-CM | POA: Diagnosis not present

## 2018-01-20 ENCOUNTER — Other Ambulatory Visit (HOSPITAL_COMMUNITY): Payer: Medicare Other

## 2018-01-21 MED ORDER — SILDENAFIL CITRATE 20 MG PO TABS
20.00 | ORAL_TABLET | ORAL | Status: DC
Start: 2018-01-19 — End: 2018-01-21

## 2018-01-21 MED ORDER — HYDROCODONE-ACETAMINOPHEN 5-325 MG PO TABS
1.00 | ORAL_TABLET | ORAL | Status: DC
Start: ? — End: 2018-01-21

## 2018-01-21 MED ORDER — ASPIRIN EC 81 MG PO TBEC
81.00 | DELAYED_RELEASE_TABLET | ORAL | Status: DC
Start: 2018-01-20 — End: 2018-01-21

## 2018-01-21 MED ORDER — VENLAFAXINE HCL ER 37.5 MG PO CP24
37.50 | ORAL_CAPSULE | ORAL | Status: DC
Start: 2018-01-20 — End: 2018-01-21

## 2018-01-21 MED ORDER — ALBUTEROL SULFATE (2.5 MG/3ML) 0.083% IN NEBU
2.50 | INHALATION_SOLUTION | RESPIRATORY_TRACT | Status: DC
Start: ? — End: 2018-01-21

## 2018-01-21 MED ORDER — LACTATED RINGERS IV SOLN
100.00 | INTRAVENOUS | Status: DC
Start: ? — End: 2018-01-21

## 2018-01-21 MED ORDER — ENOXAPARIN SODIUM 40 MG/0.4ML ~~LOC~~ SOLN
40.00 | SUBCUTANEOUS | Status: DC
Start: ? — End: 2018-01-21

## 2018-01-21 MED ORDER — MORPHINE SULFATE (PF) 10 MG/ML IV SOLN
2.00 | INTRAVENOUS | Status: DC
Start: ? — End: 2018-01-21

## 2018-01-21 MED ORDER — CEFAZOLIN IN SODIUM CHLORIDE 2-0.9 GM/100ML-% IV SOLN
2.00 | INTRAVENOUS | Status: DC
Start: 2018-01-19 — End: 2018-01-21

## 2018-01-21 MED ORDER — GENERIC EXTERNAL MEDICATION
4.00 | Status: DC
Start: ? — End: 2018-01-21

## 2018-01-21 MED ORDER — LISINOPRIL 20 MG PO TABS
20.00 | ORAL_TABLET | ORAL | Status: DC
Start: 2018-01-20 — End: 2018-01-21

## 2018-01-21 MED ORDER — GENERIC EXTERNAL MEDICATION
650.00 | Status: DC
Start: ? — End: 2018-01-21

## 2018-01-21 MED ORDER — PRAVASTATIN SODIUM 40 MG PO TABS
40.00 | ORAL_TABLET | ORAL | Status: DC
Start: ? — End: 2018-01-21

## 2018-02-16 DIAGNOSIS — H524 Presbyopia: Secondary | ICD-10-CM | POA: Diagnosis not present

## 2018-03-28 ENCOUNTER — Other Ambulatory Visit: Payer: Self-pay | Admitting: Internal Medicine

## 2018-04-16 ENCOUNTER — Other Ambulatory Visit: Payer: Self-pay | Admitting: Internal Medicine

## 2018-05-09 NOTE — Telephone Encounter (Signed)
The main options for treating progressive valvular heart disease such as symptomatic, severe aortic stenosis are either open heart surgery with valve replacement (a mechanical or bioprosthetic valve) versus a catheter-based procedure called TAVR which is in most cases reserved for patients that are not good surgical candidates.  When we get to the point that surgical consultation is needed, all of this will be explained in great detail.  As far as the apparent atrial fibrillation on Saturday, we do not have any prior documented history of atrial fibrillation, and this should be investigated further.  Please find out if this was the only episode similar to this, or whether it has been happening more regularly.  I would like to place a formal outpatient cardiac monitor but need to know for what duration.

## 2018-05-15 ENCOUNTER — Telehealth: Payer: Self-pay

## 2018-05-15 NOTE — Telephone Encounter (Signed)
I spoke with patient. He had a single episode of racing heat which last for approx 90 minutes. He says his Smart watch told him his heart was 160. He uses like caffeine, but, drinks his fair share of scotch.

## 2018-05-15 NOTE — Telephone Encounter (Signed)
Consider a 30-day event monitor.

## 2018-05-15 NOTE — Telephone Encounter (Signed)
I spoke with patient in response to e-mail regarding episode of fast heart rate which happened one time and lasted over 90 minutes.He states his Smart phone had HR at 160 bmp.  He uses little alcohol but does drink his "fair share" of scotch.  I will forward to Dr.McDowell to determine which type of event monitor patient needs

## 2018-05-15 NOTE — Telephone Encounter (Signed)
The main options for treating progressive valvular heart disease such as symptomatic, severe aortic stenosis are either open heart surgery with valve replacement (a mechanical or bioprosthetic valve) versus a catheter-based procedure called TAVR which is in most cases reserved for patients that are not good surgical candidates.  When we get to the point that surgical consultation is needed, all of this will be explained in great detail.  As far as the apparent atrial fibrillation on Saturday, we do not have any prior documented history of atrial fibrillation, and this should be investigated further.  Please find out if this was the only episode similar to this, or whether it has been happening more regularly.  I would like to place a formal outpatient cardiac monitor but need to know for what duration.

## 2018-05-26 ENCOUNTER — Encounter: Payer: Self-pay | Admitting: Internal Medicine

## 2018-05-29 ENCOUNTER — Ambulatory Visit (HOSPITAL_COMMUNITY)
Admission: RE | Admit: 2018-05-29 | Discharge: 2018-05-29 | Disposition: A | Discharge status: Home / Self Care | Payer: Medicare Other | Source: Ambulatory Visit | Attending: Cardiology | Admitting: Cardiology

## 2018-05-29 DIAGNOSIS — I350 Nonrheumatic aortic (valve) stenosis: Secondary | ICD-10-CM

## 2018-05-29 NOTE — Progress Notes (Signed)
*  PRELIMINARY RESULTS* Echocardiogram 2D Echocardiogram has been performed.  Danny Johnson 05/29/2018, 12:25 PM

## 2018-05-30 ENCOUNTER — Other Ambulatory Visit: Payer: Self-pay

## 2018-05-30 ENCOUNTER — Ambulatory Visit (INDEPENDENT_AMBULATORY_CARE_PROVIDER_SITE_OTHER): Payer: Medicare Other

## 2018-05-30 DIAGNOSIS — R000 Tachycardia, unspecified: Secondary | ICD-10-CM | POA: Diagnosis not present

## 2018-06-07 NOTE — Telephone Encounter (Signed)
Noted.  Can discuss further at follow-up.

## 2018-06-10 DIAGNOSIS — B009 Herpesviral infection, unspecified: Secondary | ICD-10-CM | POA: Insufficient documentation

## 2018-06-10 NOTE — Progress Notes (Signed)
Subjective:    Patient ID: Danny Johnson, male    DOB: September 11, 1950, 68 y.o.   MRN: [id number]  HPI The patient is here for an acute visit.  His girlfriend was just diagnosed with HPV.  He knows that this most of come from him and he thinks that his previous girlfriend probably gave it to him.  He denies any symptoms.  He has multiple questions regarding this and what he needs to be watching out for.  For short.  He has been experiencing loose stools to diarrhea.  He also states increased gas.  He has abdominal soreness at times, but denies any abdominal pain.  He denies blood in the stool, nausea and heartburn.  He has been eating much healthier-lots of raw vegetables and salads.  He is also had some increased stress recently and thinks he probably has a IBS.  He wonders what he can do to help this.    Medications and allergies reviewed with patient and updated if appropriate.  Patient Active Problem List   Diagnosis Date Noted  . Herpes simplex 06/10/2018  . Obesity (BMI 30.0-34.9) 09/26/2017  . Umbilical hernia with obstruction but no gangrene 09/26/2017  . Right inguinal hernia 09/26/2017  . Dysfunction of left eustachian tube 09/26/2017  . Prediabetes 10/07/2015  . Coronary atherosclerosis of native coronary artery 07/09/2011  . ERECTILE DYSFUNCTION, ORGANIC 11/13/2009  . Allergic rhinitis 06/19/2009  . Aortic stenosis 11/05/2008  . Hyperlipidemia 03/18/2008  . Anxiety state 03/18/2008  . Essential hypertension 03/18/2008  . PVC's (premature ventricular contractions) 03/18/2008    Current Outpatient Medications on File Prior to Visit  Medication Sig Dispense Refill  . albuterol (PROAIR HFA) 108 (90 Base) MCG/ACT inhaler Inhale 2 puffs into the lungs every 6 (six) hours as needed for wheezing or shortness of breath. 3 Inhaler 2  . aspirin 81 MG tablet Take 81 mg by mouth daily.      Marland Kitchen atorvastatin (LIPITOR) 10 MG tablet TAKE 1 TABLET BY MOUTH DAILY GENERIC EQUIVALENT FOR  LIPITOR 90 tablet 2  . Fluticasone-Salmeterol (ADVAIR DISKUS) 250-50 MCG/DOSE AEPB Inhale 1 puff into the lungs 2 (two) times daily. 1 each 11  . quinapril (ACCUPRIL) 20 MG tablet TAKE 1 TABLET BY MOUTH DAILY 90 tablet 1  . sildenafil (REVATIO) 20 MG tablet TAKE 3 TO 5 TABLETS BY MOUTH DAILY AS NEEDED 30 tablet 0  . venlafaxine XR (EFFEXOR-XR) 37.5 MG 24 hr capsule TAKE 1 CAPSULE BY MOUTH ONCE DAILY WITH BREAKFAST 90 capsule 1  . VENTOLIN HFA 108 (90 Base) MCG/ACT inhaler INHALE 2 PUFFS BY MOUTH INTO THE LUNGS EVERY 6 HOURS AS NEEDED FOR WHEEZING OR SHORTNESS OF BREATH 54 g 5   No current facility-administered medications on file prior to visit.     Past Medical History:  Diagnosis Date  . Anxiety   . Aortic stenosis    Mild to moderate  . Arthritis   . Asthma   . Bicuspid aortic valve   . Coronary atherosclerosis of native coronary artery    Minimal, LVEF 60%  . Essential hypertension, benign   . Melanoma ([place]) 1993  . Mixed hyperlipidemia   . PVC's (premature ventricular contractions)     Past Surgical History:  Procedure Laterality Date  . TONSILLECTOMY      Social History   Socioeconomic History  . Marital status: Divorced    Spouse name: Not on file  . Number of children: Not on file  . Years of education:  Not on file  . Highest education level: Not on file  Occupational History  . Not on file  Social Needs  . Financial resource strain: Not on file  . Food insecurity:    Worry: Not on file    Inability: Not on file  . Transportation needs:    Medical: Not on file    Non-medical: Not on file  Tobacco Use  . Smoking status: Current Some Day Smoker    Packs/day: 1.00    Years: 10.00    Pack years: 10.00    Types: Cigarettes    Start date: 07/21/2001  . Smokeless tobacco: Never Used  Substance and Sexual Activity  . Alcohol use: Yes    Alcohol/week: 42.0 standard drinks    Types: 42 Standard drinks or equivalent per week    Comment: 4-6 drinks daily,  beer/wine  . Drug use: No  . Sexual activity: Not on file  Lifestyle  . Physical activity:    Days per week: Not on file    Minutes per session: Not on file  . Stress: Not on file  Relationships  . Social connections:    Talks on phone: Not on file    Gets together: Not on file    Attends religious service: Not on file    Active member of club or organization: Not on file    Attends meetings of clubs or organizations: Not on file    Relationship status: Not on file  Other Topics Concern  . Not on file  Social History Narrative  . Not on file    Family History  Problem Relation Age of Onset  . Dementia Mother     Review of Systems  Constitutional: Negative for chills and fever.  Gastrointestinal: Positive for diarrhea. Negative for abdominal pain (ache at times), blood in stool, constipation, nausea and vomiting.       No gerd  Genitourinary: Negative for genital sores.       Objective:   Vitals:   06/12/18 0800  BP: 140/82  Pulse: 71  Resp: 16  Temp: 98.4 F (36.9 C)  SpO2: 97%   BP Readings from Last 3 Encounters:  06/12/18 140/82  01/18/18 128/84  01/05/18 138/60   Wt Readings from Last 3 Encounters:  06/12/18 258 lb (117 kg)  01/18/18 248 lb 12.8 oz (112.9 kg)  01/05/18 248 lb (112.5 kg)   Body mass index is 34.04 kg/m.   Physical Exam Constitutional:      General: He is not in acute distress.    Appearance: Normal appearance. He is not ill-appearing.  Abdominal:     General: There is no distension.     Tenderness: There is no abdominal tenderness.  Skin:    General: Skin is warm and dry.  Neurological:     Mental Status: He is alert.            Assessment & Plan:    See Problem List for Assessment and Plan of chronic medical problems.

## 2018-06-12 ENCOUNTER — Encounter: Payer: Self-pay | Admitting: Internal Medicine

## 2018-06-12 ENCOUNTER — Ambulatory Visit: Payer: Medicare Other | Admitting: Internal Medicine

## 2018-06-12 VITALS — BP 140/82 | HR 71 | Temp 98.4°F | Resp 16 | Ht 73.0 in | Wt 258.0 lb

## 2018-06-12 DIAGNOSIS — R195 Other fecal abnormalities: Secondary | ICD-10-CM | POA: Insufficient documentation

## 2018-06-12 DIAGNOSIS — A64 Unspecified sexually transmitted disease: Secondary | ICD-10-CM | POA: Insufficient documentation

## 2018-06-12 NOTE — Patient Instructions (Signed)
Make the changes in your diet as discussed.  Most likely your stomach symptoms will go away.  If they do not let me know.

## 2018-06-12 NOTE — Assessment & Plan Note (Signed)
He is experiencing loose stools-diarrhea with some abdominal soreness Likely related to diet full of raw vegetables and salads Recent stress is also not helping Discussed decreasing more vegetables and salads Can also try Metamucil If no weight loss, blood in the stool or other concerning symptoms He will let me know if the symptoms do not improve with the above

## 2018-06-12 NOTE — Assessment & Plan Note (Signed)
Likely HPV positive-he believes he was going to give this to his girlfriend-there is no one else that could have done that Discussed the the virus and potential consequences of having it-he is low risk for anal or penile cancer given the subtypes that his girlfriend had He will monitor for any concerning lesions

## 2018-06-18 ENCOUNTER — Telehealth: Payer: Self-pay | Admitting: Student

## 2018-06-18 ENCOUNTER — Encounter: Payer: Self-pay | Admitting: Internal Medicine

## 2018-06-18 DIAGNOSIS — B977 Papillomavirus as the cause of diseases classified elsewhere: Secondary | ICD-10-CM

## 2018-06-18 NOTE — Telephone Encounter (Signed)
   [facility] called to report that the patient had 9 beats NSVT. His return rhythm was normal sinus rhythm. Was not listed as having any associated symptoms at that time. I have asked for the strips to be faxed over to the office for review. Will forward to Dr. Domenic Polite to make him aware.  Signed, Erma Heritage, PA-C 06/18/2018, 5:18 PM Pager: [phone]

## 2018-06-23 ENCOUNTER — Telehealth: Payer: Self-pay | Admitting: Internal Medicine

## 2018-06-23 NOTE — Telephone Encounter (Signed)
I called [facility] and they said he needs to be tested for HPV before he can be seen  Please advise

## 2018-06-23 NOTE — Telephone Encounter (Signed)
Copied from [id number]. Topic: Quick Communication - See Telephone Encounter >> Jun 23, 2018 12:01 PM Blase Mess A wrote: CRM for notification. See Telephone encounter for: 06/23/18.  Margaret from infectious disease is calling because a referral was placed to infectious disease instead of  [facility].  Can the referral be corrected?

## 2018-06-23 NOTE — Telephone Encounter (Signed)
Mary/ Cecille Rubin can this be changed on your end?

## 2018-06-23 NOTE — Telephone Encounter (Signed)
Can we make referral go to [facility] instead? Thanks!

## 2018-06-26 NOTE — Telephone Encounter (Signed)
Please call him - let him know [facility] will not see him unless he is tested for HPV.  The only test for men that is done is an anal swab  - this is typically done for homosexual men - his risk for anal HPV is very low and this test will likely be negative.  There is no other test that I know of for men.    I can refer him to [facility]  - they may see him and discuss this further w/o any test.  Let  Me know

## 2018-06-27 ENCOUNTER — Encounter: Payer: Self-pay | Admitting: Internal Medicine

## 2018-06-27 NOTE — Telephone Encounter (Signed)
LVM for pt to call back in regards.  

## 2018-06-27 NOTE — Telephone Encounter (Signed)
Pt aware of response. States he has noticed a place in his genital area that he never noticed before that he would like checked. I did not know if it was appropriate for him to see you for this or to go ahead and do a referral to ID and have them look at it. Please advise on what you think would be best.

## 2018-06-28 ENCOUNTER — Encounter: Payer: Self-pay | Admitting: Internal Medicine

## 2018-06-28 ENCOUNTER — Other Ambulatory Visit: Payer: Self-pay

## 2018-06-28 MED ORDER — QUINAPRIL HCL 20 MG PO TABS: 20.0000 mg | ORAL_TABLET | Freq: Every day | ORAL | 1 refills | Status: DC

## 2018-06-28 NOTE — Telephone Encounter (Signed)
Made an appointment to see Dr. Quay Burow tomorrow.

## 2018-06-28 NOTE — Progress Notes (Signed)
Subjective:    Patient ID: Danny Johnson, male    DOB: December 11, 1950, 68 y.o.   MRN: [id number]  HPI The patient is here for an acute visit.   Penile lesion, concern for HPV, cancer:  He noticed a lesion on the head of his penis around 1/21.  He never noticed it before but admits he does not inspect his genitals.  He denies pain.  He states it is the size of the head of a pen.   His girlfriend was recently diagnosed with HPV which is new for her and he must have given it to her.  He is very concerned he likely has it and is at risk for genital warts and cancer.      Medications and allergies reviewed with patient and updated if appropriate.  Patient Active Problem List   Diagnosis Date Noted  . Loose stools 06/12/2018  . STD (sexually transmitted disease) 06/12/2018  . Herpes simplex 06/10/2018  . Obesity (BMI 30.0-34.9) 09/26/2017  . Umbilical hernia with obstruction but no gangrene 09/26/2017  . Right inguinal hernia 09/26/2017  . Dysfunction of left eustachian tube 09/26/2017  . Prediabetes 10/07/2015  . Coronary atherosclerosis of native coronary artery 07/09/2011  . ERECTILE DYSFUNCTION, ORGANIC 11/13/2009  . Allergic rhinitis 06/19/2009  . Aortic stenosis 11/05/2008  . Hyperlipidemia 03/18/2008  . Anxiety state 03/18/2008  . Essential hypertension 03/18/2008  . PVC's (premature ventricular contractions) 03/18/2008    Current Outpatient Medications on File Prior to Visit  Medication Sig Dispense Refill  . albuterol (PROAIR HFA) 108 (90 Base) MCG/ACT inhaler Inhale 2 puffs into the lungs every 6 (six) hours as needed for wheezing or shortness of breath. 3 Inhaler 2  . aspirin 81 MG tablet Take 81 mg by mouth daily.      Marland Kitchen atorvastatin (LIPITOR) 10 MG tablet TAKE 1 TABLET BY MOUTH DAILY GENERIC EQUIVALENT FOR LIPITOR 90 tablet 2  . Fluticasone-Salmeterol (ADVAIR DISKUS) 250-50 MCG/DOSE AEPB Inhale 1 puff into the lungs 2 (two) times daily. 1 each 11  . quinapril (ACCUPRIL)  20 MG tablet Take 1 tablet (20 mg total) by mouth daily. 90 tablet 1  . sildenafil (REVATIO) 20 MG tablet TAKE 3 TO 5 TABLETS BY MOUTH DAILY AS NEEDED 30 tablet 0  . venlafaxine XR (EFFEXOR-XR) 37.5 MG 24 hr capsule TAKE 1 CAPSULE BY MOUTH ONCE DAILY WITH BREAKFAST 90 capsule 1  . VENTOLIN HFA 108 (90 Base) MCG/ACT inhaler INHALE 2 PUFFS BY MOUTH INTO THE LUNGS EVERY 6 HOURS AS NEEDED FOR WHEEZING OR SHORTNESS OF BREATH 54 g 5   No current facility-administered medications on file prior to visit.     Past Medical History:  Diagnosis Date  . Anxiety   . Aortic stenosis    Mild to moderate  . Arthritis   . Asthma   . Bicuspid aortic valve   . Coronary atherosclerosis of native coronary artery    Minimal, LVEF 60%  . Essential hypertension, benign   . Melanoma ([place]) 1993  . Mixed hyperlipidemia   . PVC's (premature ventricular contractions)     Past Surgical History:  Procedure Laterality Date  . TONSILLECTOMY      Social History   Socioeconomic History  . Marital status: Divorced    Spouse name: Not on file  . Number of children: Not on file  . Years of education: Not on file  . Highest education level: Not on file  Occupational History  . Not on file  Social Needs  . Financial resource strain: Not on file  . Food insecurity:    Worry: Not on file    Inability: Not on file  . Transportation needs:    Medical: Not on file    Non-medical: Not on file  Tobacco Use  . Smoking status: Current Some Day Smoker    Packs/day: 1.00    Years: 10.00    Pack years: 10.00    Types: Cigarettes    Start date: 07/21/2001  . Smokeless tobacco: Never Used  Substance and Sexual Activity  . Alcohol use: Yes    Alcohol/week: 42.0 standard drinks    Types: 42 Standard drinks or equivalent per week    Comment: 4-6 drinks daily, beer/wine  . Drug use: No  . Sexual activity: Not on file  Lifestyle  . Physical activity:    Days per week: Not on file    Minutes per session: Not on  file  . Stress: Not on file  Relationships  . Social connections:    Talks on phone: Not on file    Gets together: Not on file    Attends religious service: Not on file    Active member of club or organization: Not on file    Attends meetings of clubs or organizations: Not on file    Relationship status: Not on file  Other Topics Concern  . Not on file  Social History Narrative  . Not on file    Family History  Problem Relation Age of Onset  . Dementia Mother     Review of Systems Pre HPI    Objective:   Vitals:   06/29/18 0809  BP: (!) 144/90  Pulse: (!) 59  Resp: 16  Temp: 98.9 F (37.2 C)  SpO2: 97%   BP Readings from Last 3 Encounters:  06/29/18 (!) 144/90  06/12/18 140/82  01/18/18 128/84   Wt Readings from Last 3 Encounters:  06/29/18 257 lb (116.6 kg)  06/12/18 258 lb (117 kg)  01/18/18 248 lb 12.8 oz (112.9 kg)   Body mass index is 33.91 kg/m.   Physical Exam Constitutional:      Appearance: Normal appearance.  Genitourinary:    Comments: Penis normal anterior base of head there is a dark spot that is not raised and is nontender - looks like a freckle.  No erythema, discharge  Neurological:     Mental Status: He is alert.  Psychiatric:     Comments: anxious            Assessment & Plan:    See Problem List for Assessment and Plan of chronic medical problems.

## 2018-06-28 NOTE — Telephone Encounter (Signed)
Can see derm but it may take a while to get in.  Can come see me or urology.  It will take too long to get into ID

## 2018-06-29 ENCOUNTER — Ambulatory Visit: Payer: Medicare Other | Admitting: Internal Medicine

## 2018-06-29 ENCOUNTER — Encounter: Payer: Self-pay | Admitting: Internal Medicine

## 2018-06-29 VITALS — BP 144/90 | HR 59 | Temp 98.9°F | Resp 16 | Ht 73.0 in | Wt 257.0 lb

## 2018-06-29 DIAGNOSIS — N489 Disorder of penis, unspecified: Secondary | ICD-10-CM | POA: Insufficient documentation

## 2018-06-29 NOTE — Patient Instructions (Signed)
A referral for dermatology was ordered.

## 2018-06-29 NOTE — Assessment & Plan Note (Signed)
The concerning lesion looks like a freckle - not a wart or early cancer He is very anxious about this and I would like him to have a second opinion - will refer to derm Discussed his low risk of complications for HPV and no current test for him Recommend annual derm visit for skin cancer screening given his history of melanoma and can monitor for genital lesions

## 2018-07-03 NOTE — Progress Notes (Signed)
Cardiology Office Note  Date: 07/04/2018   ID: Danny Johnson, DOB [birth year], MRN [id number]  PCP: Binnie Rail, MD  Primary Cardiologist: Rozann Lesches, MD   Chief Complaint  Patient presents with  . Aortic Stenosis    History of Present Illness: Danny Johnson is a 68 y.o. male last seen in August 2019.  He presents for a routine visit.  Since I saw him he has had no pronounced episodes of palpitations.  Still working part-time at [employer].  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  He has not noticed any change in stamina or increasing shortness of breath.  No chest pain or syncope.  Recent Holter monitor and follow-up echocardiogram are outlined below.  I personally reviewed his ECG today which shows sinus rhythm with left atrial enlargement.  I reviewed his medications which are outlined below and stable from a cardiac perspective.  Past Medical History:  Diagnosis Date  . Anxiety   . Aortic stenosis    Mild to moderate  . Arthritis   . Asthma   . Bicuspid aortic valve   . Coronary atherosclerosis of native coronary artery    Minimal, LVEF 60%  . Essential hypertension, benign   . Melanoma ([place]) 1993  . Mixed hyperlipidemia   . PVC's (premature ventricular contractions)     Past Surgical History:  Procedure Laterality Date  . HERNIA REPAIR  12/2017  . TONSILLECTOMY      Current Outpatient Medications  Medication Sig Dispense Refill  . albuterol (PROAIR HFA) 108 (90 Base) MCG/ACT inhaler Inhale 2 puffs into the lungs every 6 (six) hours as needed for wheezing or shortness of breath. 3 Inhaler 2  . aspirin 81 MG tablet Take 81 mg by mouth daily.      Marland Kitchen atorvastatin (LIPITOR) 10 MG tablet TAKE 1 TABLET BY MOUTH DAILY GENERIC EQUIVALENT FOR LIPITOR 90 tablet 2  . Fluticasone-Salmeterol (ADVAIR DISKUS) 250-50 MCG/DOSE AEPB Inhale 1 puff into the lungs 2 (two) times daily. 1 each 11  . quinapril (ACCUPRIL) 20 MG  tablet Take 1 tablet (20 mg total) by mouth daily. 90 tablet 1  . sildenafil (REVATIO) 20 MG tablet TAKE 3 TO 5 TABLETS BY MOUTH DAILY AS NEEDED 30 tablet 0  . venlafaxine XR (EFFEXOR-XR) 37.5 MG 24 hr capsule TAKE 1 CAPSULE BY MOUTH ONCE DAILY WITH BREAKFAST 90 capsule 1  . VENTOLIN HFA 108 (90 Base) MCG/ACT inhaler INHALE 2 PUFFS BY MOUTH INTO THE LUNGS EVERY 6 HOURS AS NEEDED FOR WHEEZING OR SHORTNESS OF BREATH 54 g 5   No current facility-administered medications for this visit.    Allergies:  Patient has no known allergies.   Social History: The patient  reports that he has been smoking cigarettes. He started smoking about 16 years ago. He has a 10.00 pack-year smoking history. He has never used smokeless tobacco. He reports current alcohol use of about 42.0 standard drinks of alcohol per week. He reports that he does not use drugs.   ROS:  Please see the history of present illness. Otherwise, complete review of systems is positive for none.  All other systems are reviewed and negative.   Physical Exam: VS:  BP 132/74   Pulse 68   Ht 6\' 2"  (1.88 m)   Wt 256 lb (116.1 kg)   SpO2 97%   BMI 32.87 kg/m , BMI Body mass index is 32.87 kg/m.  Wt Readings  from Last 3 Encounters:  07/04/18 256 lb (116.1 kg)  06/29/18 257 lb (116.6 kg)  06/12/18 258 lb (117 kg)    General: Patient appears comfortable at rest. HEENT: Conjunctiva and lids normal, oropharynx clear. Neck: Supple, no elevated JVP or carotid bruits, no thyromegaly. Lungs: Clear to auscultation, nonlabored breathing at rest. Cardiac: Regular rate and rhythm, no S3, 3/6 systolic murmur. Abdomen: Soft, nontender, bowel sounds present. Extremities: No pitting edema, distal pulses 2+. Skin: Warm and dry. Musculoskeletal: No kyphosis. Neuropsychiatric: Alert and oriented x3, affect grossly appropriate.  ECG: I personally reviewed the tracing from 07/26/2017 which showed normal sinus rhythm.  Recent Labwork: 09/26/2017: ALT 24;  AST 23; BUN 11; Creatinine, Ser 0.82; Hemoglobin 16.3; Platelets 224.0; Potassium 4.0; Sodium 140; TSH 1.38     Component Value Date/Time   CHOL 160 09/26/2017 1026   TRIG 76.0 09/26/2017 1026   TRIG 89 10/25/2008   HDL 53.40 09/26/2017 1026   CHOLHDL 3 09/26/2017 1026   VLDL 15.2 09/26/2017 1026   LDLCALC 91 09/26/2017 1026   LDLDIRECT 153.1 06/29/2012 0929    Other Studies Reviewed Today:  Holter monitor 05/30/2018: 24-hour Holter monitor reviewed.  Sinus rhythm is present throughout.  Heart rate ranged from 48 bpm up to 121 bpm with average heart rate 76 bpm.  Rare PACs were noted with a brief 4 beat burst of PAT.  Overall rare to occasional PVCs with few episodes of bigeminy, no sustained arrhythmias.  PVC burden was only 3.4% of total beats.  There were no significant pauses.  Echocardiogram 05/29/2018: Study Conclusions  - Left ventricle: The cavity size was normal. Wall thickness was   increased in a pattern of moderate LVH. Systolic function was   normal. The estimated ejection fraction was in the range of 55%   to 60%. Diastolic function is indeterminant. - Aortic valve: Severely calcified annulus. Trileaflet; severely   thickened leaflets. There was moderate to severe stenosis. There   was mild regurgitation. Mean gradient (S): 34 mm Hg. VTI ratio of   LVOT to aortic valve: 0.21. Valve area (VTI): 0.73 cm^2. Valve   area (Vmax): 0.87 cm^2. Valve area (Vmean): 0.78 cm^2. - Mitral valve: There was mild regurgitation. - Left atrium: The atrium was mildly dilated.  Assessment and Plan:  1.  Bicuspid aortic valve with moderate to severe aortic stenosis, mean gradient 34 mmHg and stable.  He has had no functional decline or new exertional symptoms and will continue with observation with a follow-up echocardiogram in 6 months.  2.  Intermittent palpitations, predominantly PVCs but also PACs and brief episodes of PAT the most recent monitoring.  No sustained events, continue with  observation for now.  3.  Mixed hyperlipidemia on Lipitor.  He follows with Dr. Quay Burow.  4.  Tobacco abuse in remission.  Current medicines were reviewed with the patient today.   Orders Placed This Encounter  Procedures  . EKG 12-Lead  . ECHOCARDIOGRAM COMPLETE    Disposition: Follow-up in 6 months.  Signed, Satira Sark, MD, [facility] 07/04/2018 1:45 PM    [place] [facility] [address], [place], [place] [address] Phone: [phone]; Fax: [phone]

## 2018-07-04 ENCOUNTER — Ambulatory Visit: Payer: Medicare Other | Admitting: Cardiology

## 2018-07-04 ENCOUNTER — Encounter: Payer: Self-pay | Admitting: Cardiology

## 2018-07-04 VITALS — BP 132/74 | HR 68 | Ht 74.0 in | Wt 256.0 lb

## 2018-07-04 DIAGNOSIS — F17201 Nicotine dependence, unspecified, in remission: Secondary | ICD-10-CM | POA: Diagnosis not present

## 2018-07-04 DIAGNOSIS — E782 Mixed hyperlipidemia: Secondary | ICD-10-CM | POA: Diagnosis not present

## 2018-07-04 DIAGNOSIS — I350 Nonrheumatic aortic (valve) stenosis: Secondary | ICD-10-CM

## 2018-07-04 DIAGNOSIS — I493 Ventricular premature depolarization: Secondary | ICD-10-CM

## 2018-07-04 NOTE — Patient Instructions (Signed)
Medication Instructions:  Your physician recommends that you continue on your current medications as directed. Please refer to the Current Medication list given to you today.  If you need a refill on your cardiac medications before your next appointment, please call your pharmacy.   Lab work: None today If you have labs (blood work) drawn today and your tests are completely normal, you will receive your results only by: Marland Kitchen MyChart Message (if you have MyChart) OR . A paper copy in the mail If you have any lab test that is abnormal or we need to change your treatment, we will call you to review the results.  Testing/Procedures: Your physician has requested that you have an echocardiogram in 6 months. Echocardiography is a painless test that uses sound waves to create images of your heart. It provides your doctor with information about the size and shape of your heart and how well your heart's chambers and valves are working. This procedure takes approximately one hour. There are no restrictions for this procedure.    Follow-Up: At [facility], you and your health needs are our priority.  As part of our continuing mission to provide you with exceptional heart care, we have created designated Provider Care Teams.  These Care Teams include your primary Cardiologist (physician) and Advanced Practice Providers (APPs -  Physician Assistants and Nurse Practitioners) who all work together to provide you with the care you need, when you need it. You will need a follow up appointment in 6 months.  Please call our office 2 months in advance to schedule this appointment.  You may see Rozann Lesches, MD or one of the following Advanced Practice Providers on your designated Care Team:   Bernerd Pho, PA-C [facility]) . Ermalinda Barrios, PA-C ([place])  Any Other Special Instructions Will Be Listed Below (If Applicable). None

## 2018-07-14 DIAGNOSIS — A630 Anogenital (venereal) warts: Secondary | ICD-10-CM | POA: Diagnosis not present

## 2018-07-25 DIAGNOSIS — Z0120 Encounter for dental examination and cleaning without abnormal findings: Secondary | ICD-10-CM | POA: Diagnosis not present

## 2018-08-29 ENCOUNTER — Other Ambulatory Visit: Payer: Self-pay | Admitting: Internal Medicine

## 2018-09-25 ENCOUNTER — Encounter: Payer: Self-pay | Admitting: Internal Medicine

## 2018-10-25 ENCOUNTER — Other Ambulatory Visit: Payer: Self-pay | Admitting: Internal Medicine

## 2018-10-27 ENCOUNTER — Telehealth: Payer: Self-pay | Admitting: Internal Medicine

## 2018-10-27 ENCOUNTER — Other Ambulatory Visit: Payer: Self-pay | Admitting: Internal Medicine

## 2018-10-27 DIAGNOSIS — R7303 Prediabetes: Secondary | ICD-10-CM

## 2018-10-27 DIAGNOSIS — Z125 Encounter for screening for malignant neoplasm of prostate: Secondary | ICD-10-CM

## 2018-10-27 DIAGNOSIS — E782 Mixed hyperlipidemia: Secondary | ICD-10-CM

## 2018-10-27 DIAGNOSIS — Z0000 Encounter for general adult medical examination without abnormal findings: Secondary | ICD-10-CM

## 2018-10-27 DIAGNOSIS — I10 Essential (primary) hypertension: Secondary | ICD-10-CM

## 2018-10-27 MED ORDER — VENLAFAXINE HCL ER 37.5 MG PO CP24: ORAL_CAPSULE | ORAL | 0 refills | Status: DC

## 2018-10-27 NOTE — Telephone Encounter (Signed)
Patient is scheduled for CPE on 6/30.  Patient is requesting Burns to enter labs before appt.  Please advise.

## 2018-10-27 NOTE — Telephone Encounter (Signed)
Pls advise if ok to refill../lmb 

## 2018-10-27 NOTE — Telephone Encounter (Signed)
Blood work ordered.

## 2018-10-30 NOTE — Telephone Encounter (Signed)
Pt aware.

## 2018-11-05 ENCOUNTER — Encounter: Payer: Self-pay | Admitting: Internal Medicine

## 2018-11-06 ENCOUNTER — Telehealth: Payer: Self-pay

## 2018-11-06 MED ORDER — ASPIRIN EC 81 MG PO TBEC: 81.0000 mg | DELAYED_RELEASE_TABLET | ORAL | 3 refills | Status: DC

## 2018-11-06 NOTE — Telephone Encounter (Signed)
See MyChart note, per Dr.McDowell, pt can take ASA 81 mg QOD

## 2018-11-14 ENCOUNTER — Other Ambulatory Visit (INDEPENDENT_AMBULATORY_CARE_PROVIDER_SITE_OTHER): Payer: Medicare Other

## 2018-11-14 DIAGNOSIS — E782 Mixed hyperlipidemia: Secondary | ICD-10-CM

## 2018-11-14 DIAGNOSIS — Z125 Encounter for screening for malignant neoplasm of prostate: Secondary | ICD-10-CM | POA: Diagnosis not present

## 2018-11-14 DIAGNOSIS — R7303 Prediabetes: Secondary | ICD-10-CM | POA: Diagnosis not present

## 2018-11-14 DIAGNOSIS — I10 Essential (primary) hypertension: Secondary | ICD-10-CM

## 2018-11-14 DIAGNOSIS — Z0000 Encounter for general adult medical examination without abnormal findings: Secondary | ICD-10-CM | POA: Diagnosis not present

## 2018-11-14 LAB — CBC WITH DIFFERENTIAL/PLATELET
Basophils Absolute: 0.1 10*3/uL (ref 0.0–0.1)
Basophils Relative: 1.1 % (ref 0.0–3.0)
Eosinophils Absolute: 0.4 10*3/uL (ref 0.0–0.7)
Eosinophils Relative: 6.8 % — ABNORMAL HIGH (ref 0.0–5.0)
HCT: 47 % (ref 39.0–52.0)
Hemoglobin: 15.9 g/dL (ref 13.0–17.0)
Lymphocytes Relative: 29.5 % (ref 12.0–46.0)
Lymphs Abs: 1.9 10*3/uL (ref 0.7–4.0)
MCHC: 33.8 g/dL (ref 30.0–36.0)
MCV: 100.4 fl — ABNORMAL HIGH (ref 78.0–100.0)
Monocytes Absolute: 0.8 10*3/uL (ref 0.1–1.0)
Monocytes Relative: 11.9 % (ref 3.0–12.0)
Neutro Abs: 3.3 10*3/uL (ref 1.4–7.7)
Neutrophils Relative %: 50.7 % (ref 43.0–77.0)
Platelets: 211 10*3/uL (ref 150.0–400.0)
RBC: 4.69 Mil/uL (ref 4.22–5.81)
RDW: 13 % (ref 11.5–15.5)
WBC: 6.5 10*3/uL (ref 4.0–10.5)

## 2018-11-14 LAB — COMPREHENSIVE METABOLIC PANEL
ALT: 35 U/L (ref 0–53)
AST: 29 U/L (ref 0–37)
Albumin: 4.5 g/dL (ref 3.5–5.2)
Alkaline Phosphatase: 78 U/L (ref 39–117)
BUN: 14 mg/dL (ref 6–23)
CO2: 25 mEq/L (ref 19–32)
Calcium: 8.8 mg/dL (ref 8.4–10.5)
Chloride: 104 mEq/L (ref 96–112)
Creatinine, Ser: 0.8 mg/dL (ref 0.40–1.50)
GFR: 95.61 mL/min (ref >60.00)
Glucose, Bld: 110 mg/dL — ABNORMAL HIGH (ref 70–99)
Potassium: 3.9 mEq/L (ref 3.5–5.1)
Sodium: 139 mEq/L (ref 135–145)
Total Bilirubin: 1 mg/dL (ref 0.2–1.2)
Total Protein: 6.5 g/dL (ref 6.0–8.3)

## 2018-11-14 LAB — URINALYSIS
Bilirubin Urine: NEGATIVE
Leukocytes,Ua: NEGATIVE
Nitrite: NEGATIVE
Specific Gravity, Urine: 1.025 (ref 1.000–1.030)
Total Protein, Urine: NEGATIVE
Urine Glucose: NEGATIVE
Urobilinogen, UA: 0.2 (ref 0.0–1.0)
pH: 5.5 (ref 5.0–8.0)

## 2018-11-14 LAB — TSH: TSH: 1.68 u[IU]/mL (ref 0.35–4.50)

## 2018-11-14 LAB — LIPID PANEL
Cholesterol: 177 mg/dL (ref 0–200)
HDL: 59.5 mg/dL (ref >39.00)
LDL Cholesterol: 102 mg/dL — ABNORMAL HIGH (ref 0–99)
NonHDL: 117.83
Total CHOL/HDL Ratio: 3
Triglycerides: 81 mg/dL (ref 0.0–149.0)
VLDL: 16.2 mg/dL (ref 0.0–40.0)

## 2018-11-14 LAB — HEMOGLOBIN A1C: Hgb A1c MFr Bld: 5.8 % (ref 4.6–6.5)

## 2018-11-14 LAB — PSA, MEDICARE: PSA: 0.38 ng/ml (ref 0.10–4.00)

## 2018-11-15 ENCOUNTER — Other Ambulatory Visit: Payer: Self-pay | Admitting: Internal Medicine

## 2018-11-16 ENCOUNTER — Encounter: Payer: Self-pay | Admitting: Internal Medicine

## 2018-11-20 NOTE — Patient Instructions (Addendum)
Your blood work was reviewed.    All other Health Maintenance issues reviewed.   All recommended immunizations and age-appropriate screenings are up-to-date or discussed.  No immunizations administered today.   Medications reviewed and updated.  Changes include :   none  Your prescription(s) have been submitted to your pharmacy. Please take as directed and contact our office if you believe you are having problem(s) with the medication(s).   Please followup in one year    Health Maintenance, Male Adopting a healthy lifestyle and getting preventive care are important in promoting health and wellness. Ask your health care provider about:  The right schedule for you to have regular tests and exams.  Things you can do on your own to prevent diseases and keep yourself healthy. What should I know about diet, weight, and exercise? Eat a healthy diet   Eat a diet that includes plenty of vegetables, fruits, low-fat dairy products, and lean protein.  Do not eat a lot of foods that are high in solid fats, added sugars, or sodium. Maintain a healthy weight Body mass index (BMI) is a measurement that can be used to identify possible weight problems. It estimates body fat based on height and weight. Your health care provider can help determine your BMI and help you achieve or maintain a healthy weight. Get regular exercise Get regular exercise. This is one of the most important things you can do for your health. Most adults should:  Exercise for at least 150 minutes each week. The exercise should increase your heart rate and make you sweat (moderate-intensity exercise).  Do strengthening exercises at least twice a week. This is in addition to the moderate-intensity exercise.  Spend less time sitting. Even light physical activity can be beneficial. Watch cholesterol and blood lipids Have your blood tested for lipids and cholesterol at 70 years of age, then have this test every 5 years. You  may need to have your cholesterol levels checked more often if:  Your lipid or cholesterol levels are high.  You are older than 70 years of age.  You are at high risk for heart disease. What should I know about cancer screening? Many types of cancers can be detected early and may often be prevented. Depending on your health history and family history, you may need to have cancer screening at various ages. This may include screening for:  Colorectal cancer.  Prostate cancer.  Skin cancer.  Lung cancer. What should I know about heart disease, diabetes, and high blood pressure? Blood pressure and heart disease  High blood pressure causes heart disease and increases the risk of stroke. This is more likely to develop in people who have high blood pressure readings, are of African descent, or are overweight.  Talk with your health care provider about your target blood pressure readings.  Have your blood pressure checked: ? Every 3-5 years if you are 29-88 years of age. ? Every year if you are 57 years old or older.  If you are between the ages of 24 and 63 and are a current or former smoker, ask your health care provider if you should have a one-time screening for abdominal aortic aneurysm (AAA). Diabetes Have regular diabetes screenings. This checks your fasting blood sugar level. Have the screening done:  Once every three years after age 70 if you are at a normal weight and have a low risk for diabetes.  More often and at a younger age if you are overweight or have a  high risk for diabetes. What should I know about preventing infection? Hepatitis B If you have a higher risk for hepatitis B, you should be screened for this virus. Talk with your health care provider to find out if you are at risk for hepatitis B infection. Hepatitis C Blood testing is recommended for:  Everyone born from [birth year] through 1967.  Anyone with known risk factors for hepatitis C. Sexually transmitted  infections (STIs)  You should be screened each year for STIs, including gonorrhea and chlamydia, if: ? You are sexually active and are younger than 70 years of age. ? You are older than 70 years of age and your health care provider tells you that you are at risk for this type of infection. ? Your sexual activity has changed since you were last screened, and you are at increased risk for chlamydia or gonorrhea. Ask your health care provider if you are at risk.  Ask your health care provider about whether you are at high risk for HIV. Your health care provider may recommend a prescription medicine to help prevent HIV infection. If you choose to take medicine to prevent HIV, you should first get tested for HIV. You should then be tested every 3 months for as long as you are taking the medicine. Follow these instructions at home: Lifestyle  Do not use any products that contain nicotine or tobacco, such as cigarettes, e-cigarettes, and chewing tobacco. If you need help quitting, ask your health care provider.  Do not use street drugs.  Do not share needles.  Ask your health care provider for help if you need support or information about quitting drugs. Alcohol use  Do not drink alcohol if your health care provider tells you not to drink.  If you drink alcohol: ? Limit how much you have to 0-2 drinks a day. ? Be aware of how much alcohol is in your drink. In the [place] one drink equals one 12 oz bottle of beer (355 mL), one 5 oz glass of wine (148 mL), or one 1 oz glass of hard liquor (44 mL). General instructions  Schedule regular health, dental, and eye exams.  Stay current with your vaccines.  Tell your health care provider if: ? You often feel depressed. ? You have ever been abused or do not feel safe at home. Summary  Adopting a healthy lifestyle and getting preventive care are important in promoting health and wellness.  Follow your health care provider's instructions about  healthy diet, exercising, and getting tested or screened for diseases.  Follow your health care provider's instructions on monitoring your cholesterol and blood pressure. This information is not intended to replace advice given to you by your health care provider. Make sure you discuss any questions you have with your health care provider. Document Released: 11/06/2007 Document Revised: 05/03/2018 Document Reviewed: 05/03/2018 Elsevier Patient Education  2020 [employer].

## 2018-11-20 NOTE — Progress Notes (Signed)
Subjective:    Patient ID: Danny Johnson, male    DOB: 11-07-1950, 68 y.o.   MRN: [id number]  HPI He is here for a physical exam.   He wants to lose weight.  He knows he needs to decrease his carbs.  He is drinking pepsi, which is not normal - he knows he needs to stop that.  He overall, eats fairly healthy.  He is not walking like he used to.  He has not been playing golf.    He has decreased his effexor to every other day - his anxiety is controlled.  He will slowly taper off of it.  He does have some frustration and deprssion regarding everything going on the world, but knows that will get better with time.      Medications and allergies reviewed with patient and updated if appropriate.  Patient Active Problem List   Diagnosis Date Noted  . Penile lesion 06/29/2018  . Loose stools 06/12/2018  . STD (sexually transmitted disease) 06/12/2018  . Herpes simplex 06/10/2018  . Obesity (BMI 30.0-34.9) 09/26/2017  . Umbilical hernia with obstruction but no gangrene 09/26/2017  . Right inguinal hernia 09/26/2017  . Dysfunction of left eustachian tube 09/26/2017  . Prediabetes 10/07/2015  . Coronary atherosclerosis of native coronary artery 07/09/2011  . ERECTILE DYSFUNCTION, ORGANIC 11/13/2009  . Allergic rhinitis 06/19/2009  . Aortic stenosis 11/05/2008  . Hyperlipidemia 03/18/2008  . Anxiety state 03/18/2008  . Essential hypertension 03/18/2008  . PVC's (premature ventricular contractions) 03/18/2008    Current Outpatient Medications on File Prior to Visit  Medication Sig Dispense Refill  . albuterol (PROAIR HFA) 108 (90 Base) MCG/ACT inhaler Inhale 2 puffs into the lungs every 6 (six) hours as needed for wheezing or shortness of breath. 3 Inhaler 2  . aspirin EC 81 MG tablet Take 1 tablet (81 mg total) by mouth every other day. 90 tablet 3  . atorvastatin (LIPITOR) 10 MG tablet TAKE 1 TABLET BY MOUTH DAILY 90 tablet 0  . Fluticasone-Salmeterol (ADVAIR DISKUS) 250-50 MCG/DOSE  AEPB Inhale 1 puff into the lungs 2 (two) times daily. 1 each 11  . quinapril (ACCUPRIL) 20 MG tablet Take 1 tablet (20 mg total) by mouth daily. 90 tablet 1  . sildenafil (REVATIO) 20 MG tablet TAKE 3 TO 5 TABLETS BY MOUTH DAILY AS NEEDED 30 tablet 0  . venlafaxine XR (EFFEXOR-XR) 37.5 MG 24 hr capsule Take 1 capsule by mouth once daily with breakfast. (Patient taking differently: 37.5 mg. Take 1 capsule by mouth once every other day with breakfast.) 90 capsule 0  . VENTOLIN HFA 108 (90 Base) MCG/ACT inhaler INHALE 2 PUFFS BY MOUTH INTO THE LUNGS EVERY 6 HOURS AS NEEDED FOR WHEEZING OR SHORTNESS OF BREATH 54 g 5   No current facility-administered medications on file prior to visit.     Past Medical History:  Diagnosis Date  . Anxiety   . Aortic stenosis    Mild to moderate  . Arthritis   . Asthma   . Bicuspid aortic valve   . Coronary atherosclerosis of native coronary artery    Minimal, LVEF 60%  . Essential hypertension, benign   . Melanoma ([place]) 1993  . Mixed hyperlipidemia   . PVC's (premature ventricular contractions)     Past Surgical History:  Procedure Laterality Date  . HERNIA REPAIR  12/2017  . TONSILLECTOMY      Social History   Socioeconomic History  . Marital status: Divorced    Spouse  name: Not on file  . Number of children: Not on file  . Years of education: Not on file  . Highest education level: Not on file  Occupational History  . Not on file  Social Needs  . Financial resource strain: Not on file  . Food insecurity    Worry: Not on file    Inability: Not on file  . Transportation needs    Medical: Not on file    Non-medical: Not on file  Tobacco Use  . Smoking status: Current Some Day Smoker    Packs/day: 1.00    Years: 10.00    Pack years: 10.00    Types: Cigarettes    Start date: 07/21/2001  . Smokeless tobacco: Never Used  Substance and Sexual Activity  . Alcohol use: Yes    Alcohol/week: 42.0 standard drinks    Types: 42 Standard  drinks or equivalent per week    Comment: 4-6 drinks daily, beer/wine  . Drug use: No  . Sexual activity: Not on file  Lifestyle  . Physical activity    Days per week: Not on file    Minutes per session: Not on file  . Stress: Not on file  Relationships  . Social Herbalist on phone: Not on file    Gets together: Not on file    Attends religious service: Not on file    Active member of club or organization: Not on file    Attends meetings of clubs or organizations: Not on file    Relationship status: Not on file  Other Topics Concern  . Not on file  Social History Narrative  . Not on file    Family History  Problem Relation Age of Onset  . Dementia Mother     Review of Systems  Constitutional: Negative for chills and fever.  Eyes: Negative for visual disturbance.  Respiratory: Negative for cough, shortness of breath and wheezing.   Cardiovascular: Positive for palpitations. Negative for chest pain and leg swelling.  Gastrointestinal: Negative for abdominal pain, blood in stool, constipation, diarrhea and nausea.       Rare gerd  Genitourinary: Negative for difficulty urinating, dysuria and hematuria.  Musculoskeletal: Negative for arthralgias and back pain.  Skin: Negative for color change and rash.  Neurological: Negative for dizziness, light-headedness, numbness and headaches.  Psychiatric/Behavioral: Positive for dysphoric mood (related to world events). The patient is nervous/anxious (controlled).        Objective:   Vitals:   11/21/18 0938  BP: 128/80  Pulse: 83  Resp: 16  Temp: 98.4 F (36.9 C)  SpO2: 98%   Filed Weights   11/21/18 0938  Weight: 256 lb (116.1 kg)   Body mass index is 32.87 kg/m.  Wt Readings from Last 3 Encounters:  11/21/18 256 lb (116.1 kg)  07/04/18 256 lb (116.1 kg)  06/29/18 257 lb (116.6 kg)     Physical Exam Constitutional: He appears well-developed and well-nourished. No distress.  HENT:  Head:  Normocephalic and atraumatic.  Right Ear: External ear normal.  Left Ear: External ear normal.  Mouth/Throat: Oropharynx is clear and moist.  Normal ear canals and TM b/l  Eyes: Conjunctivae and EOM are normal.  Neck: Neck supple. No tracheal deviation present. No thyromegaly present. No carotid bruit  Cardiovascular: Normal rate, regular rhythm, normal heart sounds and intact distal pulses.   2/6 systolic murmur heard. Pulmonary/Chest: Effort normal and breath sounds normal. No respiratory distress. He has no wheezes. He has  no rales.  Abdominal: Obese, Soft. He exhibits no distension. There is no tenderness. Umbilical hernia Genitourinary: deferred  Musculoskeletal: He exhibits no edema.  Lymphadenopathy:   He has no cervical adenopathy.  Skin: Skin is warm and dry. He is not diaphoretic.  Psychiatric: He has a normal mood and affect. His behavior is normal.         Assessment & Plan:   Physical exam: Screening blood work   reviewed Immunizations    Had Shingrix, others up-to-date Colonoscopy    up-to-date Eye exams    up-to-date Exercise  Not regularly Weight  Working on weight loss Skin  No concerns - sees derm annually Substance abuse     Stopped smoking,  - "I drink more than I should" - advised cutting back  See Problem List for Assessment and Plan of chronic medical problems.  FU in one year

## 2018-11-21 ENCOUNTER — Ambulatory Visit (INDEPENDENT_AMBULATORY_CARE_PROVIDER_SITE_OTHER): Payer: Medicare Other | Admitting: Internal Medicine

## 2018-11-21 ENCOUNTER — Encounter: Payer: Self-pay | Admitting: Internal Medicine

## 2018-11-21 ENCOUNTER — Encounter: Payer: Medicare Other | Admitting: Internal Medicine

## 2018-11-21 ENCOUNTER — Other Ambulatory Visit: Payer: Self-pay

## 2018-11-21 VITALS — BP 128/80 | HR 83 | Temp 98.4°F | Resp 16 | Ht 74.0 in | Wt 256.0 lb

## 2018-11-21 DIAGNOSIS — R7303 Prediabetes: Secondary | ICD-10-CM | POA: Diagnosis not present

## 2018-11-21 DIAGNOSIS — Z0000 Encounter for general adult medical examination without abnormal findings: Secondary | ICD-10-CM

## 2018-11-21 DIAGNOSIS — K420 Umbilical hernia with obstruction, without gangrene: Secondary | ICD-10-CM

## 2018-11-21 DIAGNOSIS — E782 Mixed hyperlipidemia: Secondary | ICD-10-CM | POA: Diagnosis not present

## 2018-11-21 DIAGNOSIS — I350 Nonrheumatic aortic (valve) stenosis: Secondary | ICD-10-CM

## 2018-11-21 DIAGNOSIS — N529 Male erectile dysfunction, unspecified: Secondary | ICD-10-CM

## 2018-11-21 DIAGNOSIS — I10 Essential (primary) hypertension: Secondary | ICD-10-CM | POA: Diagnosis not present

## 2018-11-21 DIAGNOSIS — F411 Generalized anxiety disorder: Secondary | ICD-10-CM

## 2018-11-21 DIAGNOSIS — E669 Obesity, unspecified: Secondary | ICD-10-CM

## 2018-11-21 NOTE — Assessment & Plan Note (Signed)
Anxiety controlled Does not feel he needs the effexor and is tapering off of it slowly

## 2018-11-21 NOTE — Assessment & Plan Note (Signed)
Asymptomatic. monitor

## 2018-11-21 NOTE — Assessment & Plan Note (Signed)
BP well controlled Current regimen effective and well tolerated Continue current medications at current doses cmp reviewed - done last week

## 2018-11-21 NOTE — Assessment & Plan Note (Signed)
Lab Results  Component Value Date   HGBA1C 5.8 11/14/2018   Decrease cards, stop drinking pepsi and advised to decrease alcohol intake Increase exercise and decrease portions F/u in one year given stability

## 2018-11-21 NOTE — Assessment & Plan Note (Addendum)
Lipids controlled Continue statin Encouraged regular exercise and weight loss

## 2018-11-21 NOTE — Assessment & Plan Note (Signed)
Moderate to severe Has echo scheduled for next week  asymptomatic

## 2018-11-21 NOTE — Assessment & Plan Note (Signed)
With prediabetes Discussed importance of weight loss Decrease portions, increase exercise Decrease carb/sugar intake

## 2018-11-21 NOTE — Assessment & Plan Note (Signed)
Sildenafil prn

## 2018-11-27 ENCOUNTER — Other Ambulatory Visit: Payer: Self-pay | Admitting: Internal Medicine

## 2018-11-28 ENCOUNTER — Other Ambulatory Visit: Payer: Self-pay | Admitting: Internal Medicine

## 2018-11-30 ENCOUNTER — Other Ambulatory Visit: Payer: Self-pay

## 2018-11-30 ENCOUNTER — Ambulatory Visit (HOSPITAL_COMMUNITY)
Admission: RE | Admit: 2018-11-30 | Discharge: 2018-11-30 | Disposition: A | Discharge status: Home / Self Care | Payer: Medicare Other | Source: Ambulatory Visit | Attending: Cardiology | Admitting: Cardiology

## 2018-11-30 DIAGNOSIS — I350 Nonrheumatic aortic (valve) stenosis: Secondary | ICD-10-CM | POA: Diagnosis not present

## 2018-11-30 NOTE — Progress Notes (Signed)
*  PRELIMINARY RESULTS* Echocardiogram 2D Echocardiogram has been performed.  Danny Johnson 11/30/2018, 1:48 PM

## 2018-12-02 ENCOUNTER — Other Ambulatory Visit: Payer: Self-pay | Admitting: Internal Medicine

## 2018-12-06 DIAGNOSIS — Z85820 Personal history of malignant melanoma of skin: Secondary | ICD-10-CM | POA: Diagnosis not present

## 2018-12-06 DIAGNOSIS — L570 Actinic keratosis: Secondary | ICD-10-CM | POA: Diagnosis not present

## 2018-12-06 DIAGNOSIS — Z808 Family history of malignant neoplasm of other organs or systems: Secondary | ICD-10-CM | POA: Diagnosis not present

## 2018-12-06 DIAGNOSIS — D1801 Hemangioma of skin and subcutaneous tissue: Secondary | ICD-10-CM | POA: Diagnosis not present

## 2019-01-31 ENCOUNTER — Ambulatory Visit: Payer: Medicare Other | Admitting: Cardiology

## 2019-02-21 ENCOUNTER — Other Ambulatory Visit: Payer: Self-pay | Admitting: Internal Medicine

## 2019-03-13 ENCOUNTER — Ambulatory Visit: Payer: Medicare Other | Admitting: Cardiology

## 2019-03-13 ENCOUNTER — Other Ambulatory Visit: Payer: Self-pay

## 2019-03-13 ENCOUNTER — Encounter: Payer: Self-pay | Admitting: Cardiology

## 2019-03-13 VITALS — BP 113/74 | HR 80 | Temp 97.0°F | Ht 74.0 in | Wt 254.0 lb

## 2019-03-13 DIAGNOSIS — I10 Essential (primary) hypertension: Secondary | ICD-10-CM | POA: Diagnosis not present

## 2019-03-13 DIAGNOSIS — I350 Nonrheumatic aortic (valve) stenosis: Secondary | ICD-10-CM

## 2019-03-13 DIAGNOSIS — E782 Mixed hyperlipidemia: Secondary | ICD-10-CM

## 2019-03-13 NOTE — Patient Instructions (Signed)
Medication Instructions:  Your physician recommends that you continue on your current medications as directed. Please refer to the Current Medication list given to you today.  *If you need a refill on your cardiac medications before your next appointment, please call your pharmacy*  Lab Work: None today If you have labs (blood work) drawn today and your tests are completely normal, you will receive your results only by: Marland Kitchen MyChart Message (if you have MyChart) OR . A paper copy in the mail If you have any lab test that is abnormal or we need to change your treatment, we will call you to review the results.  Testing/Procedures: Your physician has requested that you have an echocardiogram IN [place]. Echocardiography is a painless test that uses sound waves to create images of your heart. It provides your doctor with information about the size and shape of your heart and how well your heart's chambers and valves are working. This procedure takes approximately one hour. There are no restrictions for this procedure.    Follow-Up: At [facility], you and your health needs are our priority.  As part of our continuing mission to provide you with exceptional heart care, we have created designated Provider Care Teams.  These Care Teams include your primary Cardiologist (physician) and Advanced Practice Providers (APPs -  Physician Assistants and Nurse Practitioners) who all work together to provide you with the care you need, when you need it.  Your next appointment:   3 months  The format for your next appointment:   In Person  Provider: Dr.McDowell  Other Instructions none

## 2019-03-13 NOTE — Progress Notes (Signed)
Cardiology Office Note  Date: 03/13/2019   ID: Danny Johnson, DOB 09/07/1950, MRN [id number]  PCP:  Binnie Rail, MD  Cardiologist:  Rozann Lesches, MD Electrophysiologist:  None   Chief Complaint  Patient presents with  . Cardiac follow-up    History of Present Illness: Danny Johnson is a 68 y.o. male last seen in February.  He presents for a follow-up visit.  He does not report any exertional angina, no dizziness or syncope.  Still works 3 days a week at [employer] in [place].  He has been playing golf, not quite as active, but still tries to walk a few miles at least a few days a week for exercise.  He does not feel like his stamina has markedly changed.  Lab work from June is outlined below.  Follow-up echocardiogram in July revealed LVEF 55 to 60%, moderate to severe calcific aortic stenosis with mean gradient 34 mmHg.  Overall relatively stable.  I went over the results with him today.  We also discussed his medications.  Past Medical History:  Diagnosis Date  . Anxiety   . Aortic stenosis    Mild to moderate  . Arthritis   . Asthma   . Bicuspid aortic valve   . Coronary atherosclerosis of native coronary artery    Minimal, LVEF 60%  . Essential hypertension, benign   . Melanoma ([place]) 1993  . Mixed hyperlipidemia   . PVC's (premature ventricular contractions)     Past Surgical History:  Procedure Laterality Date  . HERNIA REPAIR  12/2017  . TONSILLECTOMY      Current Outpatient Medications  Medication Sig Dispense Refill  . ADVAIR DISKUS 250-50 MCG/DOSE AEPB INHALE 1 DOSE BY MOUTH TWICE DAILY 180 each 0  . aspirin EC 81 MG tablet Take 1 tablet (81 mg total) by mouth every other day. 90 tablet 3  . atorvastatin (LIPITOR) 10 MG tablet TAKE 1 TABLET BY MOUTH DAILY 90 tablet 1  . quinapril (ACCUPRIL) 20 MG tablet TAKE 1 TABLET BY MOUTH DAILY 90 tablet 1  . sildenafil (REVATIO) 20 MG tablet TAKE 3 TO 5 TABLETS BY MOUTH DAILY AS NEEDED 30 tablet 0  .  venlafaxine XR (EFFEXOR-XR) 37.5 MG 24 hr capsule Take 1 capsule by mouth once daily with breakfast. (Patient taking differently: 37.5 mg. Take 1 capsule by mouth once every other day with breakfast.) 90 capsule 0  . VENTOLIN HFA 108 (90 Base) MCG/ACT inhaler INHALE 2 PUFFS BY MOUTH INTO THE LUNGS EVERY 6 HOURS AS NEEDED FOR WHEEZING OR SHORTNESS OF BREATH 54 g 5   No current facility-administered medications for this visit.    Allergies:  Patient has no known allergies.   Social History: The patient  reports that he has quit smoking. His smoking use included cigarettes. He started smoking about 17 years ago. He has a 10.00 pack-year smoking history. He has never used smokeless tobacco. He reports current alcohol use of about 42.0 standard drinks of alcohol per week. He reports that he does not use drugs.   ROS:  Please see the history of present illness. Otherwise, complete review of systems is positive for none.  All other systems are reviewed and negative.   Physical Exam: VS:  BP 113/74   Pulse 80   Temp (!) 97 F (36.1 C)   Ht 6\' 2"  (1.88 m)   Wt 254 lb (115.2 kg)   SpO2 97%   BMI 32.61 kg/m , BMI Body mass index  is 32.61 kg/m.  Wt Readings from Last 3 Encounters:  03/13/19 254 lb (115.2 kg)  11/21/18 256 lb (116.1 kg)  07/04/18 256 lb (116.1 kg)    General: Patient appears comfortable at rest. HEENT: Conjunctiva and lids normal, wearing a mask. Neck: Supple, no elevated JVP or carotid bruits, no thyromegaly. Lungs: Clear to auscultation, nonlabored breathing at rest. Cardiac: Regular rate and rhythm, no S3, 3/6 systolic murmur, no pericardial rub. Abdomen: Soft, nontender, bowel sounds present. Extremities: No pitting edema, distal pulses 2+. Skin: Warm and dry. Musculoskeletal: No kyphosis. Neuropsychiatric: Alert and oriented x3, affect grossly appropriate.  ECG:  An ECG dated 07/04/2018 was personally reviewed today and demonstrated:  Sinus rhythm with left atrial  enlargement.  Recent Labwork: 11/14/2018: ALT 35; AST 29; BUN 14; Creatinine, Ser 0.80; Hemoglobin 15.9; Platelets 211.0; Potassium 3.9; Sodium 139; TSH 1.68     Component Value Date/Time   CHOL 177 11/14/2018 0949   TRIG 81.0 11/14/2018 0949   TRIG 89 10/25/2008   HDL 59.50 11/14/2018 0949   CHOLHDL 3 11/14/2018 0949   VLDL 16.2 11/14/2018 0949   LDLCALC 102 (H) 11/14/2018 0949   LDLDIRECT 153.1 06/29/2012 0929    Other Studies Reviewed Today:  Echocardiogram 11/30/2018:  1. The left ventricle has normal systolic function, with an ejection fraction of 55-60%. The cavity size was normal. There is mildly increased left ventricular wall thickness. Left ventricular diastolic Doppler parameters are consistent with impaired  relaxation.  2. The right ventricle has normal systolic function. The cavity was normal. There is no increase in right ventricular wall thickness.  3. Left atrial size was mildly dilated.  4. No evidence of mitral valve stenosis.  5. The aortic valve has an indeterminate number of cusps. Severely thickening of the aortic valve. Severe calcifcation of the aortic valve. Aortic valve regurgitation is trivial by color flow Doppler. Moderate-severe stenosis of the aortic valve.  Moderate aortic annular calcification noted.  6. Moderate to severe aortic stenosis. Highest mean gradient was actually by pedhoff probe at 34 mmHg.  7. The aortic root is normal in size and structure.  8. Pulmonary hypertension is indeterminant, inadequate TR jet.  Assessment and Plan:  1.  Bicuspid aortic valve with moderate to severe aortic stenosis, mean gradient approximately 34 mmHg is stable.  We continue with observation at this point and have discussed further work-up over time.  Follow-up echocardiogram be obtained in January.  2.  History of PVCs and PACs, no reported palpitations or syncope.  3.  Mixed hyperlipidemia on Lipitor.  He continues to follow with PCP.  4.  Tobacco abuse in  remission.  5.  Minimal coronary atherosclerosis by cardiac catheterization in the past.  Medication Adjustments/Labs and Tests Ordered: Current medicines are reviewed at length with the patient today.  Concerns regarding medicines are outlined above.   Tests Ordered: Orders Placed This Encounter  Procedures  . ECHOCARDIOGRAM COMPLETE    Medication Changes: No orders of the defined types were placed in this encounter.   Disposition:  Follow up In January after echocardiogram.  Signed, Satira Sark, MD, [facility] 03/13/2019 2:11 PM    [facility] [place]. [address], [place], [place] [address] Phone: [phone]; Fax: [phone]

## 2019-03-15 ENCOUNTER — Encounter: Payer: Self-pay | Admitting: Internal Medicine

## 2019-03-16 ENCOUNTER — Other Ambulatory Visit: Payer: Self-pay | Admitting: Internal Medicine

## 2019-03-22 DIAGNOSIS — H524 Presbyopia: Secondary | ICD-10-CM | POA: Diagnosis not present

## 2019-04-06 DIAGNOSIS — M545 Low back pain: Secondary | ICD-10-CM | POA: Diagnosis not present

## 2019-05-15 ENCOUNTER — Other Ambulatory Visit: Payer: Self-pay

## 2019-05-15 MED ORDER — QUINAPRIL HCL 20 MG PO TABS: 20.0000 mg | ORAL_TABLET | Freq: Every day | ORAL | 1 refills | Status: DC

## 2019-05-28 ENCOUNTER — Other Ambulatory Visit: Payer: Self-pay | Admitting: Internal Medicine

## 2019-06-05 ENCOUNTER — Other Ambulatory Visit: Payer: Self-pay

## 2019-06-05 ENCOUNTER — Ambulatory Visit (INDEPENDENT_AMBULATORY_CARE_PROVIDER_SITE_OTHER): Payer: Medicare Other | Admitting: Cardiology

## 2019-06-05 ENCOUNTER — Encounter: Payer: Self-pay | Admitting: Cardiology

## 2019-06-05 ENCOUNTER — Ambulatory Visit (HOSPITAL_COMMUNITY)
Admission: RE | Admit: 2019-06-05 | Discharge: 2019-06-05 | Disposition: A | Discharge status: Home / Self Care | Payer: Medicare Other | Source: Ambulatory Visit | Attending: Cardiology | Admitting: Cardiology

## 2019-06-05 VITALS — BP 143/88 | HR 87 | Temp 97.8°F | Ht 73.5 in | Wt 258.0 lb

## 2019-06-05 DIAGNOSIS — I350 Nonrheumatic aortic (valve) stenosis: Secondary | ICD-10-CM

## 2019-06-05 DIAGNOSIS — E782 Mixed hyperlipidemia: Secondary | ICD-10-CM | POA: Diagnosis not present

## 2019-06-05 DIAGNOSIS — I10 Essential (primary) hypertension: Secondary | ICD-10-CM | POA: Diagnosis not present

## 2019-06-05 NOTE — Progress Notes (Signed)
*  PRELIMINARY RESULTS* Echocardiogram 2D Echocardiogram has been performed.  Danny Johnson 06/05/2019, 2:20 PM

## 2019-06-05 NOTE — Patient Instructions (Signed)
Medication Instructions:  Your physician recommends that you continue on your current medications as directed. Please refer to the Current Medication list given to you today.  *If you need a refill on your cardiac medications before your next appointment, please call your pharmacy*  Lab Work: none If you have labs (blood work) drawn today and your tests are completely normal, you will receive your results only by: Marland Kitchen MyChart Message (if you have MyChart) OR . A paper copy in the mail If you have any lab test that is abnormal or we need to change your treatment, we will call you to review the results.  Testing/Procedures: Your physician has requested that you have an echocardiogram IN 6 MONTHS. Echocardiography is a painless test that uses sound waves to create images of your heart. It provides your doctor with information about the size and shape of your heart and how well your heart's chambers and valves are working. This procedure takes approximately one hour. There are no restrictions for this procedure.     Follow-Up: At [facility], you and your health needs are our priority.  As part of our continuing mission to provide you with exceptional heart care, we have created designated Provider Care Teams.  These Care Teams include your primary Cardiologist (physician) and Advanced Practice Providers (APPs -  Physician Assistants and Nurse Practitioners) who all work together to provide you with the care you need, when you need it.  Your next appointment:   6 month(s)  The format for your next appointment:   In Person  Provider:   Dr.McDowell  Other Instructions NONE     Thank you for choosing [facility] !

## 2019-06-05 NOTE — Progress Notes (Signed)
Cardiology Office Note  Date: 06/05/2019   ID: WINTER BICKSLER, DOB [birth year], MRN [id number]  PCP:  Binnie Rail, MD  Cardiologist:  Rozann Lesches, MD Electrophysiologist:  None   Chief Complaint  Patient presents with  . Cardiac follow-up    History of Present Illness: Danny Johnson is a 69 y.o. male last seen in October 2020.  He presents for a routine follow-up visit.  He has not been as active over the last several months during the pandemic, not able to go to the [facility].  Still plays golf occasionally and works at [employer].  He does not describe any definite change in stamina, but has not had to push himself very much.  No obvious angina, no palpitations or syncope.  He had a follow-up echocardiogram today just prior to coming into the office, study has not yet been loaded for interpretation.  I let him know that I would read this and we would get back to him.  His last echocardiogram in July 2020 showed moderate to severe calcific aortic stenosis with mean gradient 34 mmHg.  He has a bicuspid aortic valve.  I went over his medications today which are outlined below.  Past Medical History:  Diagnosis Date  . Anxiety   . Aortic stenosis   . Arthritis   . Asthma   . Bicuspid aortic valve   . Coronary atherosclerosis of native coronary artery    Minimal, LVEF 60%  . Essential hypertension, benign   . Melanoma ([place]) 1993  . Mixed hyperlipidemia   . PVC's (premature ventricular contractions)     Past Surgical History:  Procedure Laterality Date  . HERNIA REPAIR  12/2017  . TONSILLECTOMY      Current Outpatient Medications  Medication Sig Dispense Refill  . ADVAIR DISKUS 250-50 MCG/DOSE AEPB INHALE 1 DOSE BY MOUTH TWICE DAILY 180 each 0  . aspirin EC 81 MG tablet Take 1 tablet (81 mg total) by mouth every other day. 90 tablet 3  . atorvastatin (LIPITOR) 10 MG tablet TAKE 1 TABLET BY MOUTH DAILY 90 tablet 1  . quinapril (ACCUPRIL) 20 MG tablet Take 1 tablet (20 mg  total) by mouth daily. 90 tablet 1  . sildenafil (REVATIO) 20 MG tablet TAKE 3 TO 5 TABLETS BY MOUTH AS NEEDED 30 tablet 0  . venlafaxine XR (EFFEXOR-XR) 37.5 MG 24 hr capsule TAKE 1 CAPSULE BY MOUTH ONCE DAILY WITH BREAKFAST 90 capsule 2  . VENTOLIN HFA 108 (90 Base) MCG/ACT inhaler INHALE 2 PUFFS BY MOUTH INTO THE LUNGS EVERY 6 HOURS AS NEEDED FOR WHEEZING OR SHORTNESS OF BREATH 54 g 5   No current facility-administered medications for this visit.   Allergies:  Patient has no known allergies.   Social History: The patient  reports that he has quit smoking. His smoking use included cigarettes. He started smoking about 17 years ago. He has a 10.00 pack-year smoking history. He has never used smokeless tobacco. He reports current alcohol use of about 42.0 standard drinks of alcohol per week. He reports that he does not use drugs.   ROS:  Please see the history of present illness. Otherwise, complete review of systems is positive for none.  All other systems are reviewed and negative.   Physical Exam: VS:  BP (!) 143/88   Pulse 87   Temp 97.8 F (36.6 C)   Ht 6' 1.5" (1.867 m)   Wt 258 lb (117 kg)   SpO2 98%   BMI  33.58 kg/m , BMI Body mass index is 33.58 kg/m.  Wt Readings from Last 3 Encounters:  06/05/19 258 lb (117 kg)  03/13/19 254 lb (115.2 kg)  11/21/18 256 lb (116.1 kg)    General: Patient appears comfortable at rest. HEENT: Conjunctiva and lids normal, wearing a mask. Neck: Supple, no elevated JVP or carotid bruits, no thyromegaly. Lungs: Clear to auscultation, nonlabored breathing at rest. Cardiac: Regular rate and rhythm, no S3, 3/6 systolic murmur. Abdomen: Soft, nontender, bowel sounds present. Extremities: No pitting edema, distal pulses 2+. Skin: Warm and dry. Musculoskeletal: No kyphosis. Neuropsychiatric: Alert and oriented x3, affect grossly appropriate.  ECG:  An ECG dated 07/04/2018 was personally reviewed today and demonstrated:  Sinus rhythm with left  atrial enlargement.  Recent Labwork: 11/14/2018: ALT 35; AST 29; BUN 14; Creatinine, Ser 0.80; Hemoglobin 15.9; Platelets 211.0; Potassium 3.9; Sodium 139; TSH 1.68     Component Value Date/Time   CHOL 177 11/14/2018 0949   TRIG 81.0 11/14/2018 0949   TRIG 89 10/25/2008 0000   HDL 59.50 11/14/2018 0949   CHOLHDL 3 11/14/2018 0949   VLDL 16.2 11/14/2018 0949   LDLCALC 102 (H) 11/14/2018 0949   LDLDIRECT 153.1 06/29/2012 0929    Other Studies Reviewed Today:  Echocardiogram 11/30/2018: 1. The left ventricle has normal systolic function, with an ejection fraction of 55-60%. The cavity size was normal. There is mildly increased left ventricular wall thickness. Left ventricular diastolic Doppler parameters are consistent with impaired  relaxation. 2. The right ventricle has normal systolic function. The cavity was normal. There is no increase in right ventricular wall thickness. 3. Left atrial size was mildly dilated. 4. No evidence of mitral valve stenosis. 5. The aortic valve has an indeterminate number of cusps. Severely thickening of the aortic valve. Severe calcifcation of the aortic valve. Aortic valve regurgitation is trivial by color flow Doppler. Moderate-severe stenosis of the aortic valve.  Moderate aortic annular calcification noted. 6. Moderate to severe aortic stenosis. Highest mean gradient was actually by pedhoff probe at 34 mmHg. 7. The aortic root is normal in size and structure. 8. Pulmonary hypertension is indeterminant, inadequate TR jet.  Assessment and Plan:  1.  Bicuspid aortic valve with moderate to severe calcific aortic stenosis.  Follow-up echocardiogram done earlier today, results pending.  I will review this study and we will make further recommendations regarding follow-up.  No obvious change in stamina or new exertional symptoms.  Last mean gradient was 34 mmHg.  2.  Mixed hyperlipidemia, continues on Lipitor.  3.  Minimal coronary atherosclerosis  by previous cardiac catheterization.  Medication Adjustments/Labs and Tests Ordered: Current medicines are reviewed at length with the patient today.  Concerns regarding medicines are outlined above.   Tests Ordered: Orders Placed This Encounter  Procedures  . ECHOCARDIOGRAM COMPLETE    Medication Changes: No orders of the defined types were placed in this encounter.   Disposition:  Follow up 6 months in the [place] office.  Signed, Satira Sark, MD, [facility] 06/05/2019 2:37 PM    [place] [facility] [address], [place], [place] [address] Phone: [phone]; Fax: [phone]

## 2019-06-15 ENCOUNTER — Ambulatory Visit: Payer: Medicare Other | Admitting: Cardiology

## 2019-08-23 ENCOUNTER — Other Ambulatory Visit: Payer: Self-pay

## 2019-08-23 MED ORDER — ATORVASTATIN CALCIUM 10 MG PO TABS: 10.0000 mg | ORAL_TABLET | Freq: Every day | ORAL | 0 refills | Status: DC

## 2019-10-02 ENCOUNTER — Encounter: Payer: Self-pay | Admitting: Internal Medicine

## 2019-10-02 DIAGNOSIS — H524 Presbyopia: Secondary | ICD-10-CM | POA: Diagnosis not present

## 2019-10-04 DIAGNOSIS — Z0120 Encounter for dental examination and cleaning without abnormal findings: Secondary | ICD-10-CM | POA: Diagnosis not present

## 2019-10-23 NOTE — Telephone Encounter (Signed)
Thank you for the update on how you are doing.  I saw your follow-up comments as well and will respond to both here.  I think for now I would continue to try to build yourself up gradually and let's plan on following up in the office after the echocardiogram in July to see if we need to consider further evaluation of aortic stenosis at this point.

## 2019-10-23 NOTE — Telephone Encounter (Signed)
Noted  

## 2019-11-13 ENCOUNTER — Other Ambulatory Visit: Payer: Self-pay | Admitting: Internal Medicine

## 2019-11-13 MED ORDER — ATORVASTATIN CALCIUM 10 MG PO TABS: 10.0000 mg | ORAL_TABLET | Freq: Every day | ORAL | 1 refills | Status: DC

## 2019-11-21 ENCOUNTER — Encounter: Payer: Self-pay | Admitting: Internal Medicine

## 2019-11-21 NOTE — Patient Instructions (Addendum)
Blood work was ordered.    All other Health Maintenance issues reviewed.   All recommended immunizations and age-appropriate screenings are up-to-date or discussed.  No immunization administered today.   Medications reviewed and updated.  Changes include :   none  Your prescription(s) have been submitted to your pharmacy. Please take as directed and contact our office if you believe you are having problem(s) with the medication(s).    Please followup in 6 months    Health Maintenance, Male Adopting a healthy lifestyle and getting preventive care are important in promoting health and wellness. Ask your health care provider about:  The right schedule for you to have regular tests and exams.  Things you can do on your own to prevent diseases and keep yourself healthy. What should I know about diet, weight, and exercise? Eat a healthy diet   Eat a diet that includes plenty of vegetables, fruits, low-fat dairy products, and lean protein.  Do not eat a lot of foods that are high in solid fats, added sugars, or sodium. Maintain a healthy weight Body mass index (BMI) is a measurement that can be used to identify possible weight problems. It estimates body fat based on height and weight. Your health care provider can help determine your BMI and help you achieve or maintain a healthy weight. Get regular exercise Get regular exercise. This is one of the most important things you can do for your health. Most adults should:  Exercise for at least 150 minutes each week. The exercise should increase your heart rate and make you sweat (moderate-intensity exercise).  Do strengthening exercises at least twice a week. This is in addition to the moderate-intensity exercise.  Spend less time sitting. Even light physical activity can be beneficial. Watch cholesterol and blood lipids Have your blood tested for lipids and cholesterol at 71 years of age, then have this test every 5 years. You may  need to have your cholesterol levels checked more often if:  Your lipid or cholesterol levels are high.  You are older than 71 years of age.  You are at high risk for heart disease. What should I know about cancer screening? Many types of cancers can be detected early and may often be prevented. Depending on your health history and family history, you may need to have cancer screening at various ages. This may include screening for:  Colorectal cancer.  Prostate cancer.  Skin cancer.  Lung cancer. What should I know about heart disease, diabetes, and high blood pressure? Blood pressure and heart disease  High blood pressure causes heart disease and increases the risk of stroke. This is more likely to develop in people who have high blood pressure readings, are of African descent, or are overweight.  Talk with your health care provider about your target blood pressure readings.  Have your blood pressure checked: ? Every 3-5 years if you are 18-39 years of age. ? Every year if you are 40 years old or older.  If you are between the ages of 65 and 75 and are a current or former smoker, ask your health care provider if you should have a one-time screening for abdominal aortic aneurysm (AAA). Diabetes Have regular diabetes screenings. This checks your fasting blood sugar level. Have the screening done:  Once every three years after age 45 if you are at a normal weight and have a low risk for diabetes.  More often and at a younger age if you are overweight or have a   a high risk for diabetes. What should I know about preventing infection? Hepatitis B If you have a higher risk for hepatitis B, you should be screened for this virus. Talk with your health care provider to find out if you are at risk for hepatitis B infection. Hepatitis C Blood testing is recommended for:  Everyone born from 1945 through 1965.  Anyone with known risk factors for hepatitis C. Sexually transmitted  infections (STIs)  You should be screened each year for STIs, including gonorrhea and chlamydia, if: ? You are sexually active and are younger than 71 years of age. ? You are older than 71 years of age and your health care provider tells you that you are at risk for this type of infection. ? Your sexual activity has changed since you were last screened, and you are at increased risk for chlamydia or gonorrhea. Ask your health care provider if you are at risk.  Ask your health care provider about whether you are at high risk for HIV. Your health care provider may recommend a prescription medicine to help prevent HIV infection. If you choose to take medicine to prevent HIV, you should first get tested for HIV. You should then be tested every 3 months for as long as you are taking the medicine. Follow these instructions at home: Lifestyle  Do not use any products that contain nicotine or tobacco, such as cigarettes, e-cigarettes, and chewing tobacco. If you need help quitting, ask your health care provider.  Do not use street drugs.  Do not share needles.  Ask your health care provider for help if you need support or information about quitting drugs. Alcohol use  Do not drink alcohol if your health care provider tells you not to drink.  If you drink alcohol: ? Limit how much you have to 0-2 drinks a day. ? Be aware of how much alcohol is in your drink. In the [place] one drink equals one 12 oz bottle of beer (355 mL), one 5 oz glass of wine (148 mL), or one 1 oz glass of hard liquor (44 mL). General instructions  Schedule regular health, dental, and eye exams.  Stay current with your vaccines.  Tell your health care provider if: ? You often feel depressed. ? You have ever been abused or do not feel safe at home. Summary  Adopting a healthy lifestyle and getting preventive care are important in promoting health and wellness.  Follow your health care provider's instructions about  healthy diet, exercising, and getting tested or screened for diseases.  Follow your health care provider's instructions on monitoring your cholesterol and blood pressure. This information is not intended to replace advice given to you by your health care provider. Make sure you discuss any questions you have with your health care provider. Document Revised: 05/03/2018 Document Reviewed: 05/03/2018 Elsevier Patient Education  2020 Elsevier Inc.  

## 2019-11-21 NOTE — Progress Notes (Signed)
Subjective:    Patient ID: Danny Johnson, male    DOB: [birth year], 69 y.o.   MRN: [id number]  HPI He is here for a physical exam.   Knot under left eye lid.  He did have an eye exam and his doctor did not say anything.  It does not hurt.    He has had increased stress - his children are having problems and that is stressing him out. He feels depressed and anxiety at times.  He takes the effexor.   Medications and allergies reviewed with patient and updated if appropriate.  Patient Active Problem List   Diagnosis Date Noted  . Loose stools 06/12/2018  . STD (sexually transmitted disease) 06/12/2018  . Herpes simplex 06/10/2018  . Obesity (BMI 30.0-34.9) 09/26/2017  . Umbilical hernia with obstruction but no gangrene 09/26/2017  . Right inguinal hernia 09/26/2017  . Dysfunction of left eustachian tube 09/26/2017  . Prediabetes 10/07/2015  . Coronary atherosclerosis of native coronary artery 07/09/2011  . ERECTILE DYSFUNCTION, ORGANIC 11/13/2009  . Allergic rhinitis 06/19/2009  . Aortic stenosis with bicuspid valve 11/05/2008  . Hyperlipidemia 03/18/2008  . Anxiety state 03/18/2008  . Essential hypertension 03/18/2008  . PVC's (premature ventricular contractions) 03/18/2008    Current Outpatient Medications on File Prior to Visit  Medication Sig Dispense Refill  . aspirin EC 81 MG tablet Take 1 tablet (81 mg total) by mouth every other day. 90 tablet 3  . atorvastatin (LIPITOR) 10 MG tablet Take 1 tablet (10 mg total) by mouth daily. 90 tablet 1  . quinapril (ACCUPRIL) 20 MG tablet Take 1 tablet (20 mg total) by mouth daily. 90 tablet 1  . sildenafil (REVATIO) 20 MG tablet TAKE 3 TO 5 TABLETS BY MOUTH AS NEEDED 30 tablet 0  . venlafaxine XR (EFFEXOR-XR) 37.5 MG 24 hr capsule TAKE 1 CAPSULE BY MOUTH ONCE DAILY WITH BREAKFAST 90 capsule 2  . ADVAIR DISKUS 250-50 MCG/DOSE AEPB INHALE 1 DOSE BY MOUTH TWICE DAILY (Patient not taking: Reported on 11/22/2019) 180 each 0  . VENTOLIN  HFA 108 (90 Base) MCG/ACT inhaler INHALE 2 PUFFS BY MOUTH INTO THE LUNGS EVERY 6 HOURS AS NEEDED FOR WHEEZING OR SHORTNESS OF BREATH (Patient not taking: Reported on 11/22/2019) 54 g 5   No current facility-administered medications on file prior to visit.    Past Medical History:  Diagnosis Date  . Anxiety   . Aortic stenosis   . Arthritis   . Asthma   . Bicuspid aortic valve   . Coronary atherosclerosis of native coronary artery    Minimal, LVEF 60%  . Essential hypertension, benign   . Melanoma ([place]) 1993  . Mixed hyperlipidemia   . PVC's (premature ventricular contractions)     Past Surgical History:  Procedure Laterality Date  . HERNIA REPAIR  12/2017  . TONSILLECTOMY      Social History   Socioeconomic History  . Marital status: Divorced    Spouse name: Not on file  . Number of children: Not on file  . Years of education: Not on file  . Highest education level: Not on file  Occupational History  . Not on file  Tobacco Use  . Smoking status: Former Smoker    Packs/day: 1.00    Years: 10.00    Pack years: 10.00    Types: Cigarettes    Start date: 07/21/2001  . Smokeless tobacco: Never Used  . Tobacco comment: quit 2019  Vaping Use  . Vaping Use: Never  used  Substance and Sexual Activity  . Alcohol use: Yes    Alcohol/week: 42.0 standard drinks    Types: 42 Standard drinks or equivalent per week    Comment: 4-6 drinks daily, beer/wine  . Drug use: No  . Sexual activity: Not on file  Other Topics Concern  . Not on file  Social History Narrative  . Not on file   Social Determinants of Health   Financial Resource Strain:   . Difficulty of Paying Living Expenses:   Food Insecurity:   . Worried About Charity fundraiser in the Last Year:   . Arboriculturist in the Last Year:   Transportation Needs:   . Film/video editor (Medical):   Marland Kitchen Lack of Transportation (Non-Medical):   Physical Activity:   . Days of Exercise per Week:   . Minutes of Exercise  per Session:   Stress:   . Feeling of Stress :   Social Connections:   . Frequency of Communication with Friends and Family:   . Frequency of Social Gatherings with Friends and Family:   . Attends Religious Services:   . Active Member of Clubs or Organizations:   . Attends Archivist Meetings:   Marland Kitchen Marital Status:     Family History  Problem Relation Age of Onset  . Dementia Mother     Review of Systems  Constitutional: Negative for chills, fatigue and fever.  Eyes: Negative for visual disturbance.  Respiratory: Negative for cough, shortness of breath and wheezing.   Cardiovascular: Positive for palpitations (occ). Negative for chest pain.  Gastrointestinal: Positive for diarrhea (stress and food induced). Negative for abdominal pain, blood in stool, constipation and nausea.       No gerd  Genitourinary: Negative for difficulty urinating, dysuria and hematuria.  Musculoskeletal: Negative for arthralgias and back pain.  Skin: Negative for color change and rash.  Neurological: Negative for dizziness and headaches.  Psychiatric/Behavioral: Positive for dysphoric mood. The patient is nervous/anxious.        Objective:   Vitals:   11/22/19 0957  BP: 130/80  Pulse: 74  Temp: 98.3 F (36.8 C)  SpO2: 95%   Filed Weights   11/22/19 0957  Weight: 248 lb 3.2 oz (112.6 kg)   Body mass index is 32.3 kg/m.  BP Readings from Last 3 Encounters:  11/22/19 130/80  06/05/19 (!) 143/88  03/13/19 113/74    Wt Readings from Last 3 Encounters:  11/22/19 248 lb 3.2 oz (112.6 kg)  06/05/19 258 lb (117 kg)  03/13/19 254 lb (115.2 kg)     Physical Exam Constitutional: He appears well-developed and well-nourished. No distress.  HENT:  Head: Normocephalic and atraumatic.  Right Ear: External ear normal.  Left Ear: External ear normal.  Mouth/Throat: Oropharynx is clear and moist.  Normal ear canals and TM b/l  Eyes: Conjunctivae and EOM are normal.  Neck: Neck  supple. No tracheal deviation present. No thyromegaly present.  No carotid bruit  Cardiovascular: Normal rate, regular rhythm, normal heart sounds and intact distal pulses.   2/6 systolic murmur heard. Pulmonary/Chest: Effort normal and breath sounds normal. No respiratory distress. He has no wheezes. He has no rales.  Abdominal: Soft. Ventral hernia - non tender. He exhibits no distension. There is no tenderness.  Genitourinary: deferred  Musculoskeletal: He exhibits no edema.  Lymphadenopathy:   He has no cervical adenopathy.  Skin: Skin is warm and dry. He is not diaphoretic.  Psychiatric: He has a  normal mood and affect. His behavior is normal.         Assessment & Plan:   Physical exam: Screening blood work  ordered Immunizations  Had covid, had shingrix Colonoscopy   Up to date  Eye exams   Up to date Exercise   Golf, walks 2-2.5 miles many days Weight  Obese - Working on weight loss Substance abuse   ? etoh use  See Problem List for Assessment and Plan of chronic medical problems.   This visit occurred during the SARS-CoV-2 public health emergency.  Safety protocols were in place, including screening questions prior to the visit, additional usage of staff PPE, and extensive cleaning of exam room while observing appropriate contact time as indicated for disinfecting solutions.

## 2019-11-22 ENCOUNTER — Ambulatory Visit (INDEPENDENT_AMBULATORY_CARE_PROVIDER_SITE_OTHER): Payer: Medicare Other | Admitting: Internal Medicine

## 2019-11-22 ENCOUNTER — Encounter: Payer: Self-pay | Admitting: Internal Medicine

## 2019-11-22 ENCOUNTER — Other Ambulatory Visit: Payer: Self-pay

## 2019-11-22 VITALS — BP 130/80 | HR 74 | Temp 98.3°F | Ht 73.5 in | Wt 248.2 lb

## 2019-11-22 DIAGNOSIS — F411 Generalized anxiety disorder: Secondary | ICD-10-CM

## 2019-11-22 DIAGNOSIS — K439 Ventral hernia without obstruction or gangrene: Secondary | ICD-10-CM

## 2019-11-22 DIAGNOSIS — Q2381 Bicuspid aortic valve: Secondary | ICD-10-CM

## 2019-11-22 DIAGNOSIS — I10 Essential (primary) hypertension: Secondary | ICD-10-CM

## 2019-11-22 DIAGNOSIS — E66811 Obesity, class 1: Secondary | ICD-10-CM

## 2019-11-22 DIAGNOSIS — Z125 Encounter for screening for malignant neoplasm of prostate: Secondary | ICD-10-CM | POA: Diagnosis not present

## 2019-11-22 DIAGNOSIS — Z0000 Encounter for general adult medical examination without abnormal findings: Secondary | ICD-10-CM | POA: Diagnosis not present

## 2019-11-22 DIAGNOSIS — E782 Mixed hyperlipidemia: Secondary | ICD-10-CM

## 2019-11-22 DIAGNOSIS — I2510 Atherosclerotic heart disease of native coronary artery without angina pectoris: Secondary | ICD-10-CM

## 2019-11-22 DIAGNOSIS — R7303 Prediabetes: Secondary | ICD-10-CM

## 2019-11-22 DIAGNOSIS — Q230 Congenital stenosis of aortic valve: Secondary | ICD-10-CM

## 2019-11-22 DIAGNOSIS — Q231 Congenital insufficiency of aortic valve: Secondary | ICD-10-CM

## 2019-11-22 DIAGNOSIS — E669 Obesity, unspecified: Secondary | ICD-10-CM

## 2019-11-22 LAB — CBC WITH DIFFERENTIAL/PLATELET
Basophils Absolute: 0.1 10*3/uL (ref 0.0–0.1)
Basophils Relative: 1.3 % (ref 0.0–3.0)
Eosinophils Absolute: 0.4 10*3/uL (ref 0.0–0.7)
Eosinophils Relative: 5.6 % — ABNORMAL HIGH (ref 0.0–5.0)
HCT: 45.1 % (ref 39.0–52.0)
Hemoglobin: 15.8 g/dL (ref 13.0–17.0)
Lymphocytes Relative: 21.1 % (ref 12.0–46.0)
Lymphs Abs: 1.4 10*3/uL (ref 0.7–4.0)
MCHC: 35.1 g/dL (ref 30.0–36.0)
MCV: 100.9 fl — ABNORMAL HIGH (ref 78.0–100.0)
Monocytes Absolute: 0.7 10*3/uL (ref 0.1–1.0)
Monocytes Relative: 10.2 % (ref 3.0–12.0)
Neutro Abs: 4.1 10*3/uL (ref 1.4–7.7)
Neutrophils Relative %: 61.8 % (ref 43.0–77.0)
Platelets: 236 10*3/uL (ref 150.0–400.0)
RBC: 4.47 Mil/uL (ref 4.22–5.81)
RDW: 12.8 % (ref 11.5–15.5)
WBC: 6.7 10*3/uL (ref 4.0–10.5)

## 2019-11-22 LAB — HEMOGLOBIN A1C: Hgb A1c MFr Bld: 5.7 % (ref 4.6–6.5)

## 2019-11-22 LAB — COMPREHENSIVE METABOLIC PANEL
ALT: 27 U/L (ref 0–53)
AST: 23 U/L (ref 0–37)
Albumin: 4.5 g/dL (ref 3.5–5.2)
Alkaline Phosphatase: 80 U/L (ref 39–117)
BUN: 12 mg/dL (ref 6–23)
CO2: 25 mEq/L (ref 19–32)
Calcium: 9.6 mg/dL (ref 8.4–10.5)
Chloride: 101 mEq/L (ref 96–112)
Creatinine, Ser: 0.84 mg/dL (ref 0.40–1.50)
GFR: 90.11 mL/min (ref >60.00)
Glucose, Bld: 114 mg/dL — ABNORMAL HIGH (ref 70–99)
Potassium: 4.2 mEq/L (ref 3.5–5.1)
Sodium: 135 mEq/L (ref 135–145)
Total Bilirubin: 0.7 mg/dL (ref 0.2–1.2)
Total Protein: 6.8 g/dL (ref 6.0–8.3)

## 2019-11-22 LAB — LIPID PANEL
Cholesterol: 163 mg/dL (ref 0–200)
HDL: 60 mg/dL (ref >39.00)
LDL Cholesterol: 84 mg/dL (ref 0–99)
NonHDL: 103.4
Total CHOL/HDL Ratio: 3
Triglycerides: 97 mg/dL (ref 0.0–149.0)
VLDL: 19.4 mg/dL (ref 0.0–40.0)

## 2019-11-22 LAB — PSA, MEDICARE: PSA: 0.28 ng/ml (ref 0.10–4.00)

## 2019-11-22 LAB — TSH: TSH: 1.86 u[IU]/mL (ref 0.35–4.50)

## 2019-11-22 MED ORDER — VENLAFAXINE HCL ER 37.5 MG PO CP24: ORAL_CAPSULE | ORAL | 3 refills | Status: DC

## 2019-11-22 MED ORDER — QUINAPRIL HCL 20 MG PO TABS: 20.0000 mg | ORAL_TABLET | Freq: Every day | ORAL | 3 refills | Status: DC

## 2019-11-22 MED ORDER — ATORVASTATIN CALCIUM 10 MG PO TABS: 10.0000 mg | ORAL_TABLET | Freq: Every day | ORAL | 3 refills | Status: DC

## 2019-11-22 NOTE — Assessment & Plan Note (Signed)
Chronic Asymptomatic monitor

## 2019-11-22 NOTE — Assessment & Plan Note (Signed)
Chronic Check lipid panel  Continue daily statin Regular exercise and healthy diet encouraged  

## 2019-11-22 NOTE — Assessment & Plan Note (Signed)
Chronic Increased but fairly controlled He does not want to increase his medication at this time Continue current dose of medication

## 2019-11-22 NOTE — Assessment & Plan Note (Signed)
Chronic Following with cardiology Asymptomatic Has echo this month

## 2019-11-22 NOTE — Assessment & Plan Note (Signed)
Chronic BP well controlled Current regimen effective and well tolerated Continue current medications at current doses cmp  

## 2019-11-22 NOTE — Assessment & Plan Note (Signed)
Chronic Follows with cardiology Continue ASA, statin Check labs No concerning symptoms of angina

## 2019-11-22 NOTE — Assessment & Plan Note (Signed)
Chronic Continue regular exercise Working on weight loss and has lost weight - continue efforts

## 2019-11-22 NOTE — Assessment & Plan Note (Signed)
Chronic Check a1c Low sugar / carb diet Stressed regular exercise  

## 2019-12-03 ENCOUNTER — Other Ambulatory Visit: Payer: Self-pay

## 2019-12-03 ENCOUNTER — Ambulatory Visit (HOSPITAL_COMMUNITY)
Admission: RE | Admit: 2019-12-03 | Discharge: 2019-12-03 | Disposition: A | Discharge status: Home / Self Care | Payer: Medicare Other | Source: Ambulatory Visit | Attending: Cardiology | Admitting: Cardiology

## 2019-12-03 DIAGNOSIS — I350 Nonrheumatic aortic (valve) stenosis: Secondary | ICD-10-CM

## 2019-12-03 NOTE — Progress Notes (Signed)
*  PRELIMINARY RESULTS* Echocardiogram 2D Echocardiogram has been performed.  Danny Johnson 12/03/2019, 12:45 PM

## 2019-12-04 ENCOUNTER — Encounter: Payer: Self-pay | Admitting: Internal Medicine

## 2019-12-05 MED ORDER — VENLAFAXINE HCL ER 75 MG PO CP24
75.0000 mg | ORAL_CAPSULE | Freq: Every day | ORAL | 1 refills | Status: DC
Start: 2019-12-05 — End: 2020-06-23

## 2019-12-06 DIAGNOSIS — D1801 Hemangioma of skin and subcutaneous tissue: Secondary | ICD-10-CM | POA: Diagnosis not present

## 2019-12-06 DIAGNOSIS — Z85820 Personal history of malignant melanoma of skin: Secondary | ICD-10-CM | POA: Diagnosis not present

## 2019-12-06 DIAGNOSIS — Z808 Family history of malignant neoplasm of other organs or systems: Secondary | ICD-10-CM | POA: Diagnosis not present

## 2019-12-06 DIAGNOSIS — L814 Other melanin hyperpigmentation: Secondary | ICD-10-CM | POA: Diagnosis not present

## 2019-12-18 ENCOUNTER — Encounter: Payer: Self-pay | Admitting: Cardiology

## 2019-12-18 ENCOUNTER — Ambulatory Visit: Payer: Medicare Other | Admitting: Cardiology

## 2019-12-18 ENCOUNTER — Other Ambulatory Visit: Payer: Self-pay

## 2019-12-18 VITALS — BP 122/74 | HR 98 | Ht 73.5 in | Wt 248.0 lb

## 2019-12-18 DIAGNOSIS — I350 Nonrheumatic aortic (valve) stenosis: Secondary | ICD-10-CM | POA: Diagnosis not present

## 2019-12-18 DIAGNOSIS — E782 Mixed hyperlipidemia: Secondary | ICD-10-CM

## 2019-12-18 NOTE — Progress Notes (Signed)
Cardiology Office Note  Date: 12/18/2019   ID: Danny Johnson, DOB 1950/11/05, MRN [id number]  PCP:  Binnie Rail, MD  Cardiologist:  Rozann Lesches, MD Electrophysiologist:  None   Chief Complaint  Patient presents with  . Cardiac follow-up    History of Present Illness: Danny Johnson is a 69 y.o. male last seen in January.  He presents for a routine visit.  Still plays golf a few days a week at [facility], has had a lot of trouble with a high heat and humidity, sweats a lot but does hydrate well per discussion.  No definitive change in overall stamina.  No palpitations or syncope.  Recent follow-up echocardiogram showed LVEF 55 to 60%, moderate LVH, moderate to severe aortic stenosis with mean gradient 30 mmHg, dimensionless index 0.18.  Overall relatively stable findings.  I reviewed his medications which are outlined below.  I personally reviewed his ECG which shows sinus rhythm with PVCs, poor R wave progression.  Past Medical History:  Diagnosis Date  . Anxiety   . Aortic stenosis   . Arthritis   . Asthma   . Bicuspid aortic valve   . Coronary atherosclerosis of native coronary artery    Minimal, LVEF 60%  . Essential hypertension, benign   . Melanoma ([place]) 1993  . Mixed hyperlipidemia   . PVC's (premature ventricular contractions)     Past Surgical History:  Procedure Laterality Date  . HERNIA REPAIR  12/2017  . TONSILLECTOMY      Current Outpatient Medications  Medication Sig Dispense Refill  . aspirin EC 81 MG tablet Take 1 tablet (81 mg total) by mouth every other day. 90 tablet 3  . atorvastatin (LIPITOR) 10 MG tablet Take 1 tablet (10 mg total) by mouth daily. 90 tablet 3  . quinapril (ACCUPRIL) 20 MG tablet Take 1 tablet (20 mg total) by mouth daily. 90 tablet 3  . sildenafil (REVATIO) 20 MG tablet TAKE 3 TO 5 TABLETS BY MOUTH AS NEEDED 30 tablet 0  . venlafaxine XR (EFFEXOR XR) 75 MG 24 hr capsule Take 1 capsule (75 mg total) by mouth daily with  breakfast. 90 capsule 1  . VENTOLIN HFA 108 (90 Base) MCG/ACT inhaler INHALE 2 PUFFS BY MOUTH INTO THE LUNGS EVERY 6 HOURS AS NEEDED FOR WHEEZING OR SHORTNESS OF BREATH 54 g 5   No current facility-administered medications for this visit.   Allergies:  Patient has no known allergies.   ROS:   No syncope.  Physical Exam: VS:  BP 122/74   Pulse 98   Ht 6' 1.5" (1.867 m)   Wt (!) 248 lb (112.5 kg)   SpO2 96%   BMI 32.28 kg/m , BMI Body mass index is 32.28 kg/m.  Wt Readings from Last 3 Encounters:  12/18/19 (!) 248 lb (112.5 kg)  11/22/19 248 lb 3.2 oz (112.6 kg)  06/05/19 258 lb (117 kg)    General: Patient appears comfortable at rest. HEENT: Conjunctiva and lids normal, wearing a mask. Neck: Supple, no elevated JVP or carotid bruits, no thyromegaly. Lungs: Clear to auscultation, nonlabored breathing at rest. Cardiac: Regular rate and rhythm, no S3, 3/6 systolic murmur, no pericardial rub. Extremities: No pitting edema, distal pulses 2+.  ECG:  An ECG dated 07/04/2018 was personally reviewed today and demonstrated:  Sinus rhythm with left atrial enlargement.  Recent Labwork: 11/22/2019: ALT 27; AST 23; BUN 12; Creatinine, Ser 0.84; Hemoglobin 15.8; Platelets 236.0; Potassium 4.2; Sodium 135; TSH 1.86  Component Value Date/Time   CHOL 163 11/22/2019 1028   TRIG 97.0 11/22/2019 1028   TRIG 89 10/25/2008 0000   HDL 60.00 11/22/2019 1028   CHOLHDL 3 11/22/2019 1028   VLDL 19.4 11/22/2019 1028   LDLCALC 84 11/22/2019 1028   LDLDIRECT 153.1 06/29/2012 0929    Other Studies Reviewed Today:  Echocardiogram 12/03/2019: 1. Left ventricular ejection fraction, by estimation, is 55 to 60%. The  left ventricle has normal function. The left ventricle has no regional  wall motion abnormalities. There is moderate left ventricular hypertrophy.  Left ventricular diastolic  parameters are indeterminate.  2. Right ventricular systolic function is normal. The right ventricular  size  is normal.  3. The mitral valve is normal in structure. No evidence of mitral valve  regurgitation. No evidence of mitral stenosis.  4. Technically difficult study of the aortic valve with limited Doppler  imaging leading to potentially lower calculated aortic valve area. By  gradient alone aortic valve is moderate. Overall would classify as  moderate to severe AS. Marland Kitchen The aortic valve is  tricuspid. Aortic valve regurgitation is not visualized. Moderate aortic  valve stenosis.Aortic valve mean gradient measures 30.3 mmHg. Aortic valve  peak gradient measures 49.3 mmHg. Aortic valve area, by VTI measures 0.64  cm.  5. The inferior vena cava is normal in size with greater than 50%  respiratory variability, suggesting right atrial pressure of 3 mmHg.   Assessment and Plan:  1.  Moderate to severe calcific aortic stenosis in the setting of bicuspid aortic valve.  Recent echocardiogram noted above, overall stable findings.  Continue observation with reevaluation in the next 6 months.  2.  Mixed hyperlipidemia, he remains on Lipitor.  Last LDL 84.  Medication Adjustments/Labs and Tests Ordered: Current medicines are reviewed at length with the patient today.  Concerns regarding medicines are outlined above.   Tests Ordered: Orders Placed This Encounter  Procedures  . EKG 12-Lead  . ECHOCARDIOGRAM COMPLETE    Medication Changes: No orders of the defined types were placed in this encounter.   Disposition:  Follow up 6 months in the [place] office.  Signed, Satira Sark, MD, [facility] 12/18/2019 3:17 PM    [facility] [address], [place], [place] [address] Phone: [phone]; Fax: [phone]

## 2019-12-18 NOTE — Patient Instructions (Signed)
Medication Instructions:  Your physician recommends that you continue on your current medications as directed. Please refer to the Current Medication list given to you today.  *If you need a refill on your cardiac medications before your next appointment, please call your pharmacy*   Lab Work: None today If you have labs (blood work) drawn today and your tests are completely normal, you will receive your results only by: Marland Kitchen MyChart Message (if you have MyChart) OR . A paper copy in the mail If you have any lab test that is abnormal or we need to change your treatment, we will call you to review the results.   Testing/Procedures: Your physician has requested that you have an echocardiogram IN 6 MONTHS. Echocardiography is a painless test that uses sound waves to create images of your heart. It provides your doctor with information about the size and shape of your heart and how well your heart's chambers and valves are working. This procedure takes approximately one hour. There are no restrictions for this procedure.     Follow-Up: At [facility], you and your health needs are our priority.  As part of our continuing mission to provide you with exceptional heart care, we have created designated Provider Care Teams.  These Care Teams include your primary Cardiologist (physician) and Advanced Practice Providers (APPs -  Physician Assistants and Nurse Practitioners) who all work together to provide you with the care you need, when you need it.  We recommend signing up for the patient portal called "MyChart".  Sign up information is provided on this After Visit Summary.  MyChart is used to connect with patients for Virtual Visits (Telemedicine).  Patients are able to view lab/test results, encounter notes, upcoming appointments, etc.  Non-urgent messages can be sent to your provider as well.   To learn more about what you can do with MyChart, go to [email or web address].    Your next  appointment:   6 month(s)  The format for your next appointment:   In Person  Provider:   Rozann Lesches, MD   Other Instructions None       Thank you for choosing [facility] !

## 2019-12-24 DIAGNOSIS — R0683 Snoring: Secondary | ICD-10-CM | POA: Diagnosis not present

## 2019-12-24 DIAGNOSIS — G4710 Hypersomnia, unspecified: Secondary | ICD-10-CM | POA: Diagnosis not present

## 2020-01-09 ENCOUNTER — Encounter: Payer: Self-pay | Admitting: Internal Medicine

## 2020-01-15 DIAGNOSIS — G4733 Obstructive sleep apnea (adult) (pediatric): Secondary | ICD-10-CM | POA: Diagnosis not present

## 2020-01-15 DIAGNOSIS — R0683 Snoring: Secondary | ICD-10-CM | POA: Diagnosis not present

## 2020-01-15 DIAGNOSIS — G4710 Hypersomnia, unspecified: Secondary | ICD-10-CM | POA: Diagnosis not present

## 2020-01-18 DIAGNOSIS — Z20822 Contact with and (suspected) exposure to covid-19: Secondary | ICD-10-CM | POA: Diagnosis not present

## 2020-01-19 ENCOUNTER — Encounter: Payer: Self-pay | Admitting: Internal Medicine

## 2020-01-19 MED ORDER — ALBUTEROL SULFATE HFA 108 (90 BASE) MCG/ACT IN AERS: INHALATION_SPRAY | RESPIRATORY_TRACT | 5 refills | Status: DC

## 2020-01-24 DIAGNOSIS — G4733 Obstructive sleep apnea (adult) (pediatric): Secondary | ICD-10-CM | POA: Diagnosis not present

## 2020-02-26 ENCOUNTER — Other Ambulatory Visit: Payer: Self-pay | Admitting: Internal Medicine

## 2020-04-11 DIAGNOSIS — G4733 Obstructive sleep apnea (adult) (pediatric): Secondary | ICD-10-CM | POA: Diagnosis not present

## 2020-05-31 ENCOUNTER — Other Ambulatory Visit: Payer: Self-pay | Admitting: Internal Medicine

## 2020-06-10 ENCOUNTER — Other Ambulatory Visit: Payer: Self-pay | Admitting: Internal Medicine

## 2020-06-16 ENCOUNTER — Other Ambulatory Visit: Payer: Self-pay

## 2020-06-16 ENCOUNTER — Ambulatory Visit (HOSPITAL_COMMUNITY)
Admission: RE | Admit: 2020-06-16 | Discharge: 2020-06-16 | Disposition: A | Discharge status: Home / Self Care | Payer: Medicare Other | Source: Ambulatory Visit | Attending: Cardiology | Admitting: Cardiology

## 2020-06-16 DIAGNOSIS — I350 Nonrheumatic aortic (valve) stenosis: Secondary | ICD-10-CM | POA: Diagnosis not present

## 2020-06-16 LAB — ECHOCARDIOGRAM COMPLETE
AR max vel: 0.79 cm2
AV Area VTI: 0.81 cm2
AV Area mean vel: 0.78 cm2
AV Mean grad: 31.9 mmHg
AV Peak grad: 52.1 mmHg
Ao pk vel: 3.61 m/s
Area-P 1/2: 5.27 cm2
P 1/2 time: 590 msec
S' Lateral: 3.42 cm

## 2020-06-16 NOTE — Progress Notes (Signed)
*  PRELIMINARY RESULTS* Echocardiogram 2D Echocardiogram has been performed.  Leavy Cella 06/16/2020, 1:51 PM

## 2020-06-23 ENCOUNTER — Encounter: Payer: Self-pay | Admitting: Cardiology

## 2020-06-23 ENCOUNTER — Ambulatory Visit: Payer: Medicare Other | Admitting: Cardiology

## 2020-06-23 ENCOUNTER — Other Ambulatory Visit: Payer: Self-pay

## 2020-06-23 VITALS — BP 138/88 | HR 88 | Ht 73.5 in | Wt 252.0 lb

## 2020-06-23 DIAGNOSIS — I350 Nonrheumatic aortic (valve) stenosis: Secondary | ICD-10-CM | POA: Diagnosis not present

## 2020-06-23 DIAGNOSIS — E782 Mixed hyperlipidemia: Secondary | ICD-10-CM

## 2020-06-23 NOTE — Progress Notes (Signed)
Cardiology Office Note  Date: 06/23/2020   ID: Danny Johnson, DOB [birth year], MRN [id number]  PCP:  Binnie Rail, MD  Cardiologist:  Rozann Lesches, MD Electrophysiologist:  None   Chief Complaint  Patient presents with  . Cardiac follow-up    History of Present Illness: Danny Johnson is a 70 y.o. male last seen in July 2021.  He presents for a routine visit.  Reports no angina symptoms or major change in stamina, although has not been as active recently.  He plans to play golf this week if the weather will allow.  No palpitations, dizziness, or syncope.  He tells me that he walks 2 miles recently and did not experience limiting exertional symptoms at steady pace.  Recent follow-up echocardiogram revealed LVEF 55 to 60% with moderate LVH, moderately dilated left atrium, and moderate to severe aortic stenosis (upper end of scale) with mean gradient 32 mmHg and dimensionless index 0.21.  We discussed these results today.  There has been a mild increase in mean gradient since last study.  I reviewed his medications which are outlined below.   Past Medical History:  Diagnosis Date  . Anxiety   . Aortic stenosis   . Arthritis   . Asthma   . Bicuspid aortic valve   . Coronary atherosclerosis of native coronary artery    Minimal, LVEF 60%  . Essential hypertension, benign   . Melanoma ([place]) 1993  . Mixed hyperlipidemia   . PVC's (premature ventricular contractions)     Past Surgical History:  Procedure Laterality Date  . HERNIA REPAIR  12/2017  . TONSILLECTOMY      Current Outpatient Medications  Medication Sig Dispense Refill  . albuterol (VENTOLIN HFA) 108 (90 Base) MCG/ACT inhaler INHALE 2 PUFFS BY MOUTH INTO THE LUNGS EVERY 6 HOURS AS NEEDED FOR WHEEZING OR SHORTNESS OF BREATH 18 g 5  . aspirin EC 81 MG tablet Take 1 tablet (81 mg total) by mouth every other day. 90 tablet 3  . atorvastatin (LIPITOR) 10 MG tablet Take 1 tablet (10 mg total) by mouth daily. 90 tablet  3  . quinapril (ACCUPRIL) 20 MG tablet Take 1 tablet (20 mg total) by mouth daily. 90 tablet 3  . sildenafil (REVATIO) 20 MG tablet TAKE 3-5 TABLETS BY MOUTH DAILY AS NEEDED 30 tablet 0  . venlafaxine XR (EFFEXOR-XR) 37.5 MG 24 hr capsule Take 37.5 mg by mouth every morning.     No current facility-administered medications for this visit.   Allergies:  Patient has no known allergies.   ROS: No syncope.  Physical Exam: VS:  BP 138/88   Pulse 88   Ht 6' 1.5" (1.867 m)   Wt 252 lb (114.3 kg)   SpO2 96%   BMI 32.80 kg/m , BMI Body mass index is 32.8 kg/m.  Wt Readings from Last 3 Encounters:  06/23/20 252 lb (114.3 kg)  12/18/19 (!) 248 lb (112.5 kg)  11/22/19 248 lb 3.2 oz (112.6 kg)    General: Patient appears comfortable at rest. HEENT: Conjunctiva and lids normal, wearing a mask. Neck: Supple, no elevated JVP or carotid bruits, no thyromegaly. Lungs: Clear to auscultation, nonlabored breathing at rest. Cardiac: Regular rate and rhythm, no S3, 5-1/7 systolic murmur, no pericardial rub. Extremities: No pitting edema.  ECG:  An ECG dated 12/18/2019 was personally reviewed today and demonstrated:  Sinus rhythm with PVCs, poor R wave progression.  Recent Labwork: 11/22/2019: ALT 27; AST 23; BUN 12; Creatinine, Ser  0.84; Hemoglobin 15.8; Platelets 236.0; Potassium 4.2; Sodium 135; TSH 1.86     Component Value Date/Time   CHOL 163 11/22/2019 1028   TRIG 97.0 11/22/2019 1028   TRIG 89 10/25/2008 0000   HDL 60.00 11/22/2019 1028   CHOLHDL 3 11/22/2019 1028   VLDL 19.4 11/22/2019 1028   LDLCALC 84 11/22/2019 1028   LDLDIRECT 153.1 06/29/2012 0929    Other Studies Reviewed Today:  Echocardiogram 06/16/2020: 1. Left ventricular ejection fraction, by estimation, is 55 to 60%. The  left ventricle has normal function. The left ventricle has no regional  wall motion abnormalities. There is moderate left ventricular hypertrophy.  Left ventricular diastolic  parameters are  indeterminate.  2. Right ventricular systolic function is normal. The right ventricular  size is normal.  3. Left atrial size was moderately dilated.  4. The mitral valve is normal in structure. No evidence of mitral valve  regurgitation. No evidence of mitral stenosis.  5. The aortic valve is tricuspid. There is moderate calcification of the  aortic valve. There is moderate thickening of the aortic valve. Aortic  valve regurgitation is mild. Moderate to severe aortic valve stenosis.  Aortic valve mean gradient measures  31.9 mmHg. Aortic valve peak gradient measures 52.1 mmHg. Aortic valve  area, by VTI measures 0.81 cm.  6. The inferior vena cava is normal in size with greater than 50%  respiratory variability, suggesting right atrial pressure of 3 mmHg.   Assessment and Plan:  1.  Moderate to severe calcific aortic stenosis, upper end of scale with mean gradient 32 mmHg and dimensionless index 0.21.  He reports no definitive change in stamina as discussed above, no exertional angina, palpitations, or syncope.  We will continue with observation for now and have discussed referral to the valve clinic as a next step.  Follow-up echocardiogram just prior to next visit.  2.  Mixed hyperlipidemia, tolerating Lipitor.  Medication Adjustments/Labs and Tests Ordered: Current medicines are reviewed at length with the patient today.  Concerns regarding medicines are outlined above.   Tests Ordered: Orders Placed This Encounter  Procedures  . ECHOCARDIOGRAM COMPLETE    Medication Changes: No orders of the defined types were placed in this encounter.   Disposition:  Follow up 6 months in the [place] office.  Signed, Satira Sark, MD, [facility] 06/23/2020 2:20 PM    [facility] at [facility] [address], [place], [place] [address] Phone: [phone]; Fax: [phone]

## 2020-06-23 NOTE — Patient Instructions (Signed)
Medication Instructions:  Your physician recommends that you continue on your current medications as directed. Please refer to the Current Medication list given to you today.  *If you need a refill on your cardiac medications before your next appointment, please call your pharmacy*   Lab Work: None today If you have labs (blood work) drawn today and your tests are completely normal, you will receive your results only by: Marland Kitchen MyChart Message (if you have MyChart) OR . A paper copy in the mail If you have any lab test that is abnormal or we need to change your treatment, we will call you to review the results.   Testing/Procedures: Your physician has requested that you have an echocardiogram IN 6 MONTHS. Echocardiography is a painless test that uses sound waves to create images of your heart. It provides your doctor with information about the size and shape of your heart and how well your heart's chambers and valves are working. This procedure takes approximately one hour. There are no restrictions for this procedure.     Follow-Up: At [facility], you and your health needs are our priority.  As part of our continuing mission to provide you with exceptional heart care, we have created designated Provider Care Teams.  These Care Teams include your primary Cardiologist (physician) and Advanced Practice Providers (APPs -  Physician Assistants and Nurse Practitioners) who all work together to provide you with the care you need, when you need it.   Your next appointment:   6 month(s)  The format for your next appointment:   In Person  Provider:   Rozann Lesches, MD   Other Instructions None      Thank you for choosing [facility] !

## 2020-07-22 ENCOUNTER — Telehealth: Payer: Self-pay | Admitting: Internal Medicine

## 2020-07-22 NOTE — Telephone Encounter (Signed)
Needs booster

## 2020-07-22 NOTE — Telephone Encounter (Signed)
Patient didn't think he needed to be seen. He is set for booster on Thursday.

## 2020-07-22 NOTE — Telephone Encounter (Signed)
Patient got cut on some rusty metal at [employer] today, called to check the date of his last tetanus shot, he is not due until 1.25.2023, however patient wanted to check to make sure he was still okay, or if there is anything else that he needs to do    If so, he states you can send him a text.

## 2020-07-23 ENCOUNTER — Encounter: Payer: Self-pay | Admitting: Internal Medicine

## 2020-07-24 ENCOUNTER — Ambulatory Visit: Payer: Medicare Other

## 2020-07-27 ENCOUNTER — Encounter: Payer: Self-pay | Admitting: Internal Medicine

## 2020-07-27 DIAGNOSIS — G8929 Other chronic pain: Secondary | ICD-10-CM

## 2020-07-27 DIAGNOSIS — M545 Low back pain, unspecified: Secondary | ICD-10-CM

## 2020-07-28 NOTE — Addendum Note (Signed)
Addended by: Binnie Rail on: 07/28/2020 08:11 PM   Modules accepted: Orders

## 2020-08-18 ENCOUNTER — Other Ambulatory Visit: Payer: Self-pay | Admitting: Internal Medicine

## 2020-08-19 DIAGNOSIS — M4310 Spondylolisthesis, site unspecified: Secondary | ICD-10-CM | POA: Diagnosis not present

## 2020-08-19 DIAGNOSIS — R6889 Other general symptoms and signs: Secondary | ICD-10-CM | POA: Diagnosis not present

## 2020-08-19 DIAGNOSIS — M545 Low back pain, unspecified: Secondary | ICD-10-CM | POA: Diagnosis not present

## 2020-08-19 DIAGNOSIS — M47816 Spondylosis without myelopathy or radiculopathy, lumbar region: Secondary | ICD-10-CM | POA: Diagnosis not present

## 2020-08-19 DIAGNOSIS — F17210 Nicotine dependence, cigarettes, uncomplicated: Secondary | ICD-10-CM | POA: Diagnosis not present

## 2020-09-15 NOTE — Telephone Encounter (Signed)
Nice job on the weight loss.  Yes, that should be fine for you to take the aspirin every other day.

## 2020-09-19 ENCOUNTER — Telehealth: Payer: Self-pay | Admitting: Internal Medicine

## 2020-09-19 NOTE — Telephone Encounter (Signed)
LVM  For pt to rtn my call to schedule AWV with [facility]. Please schedule AWV if pt calls the office.

## 2020-09-30 ENCOUNTER — Encounter: Payer: Self-pay | Admitting: Internal Medicine

## 2020-09-30 NOTE — Progress Notes (Signed)
Subjective:    Patient ID: Danny Johnson, male    DOB: [birth year], 70 y.o.   MRN: [id number]  HPI The patient is here for an acute visit.  On March 10 his girlfriend for years broke up with him.  He envisioned getting married to her and spending the rest of his life with her.  He was devastated by the break-up.  They did everything together and he did everything she asked if needed.  Since then he has increased his alcohol consumption.  He wants to quit and he knows he needs to quit, but he is concerned about having withdrawal.  He is drinking scotch and water.  He drinks about a half a gallon every 4 days.  He did have a drink about 2 hours ago.  He does feel restless at times.  He is embarrassed and he just wants to quit.  He has never had an issue like this before.  He is still playing golf 3 times a week and dancing 3 times a week.  He is not eating much.  His sleep is okay except for getting up and going to the bathroom 3 times a night.  He does have some increased anxiety and depression.  He denies any thoughts of hurting himself or hurting anyone else.  He has 3 kids and 3 grandkids and he would never do that.  He denies any nausea, headaches, sweats, visual or auditory hallucinations.  He feels a little shaky at times, but denies any tremors.   He does not want to go to the hospital and will not go.  He does have a therapist that he is going to meet with tomorrow and this person has helped him in the past with other things.   Medications and allergies reviewed with patient and updated if appropriate.  Patient Active Problem List   Diagnosis Date Noted  . Loose stools 06/12/2018  . STD (sexually transmitted disease) 06/12/2018  . Herpes simplex 06/10/2018  . Obesity (BMI 30.0-34.9) 09/26/2017  . Ventral hernia without obstruction or gangrene 09/26/2017  . Right inguinal hernia 09/26/2017  . Prediabetes 10/07/2015  . Coronary atherosclerosis of native coronary artery  07/09/2011  . ERECTILE DYSFUNCTION, ORGANIC 11/13/2009  . Allergic rhinitis 06/19/2009  . Aortic stenosis with bicuspid valve 11/05/2008  . Hyperlipidemia 03/18/2008  . Anxiety state 03/18/2008  . Essential hypertension 03/18/2008  . PVC's (premature ventricular contractions) 03/18/2008    Current Outpatient Medications on File Prior to Visit  Medication Sig Dispense Refill  . albuterol (VENTOLIN HFA) 108 (90 Base) MCG/ACT inhaler INHALE 2 PUFFS BY MOUTH INTO THE LUNGS EVERY 6 HOURS AS NEEDED FOR WHEEZING OR SHORTNESS OF BREATH 54 g 2  . aspirin EC 81 MG tablet Take 1 tablet (81 mg total) by mouth every other day. 90 tablet 3  . atorvastatin (LIPITOR) 10 MG tablet Take 1 tablet (10 mg total) by mouth daily. 90 tablet 3  . quinapril (ACCUPRIL) 20 MG tablet Take 1 tablet (20 mg total) by mouth daily. 90 tablet 3  . sildenafil (REVATIO) 20 MG tablet TAKE 3-5 TABLETS BY MOUTH DAILY AS NEEDED 30 tablet 0  . venlafaxine XR (EFFEXOR-XR) 37.5 MG 24 hr capsule Take 37.5 mg by mouth every morning.     No current facility-administered medications on file prior to visit.    Past Medical History:  Diagnosis Date  . Anxiety   . Aortic stenosis   . Arthritis   . Asthma   .  Bicuspid aortic valve   . Coronary atherosclerosis of native coronary artery    Minimal, LVEF 60%  . Essential hypertension, benign   . Melanoma ([place]) 1993  . Mixed hyperlipidemia   . PVC's (premature ventricular contractions)     Past Surgical History:  Procedure Laterality Date  . HERNIA REPAIR  12/2017  . TONSILLECTOMY      Social History   Socioeconomic History  . Marital status: Divorced    Spouse name: Not on file  . Number of children: Not on file  . Years of education: Not on file  . Highest education level: Not on file  Occupational History  . Not on file  Tobacco Use  . Smoking status: Former Smoker    Packs/day: 1.00    Years: 10.00    Pack years: 10.00    Types: Cigarettes    Start date:  07/21/2001  . Smokeless tobacco: Never Used  . Tobacco comment: quit 2019  Vaping Use  . Vaping Use: Never used  Substance and Sexual Activity  . Alcohol use: Yes    Alcohol/week: 42.0 standard drinks    Types: 42 Standard drinks or equivalent per week    Comment: 4-6 drinks daily, beer/wine  . Drug use: No  . Sexual activity: Not on file  Other Topics Concern  . Not on file  Social History Narrative  . Not on file   Social Determinants of Health   Financial Resource Strain: Not on file  Food Insecurity: Not on file  Transportation Needs: Not on file  Physical Activity: Not on file  Stress: Not on file  Social Connections: Not on file    Family History  Problem Relation Age of Onset  . Dementia Mother     Review of Systems  Constitutional: Positive for appetite change. Negative for fever.  Respiratory: Negative for shortness of breath.   Cardiovascular: Negative for chest pain, palpitations and leg swelling.  Gastrointestinal: Negative for abdominal pain and nausea.  Neurological: Negative for dizziness, tremors, weakness, light-headedness and headaches.  Psychiatric/Behavioral: Positive for dysphoric mood. Negative for agitation, confusion, hallucinations, self-injury, sleep disturbance and suicidal ideas. The patient is nervous/anxious. The patient is not hyperactive.        Objective:   Vitals:   10/01/20 1540  BP: 122/72  Pulse: 94  Temp: 98.6 F (37 C)  SpO2: 96%   BP Readings from Last 3 Encounters:  10/01/20 122/72  06/23/20 138/88  12/18/19 122/74   Wt Readings from Last 3 Encounters:  10/01/20 236 lb (107 kg)  06/23/20 252 lb (114.3 kg)  12/18/19 (!) 248 lb (112.5 kg)   Body mass index is 30.71 kg/m.   Physical Exam Constitutional:      General: He is not in acute distress.    Appearance: Normal appearance. He is not ill-appearing, toxic-appearing or diaphoretic.  HENT:     Head: Normocephalic and atraumatic.  Eyes:      Conjunctiva/sclera: Conjunctivae normal.  Cardiovascular:     Rate and Rhythm: Normal rate and regular rhythm.     Heart sounds: No murmur heard.   Pulmonary:     Effort: Pulmonary effort is normal. No respiratory distress.     Breath sounds: Normal breath sounds. No wheezing or rales.  Musculoskeletal:     Right lower leg: No edema.     Left lower leg: No edema.  Skin:    General: Skin is warm and dry.  Neurological:     Mental Status: He is  alert and oriented to person, place, and time.     Cranial Nerves: No cranial nerve deficit.     Motor: No weakness.     Coordination: Coordination normal.     Gait: Gait normal.  Psychiatric:        Behavior: Behavior normal.        Thought Content: Thought content normal.        Judgment: Judgment normal.     Comments: Depressed affect            Assessment & Plan:    See Problem List for Assessment and Plan of chronic medical problems.    This visit occurred during the SARS-CoV-2 public health emergency.  Safety protocols were in place, including screening questions prior to the visit, additional usage of staff PPE, and extensive cleaning of exam room while observing appropriate contact time as indicated for disinfecting solutions.

## 2020-10-01 ENCOUNTER — Encounter: Payer: Self-pay | Admitting: Internal Medicine

## 2020-10-01 ENCOUNTER — Telehealth: Payer: Self-pay | Admitting: Internal Medicine

## 2020-10-01 ENCOUNTER — Other Ambulatory Visit: Payer: Self-pay

## 2020-10-01 ENCOUNTER — Ambulatory Visit: Payer: Medicare Other | Admitting: Internal Medicine

## 2020-10-01 DIAGNOSIS — E782 Mixed hyperlipidemia: Secondary | ICD-10-CM

## 2020-10-01 DIAGNOSIS — F32A Depression, unspecified: Secondary | ICD-10-CM | POA: Insufficient documentation

## 2020-10-01 DIAGNOSIS — F329 Major depressive disorder, single episode, unspecified: Secondary | ICD-10-CM | POA: Diagnosis not present

## 2020-10-01 DIAGNOSIS — F411 Generalized anxiety disorder: Secondary | ICD-10-CM

## 2020-10-01 DIAGNOSIS — I10 Essential (primary) hypertension: Secondary | ICD-10-CM

## 2020-10-01 DIAGNOSIS — R7303 Prediabetes: Secondary | ICD-10-CM

## 2020-10-01 DIAGNOSIS — F1020 Alcohol dependence, uncomplicated: Secondary | ICD-10-CM

## 2020-10-01 MED ORDER — VENLAFAXINE HCL ER 75 MG PO CP24: 75.0000 mg | ORAL_CAPSULE | Freq: Every day | ORAL | 5 refills | Status: DC

## 2020-10-01 MED ORDER — DIAZEPAM 10 MG PO TABS: ORAL_TABLET | ORAL | 0 refills | Status: DC

## 2020-10-01 NOTE — Telephone Encounter (Signed)
Patient is requesting to do his blood work prior to his appointment on 11/25/2020. Can you put in the orders please?  Please advise.

## 2020-10-01 NOTE — Telephone Encounter (Signed)
We are seeing patient today.

## 2020-10-01 NOTE — Addendum Note (Signed)
Addended by: Binnie Rail on: 10/01/2020 04:13 PM   Modules accepted: Orders

## 2020-10-01 NOTE — Patient Instructions (Addendum)
Day 1 - Valium 10-20 mg every 6-12 hours Day 2 - Valium 10 mg every 6 hrs Day 3 - Valium 10 mg twice a day Day 4 - Valium 10 mg at bedtime      Increase Effexor to 75 mg daily

## 2020-10-01 NOTE — Assessment & Plan Note (Signed)
New problem Related to break-up with his girlfriend PHQ-9 score of 22 indicates severe depression and I think that alcoholism is contributing Will be going through outpatient detox Has an appointment with a counselor tomorrow Increase Effexor to 75 mg daily

## 2020-10-01 NOTE — Telephone Encounter (Signed)
I forgot to discuss this with him today at his visit.  I did order blood work.  Can you call him Thursday or Friday and let them know the blood work is ordered-schedule I.  Also please ask him how he is doing.  Thank you.

## 2020-10-01 NOTE — Assessment & Plan Note (Signed)
New problem He is drinking excessively and he knows this is a problem.  He wants to stop drinking, but knows the risks of seizures, DTs.  He refuses to go into the hospital and would like some help quitting.  He understands the risks of doing this not in a hospital setting His daughter is currently living with him and will monitor him closely Using the CIWA withdrawal he is very mild-mild symptoms at this time He will stop drinking Start Valium 10-20 mg every 6-12 hours/day 1, 10 mg every 6 hours/day 2, 10 mg every 12 hours/day 3 and day 4 he will take 10 mg at bedtime We will increase his Effexor to 75 mg daily He will start seeing his counselor tomorrow He knows to call with questions concerns or any problems and to come in if needed

## 2020-10-01 NOTE — Addendum Note (Signed)
Addended by: Binnie Rail on: 10/01/2020 12:14 PM   Modules accepted: Orders

## 2020-10-01 NOTE — Assessment & Plan Note (Signed)
Chronic Not controlled Increase Effexor to 75 mg daily

## 2020-10-02 NOTE — Telephone Encounter (Signed)
Spoke with patient today. States that he feels fine. Lab appointment set for next Tuesday per patient request.

## 2020-10-07 ENCOUNTER — Other Ambulatory Visit (INDEPENDENT_AMBULATORY_CARE_PROVIDER_SITE_OTHER): Payer: Medicare Other

## 2020-10-07 ENCOUNTER — Other Ambulatory Visit: Payer: Self-pay

## 2020-10-07 DIAGNOSIS — R7303 Prediabetes: Secondary | ICD-10-CM | POA: Diagnosis not present

## 2020-10-07 DIAGNOSIS — E782 Mixed hyperlipidemia: Secondary | ICD-10-CM | POA: Diagnosis not present

## 2020-10-07 DIAGNOSIS — I10 Essential (primary) hypertension: Secondary | ICD-10-CM | POA: Diagnosis not present

## 2020-10-07 LAB — CBC WITH DIFFERENTIAL/PLATELET
Basophils Absolute: 0.1 10*3/uL (ref 0.0–0.1)
Basophils Relative: 0.8 % (ref 0.0–3.0)
Eosinophils Absolute: 0.3 10*3/uL (ref 0.0–0.7)
Eosinophils Relative: 4.8 % (ref 0.0–5.0)
HCT: 48.1 % (ref 39.0–52.0)
Hemoglobin: 16.5 g/dL (ref 13.0–17.0)
Lymphocytes Relative: 19.8 % (ref 12.0–46.0)
Lymphs Abs: 1.3 10*3/uL (ref 0.7–4.0)
MCHC: 34.3 g/dL (ref 30.0–36.0)
MCV: 101.9 fl — ABNORMAL HIGH (ref 78.0–100.0)
Monocytes Absolute: 0.7 10*3/uL (ref 0.1–1.0)
Monocytes Relative: 10.9 % (ref 3.0–12.0)
Neutro Abs: 4.3 10*3/uL (ref 1.4–7.7)
Neutrophils Relative %: 63.7 % (ref 43.0–77.0)
Platelets: 236 10*3/uL (ref 150.0–400.0)
RBC: 4.73 Mil/uL (ref 4.22–5.81)
RDW: 13.6 % (ref 11.5–15.5)
WBC: 6.7 10*3/uL (ref 4.0–10.5)

## 2020-10-07 LAB — LIPID PANEL
Cholesterol: 133 mg/dL (ref 0–200)
HDL: 54.8 mg/dL (ref >39.00)
LDL Cholesterol: 68 mg/dL (ref 0–99)
NonHDL: 78.69
Total CHOL/HDL Ratio: 2
Triglycerides: 55 mg/dL (ref 0.0–149.0)
VLDL: 11 mg/dL (ref 0.0–40.0)

## 2020-10-07 LAB — COMPREHENSIVE METABOLIC PANEL
ALT: 26 U/L (ref 0–53)
AST: 20 U/L (ref 0–37)
Albumin: 4.4 g/dL (ref 3.5–5.2)
Alkaline Phosphatase: 76 U/L (ref 39–117)
BUN: 11 mg/dL (ref 6–23)
CO2: 30 mEq/L (ref 19–32)
Calcium: 9.1 mg/dL (ref 8.4–10.5)
Chloride: 102 mEq/L (ref 96–112)
Creatinine, Ser: 0.75 mg/dL (ref 0.40–1.50)
GFR: 90.64 mL/min (ref >60.00)
Glucose, Bld: 108 mg/dL — ABNORMAL HIGH (ref 70–99)
Potassium: 3.9 mEq/L (ref 3.5–5.1)
Sodium: 138 mEq/L (ref 135–145)
Total Bilirubin: 0.6 mg/dL (ref 0.2–1.2)
Total Protein: 6.9 g/dL (ref 6.0–8.3)

## 2020-10-07 LAB — HEMOGLOBIN A1C: Hgb A1c MFr Bld: 5.7 % (ref 4.6–6.5)

## 2020-10-21 ENCOUNTER — Telehealth: Payer: Self-pay | Admitting: Cardiology

## 2020-10-21 ENCOUNTER — Other Ambulatory Visit: Payer: Self-pay | Admitting: Internal Medicine

## 2020-10-21 NOTE — Telephone Encounter (Signed)
Spoke with pt who states that it was hot out yesterday and he was drinking water but he sweats a lot. Pt said he just wanted to let Dr. Domenic Polite know what happened.

## 2020-10-21 NOTE — Telephone Encounter (Signed)
Pt notified and voiced understanding 

## 2020-10-21 NOTE — Telephone Encounter (Signed)
Patient was golfing yesterday and on the 10th green, he felt like he was going to pass out.  He blacked out, went down on his knees, shook it off, but never passed out.  He thinks he may have been dehydrated. Patient states he was drinking water. Echo is scheduled at end of July. Please advise.

## 2020-10-21 NOTE — Telephone Encounter (Signed)
Thank you for letting me know.  It sounds like he may have gotten overheated and perhaps dehydrated, but would observe for any recurring exertional symptoms in light of his aortic stenosis.  Keep echocardiogram as scheduled in July unless symptoms escalate.

## 2020-10-23 DIAGNOSIS — H524 Presbyopia: Secondary | ICD-10-CM | POA: Diagnosis not present

## 2020-11-04 ENCOUNTER — Other Ambulatory Visit: Payer: Self-pay | Admitting: Student

## 2020-11-05 ENCOUNTER — Telehealth: Payer: Self-pay | Admitting: Internal Medicine

## 2020-11-05 ENCOUNTER — Telehealth: Payer: Self-pay

## 2020-11-05 MED ORDER — QUINAPRIL HCL 10 MG PO TABS: 10.0000 mg | ORAL_TABLET | Freq: Every day | ORAL | 3 refills | Status: DC

## 2020-11-05 NOTE — Progress Notes (Signed)
  Chronic Care Management   Outreach Note  11/05/2020 Name: TILLMON KISLING MRN: [id number] DOB: 1951/04/18  Referred by: Binnie Rail, MD Reason for referral : No chief complaint on file.   An unsuccessful telephone outreach was attempted today. The patient was referred to the pharmacist for assistance with care management and care coordination.   Follow Up Plan:   Lauretta Grill Upstream Scheduler

## 2020-11-05 NOTE — Chronic Care Management (AMB) (Signed)
  Chronic Care Management   Note  11/05/2020 Name: Danny Johnson MRN: [id number] DOB: 22-Sep-1950  Danny Johnson is a 70 y.o. year old male who is a primary care patient of Burns, Claudina Lick, MD. I reached out to [name] by phone today in response to a referral sent by [name] PCP, Binnie Rail, MD.   [name] was given information about Chronic Care Management services today including:  CCM service includes personalized support from designated clinical staff supervised by his physician, including individualized plan of care and coordination with other care providers 24/7 contact phone numbers for assistance for urgent and routine care needs. Service will only be billed when office clinical staff spend 20 minutes or more in a month to coordinate care. Only one practitioner may furnish and bill the service in a calendar month. The patient may stop CCM services at any time (effective at the end of the month) by phone call to the office staff.   Patient agreed to services and verbal consent obtained.   Follow up plan:   Lauretta Grill Upstream Scheduler

## 2020-11-05 NOTE — Telephone Encounter (Signed)
Per B.Strader, PA-C, Quinapril decreased to 10 mg qd, e-scribed to [employer] order

## 2020-11-12 ENCOUNTER — Other Ambulatory Visit: Payer: Self-pay | Admitting: Internal Medicine

## 2020-11-14 ENCOUNTER — Other Ambulatory Visit: Payer: Self-pay | Admitting: Internal Medicine

## 2020-11-24 ENCOUNTER — Encounter: Payer: Self-pay | Admitting: Internal Medicine

## 2020-11-24 NOTE — Progress Notes (Signed)
Subjective:    Patient ID: Danny Johnson, male    DOB: September 06, 1950, 70 y.o.   MRN: [id number]  HPI He is here for a physical exam.   Had labs 5/22  Medications and allergies reviewed with patient and updated if appropriate.  Patient Active Problem List   Diagnosis Date Noted   Alcoholism ([place]) 10/01/2020   Depression 10/01/2020   Loose stools 06/12/2018   STD (sexually transmitted disease) 06/12/2018   Herpes simplex 06/10/2018   Obesity (BMI 30.0-34.9) 09/26/2017   Ventral hernia without obstruction or gangrene 09/26/2017   Right inguinal hernia 09/26/2017   Prediabetes 10/07/2015   Coronary atherosclerosis of native coronary artery 07/09/2011   ERECTILE DYSFUNCTION, ORGANIC 11/13/2009   Allergic rhinitis 06/19/2009   Aortic stenosis with bicuspid valve 11/05/2008   Hyperlipidemia 03/18/2008   Anxiety state 03/18/2008   Essential hypertension 03/18/2008   PVC's (premature ventricular contractions) 03/18/2008    Current Outpatient Medications on File Prior to Visit  Medication Sig Dispense Refill   albuterol (VENTOLIN HFA) 108 (90 Base) MCG/ACT inhaler INHALE 2 PUFFS BY MOUTH INTO THE LUNGS EVERY 6 HOURS AS NEEDED FOR WHEEZING OR SHORTNESS OF BREATH 54 g 2   aspirin EC 81 MG tablet Take 1 tablet (81 mg total) by mouth every other day. 90 tablet 3   atorvastatin (LIPITOR) 10 MG tablet Take 1 tablet (10 mg total) by mouth daily. 90 tablet 3   diazepam (VALIUM) 10 MG tablet Day 1 10-20 mg every 6-12 hr, day 2 10 mg q 6 hr, day 3 10 mg bid, day 4 10 mg HS 15 tablet 0   quinapril (ACCUPRIL) 10 MG tablet Take 1 tablet (10 mg total) by mouth daily. 90 tablet 3   sildenafil (REVATIO) 20 MG tablet TAKE 3-5 TABLETS BY MOUTH DAILY AS NEEDED 30 tablet 0   venlafaxine XR (EFFEXOR XR) 75 MG 24 hr capsule Take 1 capsule (75 mg total) by mouth daily with breakfast. 30 capsule 5   No current facility-administered medications on file prior to visit.    Past Medical History:  Diagnosis  Date   Anxiety    Aortic stenosis    Arthritis    Asthma    Bicuspid aortic valve    Coronary atherosclerosis of native coronary artery    Minimal, LVEF 60%   Essential hypertension, benign    Melanoma ([place]) 1993   Mixed hyperlipidemia    PVC's (premature ventricular contractions)     Past Surgical History:  Procedure Laterality Date   HERNIA REPAIR  12/2017   TONSILLECTOMY      Social History   Socioeconomic History   Marital status: Divorced    Spouse name: Not on file   Number of children: Not on file   Years of education: Not on file   Highest education level: Not on file  Occupational History   Not on file  Tobacco Use   Smoking status: Former    Packs/day: 1.00    Years: 10.00    Pack years: 10.00    Types: Cigarettes    Start date: 07/21/2001   Smokeless tobacco: Never   Tobacco comments:    quit 2019  Vaping Use   Vaping Use: Never used  Substance and Sexual Activity   Alcohol use: Yes    Alcohol/week: 42.0 standard drinks    Types: 42 Standard drinks or equivalent per week    Comment: 4-6 drinks daily, beer/wine   Drug use: No   Sexual  activity: Not on file  Other Topics Concern   Not on file  Social History Narrative   Not on file   Social Determinants of Health   Financial Resource Strain: Not on file  Food Insecurity: Not on file  Transportation Needs: Not on file  Physical Activity: Not on file  Stress: Not on file  Social Connections: Not on file    Family History  Problem Relation Age of Onset   Dementia Mother     Review of Systems     Objective:  There were no vitals filed for this visit. There were no vitals filed for this visit. There is no height or weight on file to calculate BMI.  BP Readings from Last 3 Encounters:  10/01/20 122/72  06/23/20 138/88  12/18/19 122/74    Wt Readings from Last 3 Encounters:  10/01/20 236 lb (107 kg)  06/23/20 252 lb (114.3 kg)  12/18/19 (!) 248 lb (112.5 kg)     Physical  Exam Constitutional: He appears well-developed and well-nourished. No distress.  HENT:  Head: Normocephalic and atraumatic.  Right Ear: External ear normal.  Left Ear: External ear normal.  Mouth/Throat: Oropharynx is clear and moist.  Normal ear canals and TM b/l  Eyes: Conjunctivae and EOM are normal.  Neck: Neck supple. No tracheal deviation present. No thyromegaly present.  No carotid bruit  Cardiovascular: Normal rate, regular rhythm, normal heart sounds and intact distal pulses.   No murmur heard. Pulmonary/Chest: Effort normal and breath sounds normal. No respiratory distress. He has no wheezes. He has no rales.  Abdominal: Soft. He exhibits no distension. There is no tenderness.  Genitourinary: deferred  Musculoskeletal: He exhibits no edema.  Lymphadenopathy:   He has no cervical adenopathy.  Skin: Skin is warm and dry. He is not diaphoretic.  Psychiatric: He has a normal mood and affect. His behavior is normal.         Assessment & Plan:   Physical exam: Screening blood work  reviewed from 09/2020 Exercise Diet Weight Substance abuse   Health Maintenance  Topic Date Due   Zoster Vaccines- Shingrix (1 of 2) Never done   COVID-19 Vaccine (4 - Booster for Pfizer series) 06/07/2020   INFLUENZA VACCINE  12/22/2020   COLONOSCOPY (Pts 45-34yrs Insurance coverage will need to be confirmed)  07/21/2026   TETANUS/TDAP  07/23/2029   Hepatitis C Screening  Completed   PNA vac Low Risk Adult  Completed   HPV VACCINES  Aged Out    Discussed health maintenance recommendations    See Problem List for Assessment and Plan of chronic medical problems.   This visit occurred during the SARS-CoV-2 public health emergency.  Safety protocols were in place, including screening questions prior to the visit, additional usage of staff PPE, and extensive cleaning of exam room while observing appropriate contact time as indicated for disinfecting solutions. This encounter was  created in error - please disregard.

## 2020-11-24 NOTE — Patient Instructions (Signed)
Blood work was ordered.     Medications changes include :     Your prescription(s) have been submitted to your pharmacy. Please take as directed and contact our office if you believe you are having problem(s) with the medication(s).   A referral was ordered for        Someone from their office will call you to schedule an appointment.    Please followup in 6 months    Health Maintenance, Male Adopting a healthy lifestyle and getting preventive care are important in promoting health and wellness. Ask your health care provider about: The right schedule for you to have regular tests and exams. Things you can do on your own to prevent diseases and keep yourself healthy. What should I know about diet, weight, and exercise? Eat a healthy diet  Eat a diet that includes plenty of vegetables, fruits, low-fat dairy products, and lean protein. Do not eat a lot of foods that are high in solid fats, added sugars, or sodium.  Maintain a healthy weight Body mass index (BMI) is a measurement that can be used to identify possible weight problems. It estimates body fat based on height and weight. Your health care provider can help determine your BMI and help you achieve or maintain ahealthy weight. Get regular exercise Get regular exercise. This is one of the most important things you can do for your health. Most adults should: Exercise for at least 150 minutes each week. The exercise should increase your heart rate and make you sweat (moderate-intensity exercise). Do strengthening exercises at least twice a week. This is in addition to the moderate-intensity exercise. Spend less time sitting. Even light physical activity can be beneficial. Watch cholesterol and blood lipids Have your blood tested for lipids and cholesterol at 72 years of age, then havethis test every 5 years. You may need to have your cholesterol levels checked more often if: Your lipid or cholesterol levels are high. You are  older than 72 years of age. You are at high risk for heart disease. What should I know about cancer screening? Many types of cancers can be detected early and may often be prevented. Depending on your health history and family history, you may need to have cancer screening at various ages. This may include screening for: Colorectal cancer. Prostate cancer. Skin cancer. Lung cancer. What should I know about heart disease, diabetes, and high blood pressure? Blood pressure and heart disease High blood pressure causes heart disease and increases the risk of stroke. This is more likely to develop in people who have high blood pressure readings, are of African descent, or are overweight. Talk with your health care provider about your target blood pressure readings. Have your blood pressure checked: Every 3-5 years if you are 36-38 years of age. Every year if you are 89 years old or older. If you are between the ages of 22 and 82 and are a current or former smoker, ask your health care provider if you should have a one-time screening for abdominal aortic aneurysm (AAA). Diabetes Have regular diabetes screenings. This checks your fasting blood sugar level. Have the screening done: Once every three years after age 74 if you are at a normal weight and have a low risk for diabetes. More often and at a younger age if you are overweight or have a high risk for diabetes. What should I know about preventing infection? Hepatitis B If you have a higher risk for hepatitis B, you should be screened  for this virus. Talk with your health care provider to find out if you are at risk forhepatitis B infection. Hepatitis C Blood testing is recommended for: Everyone born from [birth year] through 1967. Anyone with known risk factors for hepatitis C. Sexually transmitted infections (STIs) You should be screened each year for STIs, including gonorrhea and chlamydia, if: You are sexually active and are younger than 72 years  of age. You are older than 72 years of age and your health care provider tells you that you are at risk for this type of infection. Your sexual activity has changed since you were last screened, and you are at increased risk for chlamydia or gonorrhea. Ask your health care provider if you are at risk. Ask your health care provider about whether you are at high risk for HIV. Your health care provider may recommend a prescription medicine to help prevent HIV infection. If you choose to take medicine to prevent HIV, you should first get tested for HIV. You should then be tested every 3 months for as long as you are taking the medicine. Follow these instructions at home: Lifestyle Do not use any products that contain nicotine or tobacco, such as cigarettes, e-cigarettes, and chewing tobacco. If you need help quitting, ask your health care provider. Do not use street drugs. Do not share needles. Ask your health care provider for help if you need support or information about quitting drugs. Alcohol use Do not drink alcohol if your health care provider tells you not to drink. If you drink alcohol: Limit how much you have to 0-2 drinks a day. Be aware of how much alcohol is in your drink. In the [place] one drink equals one 12 oz bottle of beer (355 mL), one 5 oz glass of wine (148 mL), or one 1 oz glass of hard liquor (44 mL). General instructions Schedule regular health, dental, and eye exams. Stay current with your vaccines. Tell your health care provider if: You often feel depressed. You have ever been abused or do not feel safe at home. Summary Adopting a healthy lifestyle and getting preventive care are important in promoting health and wellness. Follow your health care provider's instructions about healthy diet, exercising, and getting tested or screened for diseases. Follow your health care provider's instructions on monitoring your cholesterol and blood pressure. This information is not  intended to replace advice given to you by your health care provider. Make sure you discuss any questions you have with your healthcare provider. Document Revised: 05/03/2018 Document Reviewed: 05/03/2018 Elsevier Patient Education  2022 [employer].

## 2020-11-25 ENCOUNTER — Encounter: Payer: Medicare Other | Admitting: Internal Medicine

## 2020-12-01 NOTE — Patient Instructions (Addendum)
  Blood work was ordered.     Medications changes include :   none  Your prescription(s) have been submitted to your pharmacy. Please take as directed and contact our office if you believe you are having problem(s) with the medication(s).   Please followup in 1 year

## 2020-12-01 NOTE — Progress Notes (Signed)
Subjective:    Patient ID: Danny Johnson, male    DOB: 1950/10/03, 70 y.o.   MRN: [id number]  HPI He is here for a physical exam.     He has no concerns.  Overall he feels well.  Medications and allergies reviewed with patient and updated if appropriate.  Patient Active Problem List   Diagnosis Date Noted   Alcoholism ([place]) 10/01/2020   Depression 10/01/2020   STD (sexually transmitted disease) 06/12/2018   Herpes simplex 06/10/2018   Obesity (BMI 30.0-34.9) 09/26/2017   Ventral hernia without obstruction or gangrene 09/26/2017   Right inguinal hernia 09/26/2017   Prediabetes 10/07/2015   Coronary atherosclerosis of native coronary artery 07/09/2011   ERECTILE DYSFUNCTION, ORGANIC 11/13/2009   Allergic rhinitis 06/19/2009   Aortic stenosis with bicuspid valve 11/05/2008   Hyperlipidemia 03/18/2008   Anxiety state 03/18/2008   Essential hypertension 03/18/2008   PVC's (premature ventricular contractions) 03/18/2008    Current Outpatient Medications on File Prior to Visit  Medication Sig Dispense Refill   albuterol (VENTOLIN HFA) 108 (90 Base) MCG/ACT inhaler INHALE 2 PUFFS BY MOUTH INTO THE LUNGS EVERY 6 HOURS AS NEEDED FOR WHEEZING OR SHORTNESS OF BREATH 54 g 2   aspirin EC 81 MG tablet Take 1 tablet (81 mg total) by mouth every other day. 90 tablet 3   atorvastatin (LIPITOR) 10 MG tablet Take 1 tablet (10 mg total) by mouth daily. 90 tablet 3   quinapril (ACCUPRIL) 10 MG tablet Take 1 tablet (10 mg total) by mouth daily. 90 tablet 3   venlafaxine XR (EFFEXOR-XR) 37.5 MG 24 hr capsule Take 37.5 mg by mouth daily with breakfast.     No current facility-administered medications on file prior to visit.    Past Medical History:  Diagnosis Date   Anxiety    Aortic stenosis    Arthritis    Asthma    Bicuspid aortic valve    Coronary atherosclerosis of native coronary artery    Minimal, LVEF 60%   Essential hypertension, benign    Melanoma ([place]) 1993   Mixed  hyperlipidemia    PVC's (premature ventricular contractions)     Past Surgical History:  Procedure Laterality Date   HERNIA REPAIR  12/2017   TONSILLECTOMY      Social History   Socioeconomic History   Marital status: Divorced    Spouse name: Not on file   Number of children: Not on file   Years of education: Not on file   Highest education level: Not on file  Occupational History   Not on file  Tobacco Use   Smoking status: Former    Packs/day: 1.00    Years: 10.00    Pack years: 10.00    Types: Cigarettes    Start date: 07/21/2001   Smokeless tobacco: Never   Tobacco comments:    quit 2019  Vaping Use   Vaping Use: Never used  Substance and Sexual Activity   Alcohol use: Yes    Alcohol/week: 42.0 standard drinks    Types: 42 Standard drinks or equivalent per week    Comment: 4-6 drinks daily, beer/wine   Drug use: No   Sexual activity: Not on file  Other Topics Concern   Not on file  Social History Narrative   Not on file   Social Determinants of Health   Financial Resource Strain: Not on file  Food Insecurity: Not on file  Transportation Needs: Not on file  Physical Activity: Not on file  Stress: Not on file  Social Connections: Not on file    Family History  Problem Relation Age of Onset   Dementia Mother     Review of Systems  Constitutional:  Negative for chills and fever.  Eyes:  Negative for visual disturbance.  Respiratory:  Negative for cough, shortness of breath and wheezing.   Cardiovascular:  Negative for chest pain, palpitations and leg swelling.  Gastrointestinal:  Negative for abdominal pain, blood in stool, constipation, diarrhea and nausea.       No gerd  Genitourinary:  Negative for difficulty urinating and dysuria.  Musculoskeletal:  Positive for arthralgias (minimal). Negative for back pain.  Skin:  Negative for color change and rash.  Neurological:  Positive for light-headedness (dehydration related). Negative for dizziness and  headaches.  Psychiatric/Behavioral:  Positive for dysphoric mood. The patient is nervous/anxious.       Objective:   Vitals:   12/02/20 1453  BP: 128/80  Pulse: 78  Temp: 98.4 F (36.9 C)  SpO2: 95%   Filed Weights   12/02/20 1453  Weight: 233 lb (105.7 kg)   Body mass index is 30.32 kg/m.  BP Readings from Last 3 Encounters:  12/02/20 128/80  10/01/20 122/72  06/23/20 138/88    Wt Readings from Last 3 Encounters:  12/02/20 233 lb (105.7 kg)  10/01/20 236 lb (107 kg)  06/23/20 252 lb (114.3 kg)     Physical Exam Constitutional: He appears well-developed and well-nourished. No distress.  HENT:  Head: Normocephalic and atraumatic.  Right Ear: External ear normal.  Left Ear: External ear normal.  Mouth/Throat: Oropharynx is clear and moist.  Normal ear canals and TM b/l  Eyes: Conjunctivae and EOM are normal.  Neck: Neck supple. No tracheal deviation present. No thyromegaly present.  No carotid bruit  Cardiovascular: Normal rate, regular rhythm, normal heart sounds and intact distal pulses.   No murmur heard. Pulmonary/Chest: Effort normal and breath sounds normal. No respiratory distress. He has no wheezes. He has no rales.  Abdominal: Soft. He exhibits no distension. There is no tenderness.  Genitourinary: deferred  Musculoskeletal: He exhibits no edema.  Lymphadenopathy:   He has no cervical adenopathy.  Skin: Skin is warm and dry. He is not diaphoretic.  Psychiatric: He has a normal mood and affect. His behavior is normal.         Assessment & Plan:   Physical exam: Screening blood work  reviewed from 09/2020 Exercise dances, golf Diet eating healthy Weight  has lost weight  Substance abuse  none   Health Maintenance  Topic Date Due   Zoster Vaccines- Shingrix (1 of 2) Never done   COVID-19 Vaccine (4 - Booster for Pfizer series) 06/07/2020   INFLUENZA VACCINE  12/22/2020   COLONOSCOPY (Pts 45-78yrs Insurance coverage will need to be confirmed)   07/21/2026   TETANUS/TDAP  07/23/2029   Hepatitis C Screening  Completed   PNA vac Low Risk Adult  Completed   HPV VACCINES  Aged Out    Discussed health maintenance recommendations    See Problem List for Assessment and Plan of chronic medical problems.   This visit occurred during the SARS-CoV-2 public health emergency.  Safety protocols were in place, including screening questions prior to the visit, additional usage of staff PPE, and extensive cleaning of exam room while observing appropriate contact time as indicated for disinfecting solutions.

## 2020-12-02 ENCOUNTER — Ambulatory Visit (INDEPENDENT_AMBULATORY_CARE_PROVIDER_SITE_OTHER): Payer: Medicare Other | Admitting: Internal Medicine

## 2020-12-02 ENCOUNTER — Other Ambulatory Visit: Payer: Self-pay

## 2020-12-02 ENCOUNTER — Encounter: Payer: Self-pay | Admitting: Internal Medicine

## 2020-12-02 VITALS — BP 128/80 | HR 78 | Temp 98.4°F | Ht 73.5 in | Wt 233.0 lb

## 2020-12-02 DIAGNOSIS — F329 Major depressive disorder, single episode, unspecified: Secondary | ICD-10-CM

## 2020-12-02 DIAGNOSIS — E782 Mixed hyperlipidemia: Secondary | ICD-10-CM

## 2020-12-02 DIAGNOSIS — F411 Generalized anxiety disorder: Secondary | ICD-10-CM

## 2020-12-02 DIAGNOSIS — I10 Essential (primary) hypertension: Secondary | ICD-10-CM

## 2020-12-02 DIAGNOSIS — Z0000 Encounter for general adult medical examination without abnormal findings: Secondary | ICD-10-CM | POA: Diagnosis not present

## 2020-12-02 DIAGNOSIS — R7303 Prediabetes: Secondary | ICD-10-CM

## 2020-12-02 MED ORDER — SILDENAFIL CITRATE 20 MG PO TABS: ORAL_TABLET | ORAL | 2 refills | Status: DC

## 2020-12-02 NOTE — Assessment & Plan Note (Signed)
Chronic Lipids well controlled Continue atorvastatin 10 mg daily Regular exercise and healthy diet encouraged

## 2020-12-02 NOTE — Assessment & Plan Note (Signed)
Chronic Controlled, stable Continue venlafaxine 37.5 milligrams daily

## 2020-12-02 NOTE — Assessment & Plan Note (Signed)
Chronic Lab Results  Component Value Date   HGBA1C 5.7 10/07/2020   Sugar is barely in prediabetic range Continue regular activity, healthy diet

## 2020-12-02 NOTE — Assessment & Plan Note (Signed)
Chronic Controlled, stable Continue venlafaxine 37.5 mg daily

## 2020-12-02 NOTE — Assessment & Plan Note (Signed)
Chronic BP well controlled Continue quinapril 10 mg daily

## 2020-12-15 ENCOUNTER — Telehealth: Payer: Medicare Other

## 2020-12-15 ENCOUNTER — Other Ambulatory Visit: Payer: Self-pay

## 2020-12-15 ENCOUNTER — Ambulatory Visit (HOSPITAL_COMMUNITY)
Admission: RE | Admit: 2020-12-15 | Discharge: 2020-12-15 | Disposition: A | Discharge status: Home / Self Care | Payer: Medicare Other | Source: Ambulatory Visit | Attending: Cardiology | Admitting: Cardiology

## 2020-12-15 DIAGNOSIS — I350 Nonrheumatic aortic (valve) stenosis: Secondary | ICD-10-CM | POA: Diagnosis not present

## 2020-12-15 LAB — ECHOCARDIOGRAM COMPLETE
AR max vel: 0.59 cm2
AV Area VTI: 0.59 cm2
AV Area mean vel: 0.52 cm2
AV Mean grad: 31 mmHg
AV Peak grad: 49 mmHg
Ao pk vel: 3.5 m/s
Area-P 1/2: 2.95 cm2
P 1/2 time: 503 msec
S' Lateral: 2.7 cm

## 2020-12-15 NOTE — Progress Notes (Signed)
*  PRELIMINARY RESULTS* Echocardiogram 2D Echocardiogram has been performed.  Danny Johnson 12/15/2020, 10:15 AM

## 2020-12-17 ENCOUNTER — Telehealth: Payer: Medicare Other

## 2020-12-17 NOTE — Progress Notes (Deleted)
Chronic Care Management Pharmacy Note  12/17/2020 Name:  Danny Johnson MRN:  [id number] DOB:  Jul 04, 1950  Summary: ***  Recommendations/Changes made from today's visit: ***  Plan: ***  Subjective: Danny Johnson is an 70 y.o. year old male who is a primary patient of Burns, Claudina Lick, MD.  The CCM team was consulted for assistance with disease management and care coordination needs.    Engaged with patient by telephone for initial visit in response to provider referral for pharmacy case management and/or care coordination services.   Consent to Services:  The patient was given the following information about Chronic Care Management services today, agreed to services, and gave verbal consent: 1. CCM service includes personalized support from designated clinical staff supervised by the primary care provider, including individualized plan of care and coordination with other care providers 2. 24/7 contact phone numbers for assistance for urgent and routine care needs. 3. Service will only be billed when office clinical staff spend 20 minutes or more in a month to coordinate care. 4. Only one practitioner may furnish and bill the service in a calendar month. 5.The patient may stop CCM services at any time (effective at the end of the month) by phone call to the office staff. 6. The patient will be responsible for cost sharing (co-pay) of up to 20% of the service fee (after annual deductible is met). Patient agreed to services and consent obtained.  Patient Care Team: Binnie Rail, MD as PCP - General (Internal Medicine) Satira Sark, MD as PCP - Cardiology (Cardiology) Allyn Kenner, MD (Dermatology) Delice Bison Darnelle Maffucci, [facility] as Pharmacist (Pharmacist)  Recent office visits: 12/02/2020 - PCP visit - no changes - patient stable  11/25/2020 - PCP visit - no changes noted  10/01/2020 - PCP visit - increased depression - recent breakup with girlfriend, increased venlafaxine, wants to stop drinking,  advised to go to hospital for detox, patient refused, given prescription for valium for detox - agreeable to see counselor   Recent consult visits: 12/15/2020 - ECHO completed -  08/19/2020 - Dr. Levan Hurst [facility] - referred to PT follow up with provider on an as needed basis  06/23/2020 - Dr. Domenic Polite - Cardiology - no changes at appointment, 6 month follow up  Hospital visits: None in previous 6 months  Objective:  Lab Results  Component Value Date   CREATININE 0.75 10/07/2020   BUN 11 10/07/2020   GFR 90.64 10/07/2020   GFRNONAA 106.04 11/07/2009   GFRAA  10/25/2008    >60        The eGFR has been calculated using the MDRD equation. This calculation has not been validated in all clinical situations. eGFR's persistently <60 mL/min signify possible Chronic Kidney Disease.   NA 138 10/07/2020   K 3.9 10/07/2020   CALCIUM 9.1 10/07/2020   CO2 30 10/07/2020   GLUCOSE 108 (H) 10/07/2020    Lab Results  Component Value Date/Time   HGBA1C 5.7 10/07/2020 09:16 AM   HGBA1C 5.7 11/22/2019 10:28 AM   GFR 90.64 10/07/2020 09:16 AM   GFR 90.11 11/22/2019 10:28 AM    Last diabetic Eye exam:  No results found for: HMDIABEYEEXA  Last diabetic Foot exam:  No results found for: HMDIABFOOTEX   Lab Results  Component Value Date   CHOL 133 10/07/2020   HDL 54.80 10/07/2020   LDLCALC 68 10/07/2020   LDLDIRECT 153.1 06/29/2012   TRIG 55.0 10/07/2020   CHOLHDL 2 10/07/2020  Hepatic Function Latest Ref Rng & Units 10/07/2020 11/22/2019 11/14/2018  Total Protein 6.0 - 8.3 g/dL 6.9 6.8 6.5  Albumin 3.5 - 5.2 g/dL 4.4 4.5 4.5  AST 0 - 37 U/L _0 ALT 0 - 53 U/L 26 27 35  Alk Phosphatase 39 - 117 U/L 76 80 78  Total Bilirubin 0.2 - 1.2 mg/dL 0.6 0.7 1.0  Bilirubin, Direct 0.0 - 0.3 mg/dL - - -    Lab Results  Component Value Date/Time   TSH 1.86 11/22/2019 10:28 AM   TSH 1.68 11/14/2018 09:49 AM    CBC Latest Ref Rng & Units 10/07/2020 11/22/2019  11/14/2018  WBC 4.0 - 10.5 K/uL 6.7 6.7 6.5  Hemoglobin 13.0 - 17.0 g/dL 16.5 15.8 15.9  Hematocrit 39.0 - 52.0 % 48.1 45.1 47.0  Platelets 150.0 - 400.0 K/uL 236.0 236.0 211.0    No results found for: VD25OH  Clinical ASCVD: No  The 10-year ASCVD risk score Mikey Bussing DC Jr., et al., 2013) is: 21.1%   Values used to calculate the score:     Age: 55 years     Sex: Male     Is Non-Hispanic African American: No     Diabetic: No     Tobacco smoker: Yes     Systolic Blood Pressure: 017 mmHg     Is BP treated: Yes     HDL Cholesterol: 54.8 mg/dL     Total Cholesterol: 133 mg/dL    Depression screen [facility] 2/9 10/01/2020 11/21/2018 09/26/2017  Decreased Interest 3 1 0  Down, Depressed, Hopeless 3 1 0  PHQ - 2 Score 6 2 0  Altered sleeping 3 1 -  Tired, decreased energy 3 1 -  Change in appetite 3 1 -  Feeling bad or failure about yourself  2 0 -  Trouble concentrating 3 1 -  Moving slowly or fidgety/restless 0 0 -  Suicidal thoughts 0 0 -  PHQ-9 Score 20 6 -  Difficult doing work/chores Somewhat difficult - -     Social History   Tobacco Use  Smoking Status Former   Packs/day: 1.00   Years: 10.00   Pack years: 10.00   Types: Cigarettes   Start date: 07/21/2001  Smokeless Tobacco Never  Tobacco Comments   quit 2019   BP Readings from Last 3 Encounters:  12/02/20 128/80  10/01/20 122/72  06/23/20 138/88   Pulse Readings from Last 3 Encounters:  12/02/20 78  10/01/20 94  06/23/20 88   Wt Readings from Last 3 Encounters:  12/02/20 233 lb (105.7 kg)  10/01/20 236 lb (107 kg)  06/23/20 252 lb (114.3 kg)   BMI Readings from Last 3 Encounters:  12/02/20 30.32 kg/m  10/01/20 30.71 kg/m  06/23/20 32.80 kg/m    Assessment/Interventions: Review of patient past medical history, allergies, medications, health status, including review of consultants reports, laboratory and other test data, was performed as part of comprehensive evaluation and provision of chronic care  management services.   SDOH:  (Social Determinants of Health) assessments and interventions performed: {yes/no:20286}  SDOH Screenings   Alcohol Screen: Not on file  Depression (PHQ2-9): Medium Risk   PHQ-2 Score: 20  Financial Resource Strain: Not on file  Food Insecurity: Not on file  Housing: Not on file  Physical Activity: Not on file  Social Connections: Not on file  Stress: Not on file  Tobacco Use: Medium Risk   Smoking Tobacco Use: Former   Smokeless Tobacco Use: Never  Transportation  Needs: Not on file    CCM Care Plan  Allergies  Allergen Reactions   Dust Mite Extract Shortness Of Breath and Other (See Comments)    Medications Reviewed Today     Reviewed by Binnie Rail, MD (Physician) on 12/02/20 at [place] List Status: <None>   Medication Order Taking? Sig Documenting Provider Last Dose Status Informant  albuterol (VENTOLIN HFA) 108 (90 Base) MCG/ACT inhaler [id number] Yes INHALE 2 PUFFS BY MOUTH INTO THE LUNGS EVERY 6 HOURS AS NEEDED FOR WHEEZING OR SHORTNESS OF BREATH Burns, Claudina Lick, MD Taking Active   aspirin EC 81 MG tablet [id number] Yes Take 1 tablet (81 mg total) by mouth every other day. Satira Sark, MD Taking Active   atorvastatin (LIPITOR) 10 MG tablet [id number] Yes Take 1 tablet (10 mg total) by mouth daily. Binnie Rail, MD Taking Active   quinapril (ACCUPRIL) 10 MG tablet [id number] Yes Take 1 tablet (10 mg total) by mouth daily. Erma Heritage, PA-C Taking Active   sildenafil (REVATIO) 20 MG tablet [id number] Yes TAKE 3-5 TABLETS BY MOUTH DAILY AS NEEDED Burns, Claudina Lick, MD Taking Active   venlafaxine XR (EFFEXOR XR) 75 MG 24 hr capsule [id number] No Take 1 capsule (75 mg total) by mouth daily with breakfast.  Patient not taking: Reported on 12/02/2020   Binnie Rail, MD Not Taking Active   venlafaxine XR (EFFEXOR-XR) 37.5 MG 24 hr capsule [id number] Yes Take 37.5 mg by mouth daily with breakfast. [provider] Taking Active              Patient Active Problem List   Diagnosis Date Noted   Alcoholism ([place]) 10/01/2020   Depression 10/01/2020   STD (sexually transmitted disease) 06/12/2018   Herpes simplex 06/10/2018   Obesity (BMI 30.0-34.9) 09/26/2017   Ventral hernia without obstruction or gangrene 09/26/2017   Right inguinal hernia 09/26/2017   Prediabetes 10/07/2015   Coronary atherosclerosis of native coronary artery 07/09/2011   ERECTILE DYSFUNCTION, ORGANIC 11/13/2009   Allergic rhinitis 06/19/2009   Aortic stenosis with bicuspid valve 11/05/2008   Hyperlipidemia 03/18/2008   Anxiety state 03/18/2008   Essential hypertension 03/18/2008   PVC's (premature ventricular contractions) 03/18/2008    Immunization History  Administered Date(s) Administered   Hepatitis B 11/05/1989   Influenza Split 03/25/2019   Influenza Whole 03/18/2008, 06/19/2009   Influenza, High Dose Seasonal PF 02/01/2020   Influenza, Seasonal, Injecte, Preservative Fre 07/05/2012   Influenza-Unspecified 02/28/2017, 01/22/2018   PFIZER(Purple Top)SARS-COV-2 Vaccination 06/13/2019, 07/04/2019, 03/07/2020   Pneumococcal Conjugate-13 08/26/2014   Pneumococcal Polysaccharide-23 04/11/2008, 10/03/2015   Pneumococcal-Unspecified 04/06/2019   Td 07/24/2019   Tdap 06/18/2011   Zoster, Live 08/21/2013    Conditions to be addressed/monitored:  {USCCMDZASSESSMENTOPTIONS:23563}  There are no care plans that you recently modified to display for this patient.     Medication Assistance: {MEDASSISTANCEINFO:25044}  Compliance/Adherence/Medication fill history: Care Gaps: ***  Patient's preferred pharmacy is:  [facility]) [place], [address] [address] Phone: [phone] Fax: [place] [phone] [place], [place] - [place] [address] Phone: [phone]  Fax: [phone]   Uses pill box? {Yes or If no, why not?:20788} Pt endorses ***% compliance  Care Plan and Follow Up Patient Decision:  {FOLLOWUP:24991}  Plan: {CM FOLLOW UP OEHO:12248}  ***  Current Barriers:  {pharmacybarriers:24917}  Pharmacist Clinical  Goal(s):  Patient will {PHARMACYGOALCHOICES:24921} through collaboration with PharmD and provider.   Interventions: 1:1 collaboration with Binnie Rail, MD regarding development and update of comprehensive plan of care as evidenced by provider attestation and co-signature Inter-disciplinary care team collaboration (see longitudinal plan of care) Comprehensive medication review performed; medication list updated in electronic medical record  Hypertension  (Status:New goal.)   Med Management Intervention: {PHARMMEDMGMNT:25878}  (BP goal <130/80) -{US controlled/uncontrolled:25276} -Current treatment: Quinapril 11m - 1 tablet daily  -Medications previously tried: losartan, metoprolol tartrate,   -Current home readings: *** -Current dietary habits: *** -Current exercise habits: *** -{ACTIONS;DENIES/REPORTS:21021675} hypotensive/hypertensive symptoms -Educated on {CCM BP Counseling:25124} -Counseled to monitor BP at home ***, document, and provide log at future appointments -{CCMPHARMDINTERVENTION:25122}  Hyperlipidemia/ CAD: (LDL goal < 100) -{US controlled/uncontrolled:25276} -Last LDL 685mdL (10/07/2020) -Current treatment: Atorvastatin 1097m 1 tablet daily  Aspirin 39m69m1 tablet daily  -Medications previously tried: n/a  -Current dietary patterns: *** -Current exercise habits: *** -Educated on {CCM HLD Counseling:25126} -{CCMPHARMDINTERVENTION:25122}  Prediabetes (A1c goal <6.5%) -{US controlled/uncontrolled:25276} -Last A1c 5.7% (10/07/2020) -Current meal patterns:  breakfast: ***  lunch: ***  dinner: *** snacks: *** drinks: *** -Current exercise: *** -Educated on {CCM DM  COUNSELING:25123} -Counseled to check feet daily and get yearly eye exams -{CCMPHARMDINTERVENTION:25122}  Asthma (Goal: control symptoms and prevent exacerbations) -{US controlled/uncontrolled:25276} -Current treatment  Albuterol 108 mcg/act inhaler - 2 puffs every 6 hours as needed  -Medications previously tried: ***  -Gold Grade: {CHL HP Upstream Pharm COPD Gold GradMOQHU:[id number]}rrent COPD Classification:  {CHL HP Upstream Pharm COPD Classification:[phone]} -MMRC/CAT score: *** -Pulmonary function testing: *** -Exacerbations requiring treatment in last 6 months: *** -Patient {Actions; denies-reports:120008} consistent use of maintenance inhaler -Frequency of rescue inhaler use: *** -Counseled on {CCMINHALERCOUNSELING:25121} -{CCMPHARMDINTERVENTION:25122}  Depression (Goal: Promotion of improved mental health) -{US controlled/uncontrolled:25276} -Current treatment: Venlafaxine 37.5mg 75m capsule daily  -Medications previously tried/failed: bupropion, alprazolam  -PHQ9: *** -Connected with *** for mental health support -Educated on {CCM mental health counseling:25127} -{CCMPHARMDINTERVENTION:25122}  Health Maintenance -Vaccine gaps: Shingles and COVID booster -Current therapy:  Sildenafil 20mg 6m5 tablets daily as needed  -Educated on {ccm supplement counseling:25128} -{CCM Patient satisfied:25129} -{CCMPHARMDINTERVENTION:25122}    Patient Goals/Self-Care Activities Patient will:  - {pharmacypatientgoals:24919}  Follow Up Plan: {CM FOLLOW UP PLAN:22241}  Current Chart Prep time = 23 minutes

## 2020-12-18 ENCOUNTER — Telehealth: Payer: Medicare Other

## 2020-12-18 NOTE — Progress Notes (Signed)
Cardiology Office Note  Date: 12/19/2020   ID: [name], DOB 05/07/1951, MRN [id number]  PCP:  Binnie Rail, MD  Cardiologist:  Rozann Lesches, MD Electrophysiologist:  None   Chief Complaint  Patient presents with   Cardiac follow-up    History of Present Illness: Danny Johnson is a 70 y.o. male last seen in January.  He is here today for a follow-up visit.  He has noticed fatigue and lack of stamina, no specific angina symptoms.  NYHA class II-III dyspnea.  Recent follow-up echocardiogram showed LVEF 55 to 60% with mild diastolic dysfunction, suspected relatively low flow severe aortic stenosis with mean gradient 31 mmHg and dimensionless index 0.19 this has gradually progressed over the last few studies.  We went over his medications, he is tolerating current regimen well, blood pressure controlled on lower dose Accupril.  I personally reviewed his ECG which shows sinus rhythm with PVC.  He does not report any palpitations or syncope.  Past Medical History:  Diagnosis Date   Anxiety    Aortic stenosis    Arthritis    Asthma    Bicuspid aortic valve    Coronary atherosclerosis of native coronary artery    Minimal, LVEF 60%   Essential hypertension, benign    Melanoma ([place]) 1993   Mixed hyperlipidemia    PVC's (premature ventricular contractions)     Past Surgical History:  Procedure Laterality Date   HERNIA REPAIR  12/2017   TONSILLECTOMY      Current Outpatient Medications  Medication Sig Dispense Refill   albuterol (VENTOLIN HFA) 108 (90 Base) MCG/ACT inhaler INHALE 2 PUFFS BY MOUTH INTO THE LUNGS EVERY 6 HOURS AS NEEDED FOR WHEEZING OR SHORTNESS OF BREATH 54 g 2   aspirin EC 81 MG tablet Take 1 tablet (81 mg total) by mouth every other day. 90 tablet 3   atorvastatin (LIPITOR) 10 MG tablet Take 1 tablet (10 mg total) by mouth daily. 90 tablet 3   quinapril (ACCUPRIL) 10 MG tablet Take 1 tablet (10 mg total) by mouth daily. 90 tablet 3   sildenafil  (REVATIO) 20 MG tablet TAKE 3-5 TABLETS BY MOUTH DAILY AS NEEDED 60 tablet 2   venlafaxine XR (EFFEXOR-XR) 37.5 MG 24 hr capsule Take 37.5 mg by mouth daily with breakfast.     No current facility-administered medications for this visit.   Allergies:  Dust mite extract   Social History: The patient  reports that he has quit smoking. His smoking use included cigarettes. He started smoking about 19 years ago. He has a 10.00 pack-year smoking history. He has never used smokeless tobacco. He reports current alcohol use of about 42.0 standard drinks of alcohol per week. He reports that he does not use drugs.   Family History: The patient's family history includes Dementia in his mother.   ROS: No syncope.  Physical Exam: VS:  BP 116/78   Pulse 70   Ht 6' 1.5" (1.867 m)   Wt 232 lb (105.2 kg)   SpO2 97%   BMI 30.19 kg/m , BMI Body mass index is 30.19 kg/m.  Wt Readings from Last 3 Encounters:  12/19/20 232 lb (105.2 kg)  12/02/20 233 lb (105.7 kg)  10/01/20 236 lb (107 kg)    General: Patient appears comfortable at rest. HEENT: Conjunctiva and lids normal, wearing a mask. Neck: Supple, no elevated JVP or carotid bruits, no thyromegaly. Lungs: Clear to auscultation, nonlabored breathing at rest. Cardiac: Regular rate and rhythm,  no S3, [id number] systolic murmur, no pericardial rub. Abdomen: Soft, nontender, bowel sounds present. Extremities: No pitting edema, distal pulses 2+. Skin: Warm and dry. Musculoskeletal: No kyphosis. Neuropsychiatric: Alert and oriented x3, affect grossly appropriate.  ECG:  An ECG dated 12/18/2019 was personally reviewed today and demonstrated:  Sinus rhythm with PVCs, poor R wave progression.  Recent Labwork: 10/07/2020: ALT 26; AST 20; BUN 11; Creatinine, Ser 0.75; Hemoglobin 16.5; Platelets 236.0; Potassium 3.9; Sodium 138     Component Value Date/Time   CHOL 133 10/07/2020 0916   TRIG 55.0 10/07/2020 0916   TRIG 89 10/25/2008 0000   HDL 54.80  10/07/2020 0916   CHOLHDL 2 10/07/2020 0916   VLDL 11.0 10/07/2020 0916   LDLCALC 68 10/07/2020 0916   LDLDIRECT 153.1 06/29/2012 0929    Other Studies Reviewed Today:  Echocardiogram 12/15/2020:  1. Left ventricular ejection fraction, by estimation, is 55 to 60%. The  left ventricle has normal function. The left ventricle has no regional  wall motion abnormalities. There is moderate left ventricular hypertrophy.  Left ventricular diastolic  parameters are consistent with Grade I diastolic dysfunction (impaired  relaxation).   2. Right ventricular systolic function is normal. The right ventricular  size is normal.   3. The mitral valve is normal in structure. No evidence of mitral valve  regurgitation. No evidence of mitral stenosis.   4. Mean gradient 31 mmHg, AVA VTI 0.59, DI 0.19, SVI 20. Criteria would  be consistent with paradoxical low flow low gradient severe aortic  stenosis, lower than expected gradient given decreased stroke volume  index. . The aortic valve is tricuspid. There   is severe calcifcation of the aortic valve. There is severe thickening of  the aortic valve. Aortic valve regurgitation is mild. Severe aortic valve  stenosis.   5. The inferior vena cava is normal in size with greater than 50%  respiratory variability, suggesting right atrial pressure of 3 mmHg.   Assessment and Plan:  1.  Functionally bicuspid aortic valve with severe calcific aortic stenosis by most recent echocardiogram.  Mean gradient 31 mmHg although dimensionless index 0.19 suggesting paradoxical low flow-low gradient stenosis.  He is symptomatic as discussed above, decreased stamina and NYHA class II-III dyspnea.  Plan is referral to the structural heart clinic for discussion of valve replacement with consideration of both open procedure and TAVR.  He has a history of previously documented minimal coronary atherosclerosis and this will need to be reassessed.  2.  Essential hypertension,  blood pressure is well controlled today on Accupril.  3.  Mixed hyperlipidemia, currently on Lipitor with LDL 68.  Medication Adjustments/Labs and Tests Ordered: Current medicines are reviewed at length with the patient today.  Concerns regarding medicines are outlined above.   Tests Ordered: Orders Placed This Encounter  Procedures   Ambulatory referral to Structural Heart/Valve Clinic (only at [facility])   EKG 12-Lead    Medication Changes: No orders of the defined types were placed in this encounter.   Disposition:  Follow up  6 months.  Signed, Satira Sark, MD, [facility] 12/19/2020 9:09 AM    [facility] at [facility] [address] [place], [place] [address] Phone: [phone]; Fax: [phone]

## 2020-12-19 ENCOUNTER — Ambulatory Visit: Payer: Medicare Other | Admitting: Cardiology

## 2020-12-19 ENCOUNTER — Other Ambulatory Visit: Payer: Self-pay

## 2020-12-19 ENCOUNTER — Encounter: Payer: Self-pay | Admitting: Cardiology

## 2020-12-19 VITALS — BP 116/78 | HR 70 | Ht 73.5 in | Wt 232.0 lb

## 2020-12-19 DIAGNOSIS — I10 Essential (primary) hypertension: Secondary | ICD-10-CM

## 2020-12-19 DIAGNOSIS — E782 Mixed hyperlipidemia: Secondary | ICD-10-CM

## 2020-12-19 DIAGNOSIS — Q231 Congenital insufficiency of aortic valve: Secondary | ICD-10-CM

## 2020-12-19 DIAGNOSIS — I350 Nonrheumatic aortic (valve) stenosis: Secondary | ICD-10-CM | POA: Diagnosis not present

## 2020-12-19 NOTE — Patient Instructions (Signed)
Medication Instructions:  Your physician recommends that you continue on your current medications as directed. Please refer to the Current Medication list given to you today.  *If you need a refill on your cardiac medications before your next appointment, please call your pharmacy*   Lab Work: None today  If you have labs (blood work) drawn today and your tests are completely normal, you will receive your results only by: [place] (if you have MyChart) OR A paper copy in the mail If you have any lab test that is abnormal or we need to change your treatment, we will call you to review the results.   Testing/Procedures: None today   Follow-Up: At [facility], you and your health needs are our priority.  As part of our continuing mission to provide you with exceptional heart care, we have created designated Provider Care Teams.  These Care Teams include your primary Cardiologist (physician) and Advanced Practice Providers (APPs -  Physician Assistants and Nurse Practitioners) who all work together to provide you with the care you need, when you need it.  We recommend signing up for the patient portal called "MyChart".  Sign up information is provided on this After Visit Summary.  MyChart is used to connect with patients for Virtual Visits (Telemedicine).  Patients are able to view lab/test results, encounter notes, upcoming appointments, etc.  Non-urgent messages can be sent to your provider as well.   To learn more about what you can do with MyChart, go to [email or web address].    Your next appointment:   6 month(s)  The format for your next appointment:   In Person  Provider:   Rozann Lesches, MD   Other Instructions You have been referred to [facility] in [place]. They will call you to schedule an appointment

## 2020-12-22 DIAGNOSIS — Z8719 Personal history of other diseases of the digestive system: Secondary | ICD-10-CM

## 2020-12-22 HISTORY — DX: Personal history of other diseases of the digestive system: Z87.19

## 2020-12-23 ENCOUNTER — Encounter: Payer: Self-pay | Admitting: Cardiovascular Disease

## 2020-12-23 ENCOUNTER — Ambulatory Visit: Payer: Medicare Other | Admitting: Cardiovascular Disease

## 2020-12-23 ENCOUNTER — Other Ambulatory Visit: Payer: Self-pay

## 2020-12-23 VITALS — BP 108/72 | HR 70 | Ht 73.5 in | Wt 233.0 lb

## 2020-12-23 DIAGNOSIS — I350 Nonrheumatic aortic (valve) stenosis: Secondary | ICD-10-CM | POA: Diagnosis not present

## 2020-12-23 NOTE — H&P (View-Only) (Signed)
[facility] Consult Note  Chief Complaint  Patient presents with   New Patient (Initial Visit)    Severe aortic stenosis   History of Present Illness: 70 yo male with history of anxiety, arthritis, asthma, HTN, hyperlipidemia and functionally bicuspid aortic valve with aortic stenosis who is here today as a new consult, referred by Dr. Domenic Polite, for further discussion of his aortic stenosis and AVR. He has been followed for his aortic valve disease for several years. He has recently had progressive fatigue and dizziness with mild dyspnea. Echo 12/15/20 with LVEF=55-60%, moderate LVH. The aortic valve appears to be tri-leaflet with thickened and calcified leaflets. Mean gradient 31 mmHg, peak gradient 49 mmHg, AVA 0.59 cm2, dimensionless index 0.19, SVI 20. This is consistent with paradoxical low flow, low gradient severe aortic stenosis. Cardiac cath in 2010 with mild CAD.   He tells me today that he plays golf frequently. He has had progressive fatigue with overall lack of stamina. He has had dizziness with near syncope while out in the heat. He has some dyspnea but no chest pain or LE edema. He lives in [place], [place]. He is divorced and here today with his girlfriend. He is retired from [employer]. He does see a dentist regularly and has no active dental issues.   Primary Care Physician: Binnie Rail, MD Primary Cardiologist: Domenic Polite Referring Cardiologist: Domenic Polite  Past Medical History:  Diagnosis Date   Anxiety    Aortic stenosis    Arthritis    Asthma    Bicuspid aortic valve    Coronary atherosclerosis of native coronary artery    Minimal, LVEF 60%   Essential hypertension, benign    Melanoma ([place]) 1993   Mixed hyperlipidemia    PVC's (premature ventricular contractions)     Past Surgical History:  Procedure Laterality Date   HERNIA REPAIR  12/2017   TONSILLECTOMY      Current Outpatient Medications  Medication Sig Dispense Refill    albuterol (VENTOLIN HFA) 108 (90 Base) MCG/ACT inhaler INHALE 2 PUFFS BY MOUTH INTO THE LUNGS EVERY 6 HOURS AS NEEDED FOR WHEEZING OR SHORTNESS OF BREATH 54 g 2   aspirin EC 81 MG tablet Take 1 tablet (81 mg total) by mouth every other day. 90 tablet 3   atorvastatin (LIPITOR) 10 MG tablet Take 1 tablet (10 mg total) by mouth daily. 90 tablet 3   quinapril (ACCUPRIL) 10 MG tablet Take 1 tablet (10 mg total) by mouth daily. 90 tablet 3   sildenafil (REVATIO) 20 MG tablet TAKE 3-5 TABLETS BY MOUTH DAILY AS NEEDED 60 tablet 2   venlafaxine XR (EFFEXOR-XR) 37.5 MG 24 hr capsule Take 37.5 mg by mouth daily with breakfast.     No current facility-administered medications for this visit.    Allergies  Allergen Reactions   Dust Mite Extract Shortness Of Breath and Other (See Comments)    Social History   Socioeconomic History   Marital status: Divorced    Spouse name: Not on file   Number of children: 3   Years of education: Not on file   Highest education level: Not on file  Occupational History   Occupation: Retired-[employer]  Tobacco Use   Smoking status: Some Days    Packs/day: 1.00    Years: 10.00    Pack years: 10.00    Types: Cigarettes    Start date: 07/21/2001   Smokeless tobacco: Never   Tobacco comments:    quit 2019  Vaping Use   Vaping Use: Never used  Substance and Sexual Activity   Alcohol use: Yes    Alcohol/week: 42.0 standard drinks    Types: 42 Standard drinks or equivalent per week    Comment: 4-6 drinks daily, beer/wine   Drug use: No   Sexual activity: Not on file  Other Topics Concern   Not on file  Social History Narrative   Not on file   Social Determinants of Health   Financial Resource Strain: Not on file  Food Insecurity: Not on file  Transportation Needs: Not on file  Physical Activity: Not on file  Stress: Not on file  Social Connections: Not on file  Intimate Partner Violence: Not on file    Family History   Problem Relation Age of Onset   Dementia Mother    Heart Problems Mother    Heart Problems Father     Review of Systems:  As stated in the HPI and otherwise negative.   BP 108/72   Pulse 70   Ht 6' 1.5" (1.867 m)   Wt 233 lb (105.7 kg)   SpO2 95%   BMI 30.32 kg/m   Physical Examination: General: Well developed, well nourished, NAD  HEENT: OP clear, mucus membranes moist  SKIN: warm, dry. No rashes. Neuro: No focal deficits  Musculoskeletal: Muscle strength 5/5 all ext  Psychiatric: Mood and affect normal  Neck: No JVD, no carotid bruits, no thyromegaly, no lymphadenopathy.  Lungs:Clear bilaterally, no wheezes, rhonci, crackles Cardiovascular: Regular rate and rhythm. Loud, harsh, late peaking systolic murmur.  Abdomen:Soft. Bowel sounds present. Non-tender.  Extremities: No lower extremity edema. Pulses are 2 + in the bilateral DP/PT.  EKG:  EKG is not ordered today. The ekg ordered today demonstrates  EKG from 12/19/20 with sinus rhythm  Echo 12/15/20:   1. Left ventricular ejection fraction, by estimation, is 55 to 60%. The  left ventricle has normal function. The left ventricle has no regional  wall motion abnormalities. There is moderate left ventricular hypertrophy.  Left ventricular diastolic  parameters are consistent with Grade I diastolic dysfunction (impaired  relaxation).   2. Right ventricular systolic function is normal. The right ventricular  size is normal.   3. The mitral valve is normal in structure. No evidence of mitral valve  regurgitation. No evidence of mitral stenosis.   4. Mean gradient 31 mmHg, AVA VTI 0.59, DI 0.19, SVI 20. Criteria would  be consistent with paradoxical low flow low gradient severe aortic  stenosis, lower than expected gradient given decreased stroke volume  index. . The aortic valve is tricuspid. There   is severe calcifcation of the aortic valve. There is severe thickening of  the aortic valve. Aortic valve regurgitation  is mild. Severe aortic valve  stenosis.   5. The inferior vena cava is normal in size with greater than 50%  respiratory variability, suggesting right atrial pressure of 3 mmHg.   FINDINGS   Left Ventricle: Left ventricular ejection fraction, by estimation, is 55  to 60%. The left ventricle has normal function. The left ventricle has no  regional wall motion abnormalities. The left ventricular internal cavity  size was normal in size. There is   moderate left ventricular hypertrophy. Left ventricular diastolic  parameters are consistent with Grade I diastolic dysfunction (impaired  relaxation).   Right Ventricle: The right ventricular size is normal. No increase in  right ventricular wall thickness. Right ventricular systolic function is  normal.   Left Atrium: Left atrial  size was normal in size.   Right Atrium: Right atrial size was normal in size.   Pericardium: There is no evidence of pericardial effusion.   Mitral Valve: The mitral valve is normal in structure. No evidence of  mitral valve regurgitation. No evidence of mitral valve stenosis.   Tricuspid Valve: The tricuspid valve is normal in structure. Tricuspid  valve regurgitation is not demonstrated. No evidence of tricuspid  stenosis.   Aortic Valve: Mean gradient 31 mmHg, AVA VTI 0.59, DI 0.19, SVI 20.  Criteria would be consistent with paradoxical low flow low gradient severe  aortic stenosis, lower than expected gradient given decreased stroke  volume index. The aortic valve is  tricuspid. There is severe calcifcation of the aortic valve. There is  severe thickening of the aortic valve. There is severe aortic valve  annular calcification. Aortic valve regurgitation is mild. Aortic  regurgitation PHT measures 503 msec. Severe aortic   stenosis is present. Aortic valve mean gradient measures 31.0 mmHg.  Aortic valve peak gradient measures 49.0 mmHg. Aortic valve area, by VTI  measures 0.59 cm.   Pulmonic Valve:  The pulmonic valve was not well visualized. Pulmonic valve  regurgitation is not visualized. No evidence of pulmonic stenosis.   Aorta: The aortic root is normal in size and structure.   Venous: The inferior vena cava is normal in size with greater than 50%  respiratory variability, suggesting right atrial pressure of 3 mmHg.   IAS/Shunts: No atrial level shunt detected by color flow Doppler.      LEFT VENTRICLE  PLAX 2D  LVIDd:         5.00 cm  Diastology  LVIDs:         2.70 cm  LV e' medial:    5.87 cm/s  LV PW:         1.40 cm  LV E/e' medial:  8.5  LV IVS:        1.30 cm  LV e' lateral:   7.94 cm/s  LVOT diam:     2.00 cm  LV E/e' lateral: 6.3  LV SV:         45  LV SV Index:   20  LVOT Area:     3.14 cm      RIGHT VENTRICLE  RV S prime:     11.50 cm/s  TAPSE (M-mode): 2.1 cm   LEFT ATRIUM             Index       RIGHT ATRIUM           Index  LA diam:        4.20 cm 1.82 cm/m  RA Area:     19.40 cm  LA Vol (A2C):   65.0 ml 28.17 ml/m RA Volume:   51.90 ml  22.49 ml/m  LA Vol (A4C):   63.3 ml 27.43 ml/m  LA Biplane Vol: 63.9 ml 27.69 ml/m   AORTIC VALVE  AV Area (Vmax):    0.59 cm  AV Area (Vmean):   0.52 cm  AV Area (VTI):     0.59 cm  AV Vmax:           350.00 cm/s  AV Vmean:          246.250 cm/s  AV VTI:            0.764 m  AV Peak Grad:      49.0 mmHg  AV Mean Grad:  31.0 mmHg  LVOT Vmax:         65.30 cm/s  LVOT Vmean:        40.500 cm/s  LVOT VTI:          0.144 m  LVOT/AV VTI ratio: 0.19  AI PHT:            503 msec     AORTA  Ao Root diam: 3.50 cm   MITRAL VALVE  MV Area (PHT): 2.95 cm    SHUNTS  MV Decel Time: 257 msec    Systemic VTI:  0.14 m  MV E velocity: 49.90 cm/s  Systemic Diam: 2.00 cm  MV A velocity: 65.80 cm/s  MV E/A ratio:  0.76   Recent Labs: 10/07/2020: ALT 26; BUN 11; Creatinine, Ser 0.75; Hemoglobin 16.5; Platelets 236.0; Potassium 3.9; Sodium 138   Wt Readings from Last 3 Encounters:  12/23/20 233 lb (105.7 kg)   12/19/20 232 lb (105.2 kg)  12/02/20 233 lb (105.7 kg)    Other studies Reviewed: Additional studies/ records that were reviewed today include: echo images, EKG, office nots Review of the above records demonstrates: severe AS  STS Risk Score: Procedure: AVR + CAB Risk of Mortality: 0.944% Renal Failure: 0.770% Permanent Stroke: 0.972% Prolonged Ventilation: 4.672% DSW Infection: 0.111% Reoperation: 3.446% Morbidity or Mortality: 8.119% Short Length of Stay: 57.286% Long Length of Stay: 2.661%   Assessment and Plan:   1. Severe Aortic Valve Stenosis: He has stage D3 paradoxical low flow, low gradient severe aortic stenosis. has severe, stage D aortic valve stenosis. I have personally reviewed the echo images. The aortic valve is thickened, calcified with limited leaflet mobility. I think he would benefit from AVR. He would be a candidate for surgical AVR or TAVR.  He is NYHA Class 2.   I have reviewed the natural history of aortic stenosis with the patient and their family members  who are present today. We have discussed the limitations of medical therapy and the poor prognosis associated with symptomatic aortic stenosis. We have reviewed potential treatment options, including palliative medical therapy, conventional surgical aortic valve replacement, and transcatheter aortic valve replacement. We discussed treatment options in the context of the patient's specific comorbid medical conditions.   He would like to proceed with planning for TAVR. I will arrange a right and left heart catheterization at [facility] 12/25/20 at 7:30am. Risks and benefits of the cath procedure and the valve procedure are reviewed with the patient. After the cath, he will have a cardiac CT, CTA of the chest/abdomen and pelvis, carotid artery dopplers, PT assessment and will then be referred to see one of the CT surgeons on our TAVR team.   Current medicines are reviewed at length with the patient today.  The  patient does not have concerns regarding medicines.  The following changes have been made:  no change  Labs/ tests ordered today include:   Orders Placed This Encounter  Procedures   Basic metabolic panel   CBC    Disposition:   F/U with the valve team.    Signed, Lauree Chandler, MD 12/23/2020 3:53 PM    [address], [place], [place]  [address] Phone: [phone]; Fax: [phone]

## 2020-12-23 NOTE — Patient Instructions (Signed)
Medication Instructions:  Your physician recommends that you continue on your current medications as directed. Please refer to the Current Medication list given to you today. *If you need a refill on your cardiac medications before your next appointment, please call your pharmacy*   Lab Work: Bmp, Cbc - Today   If you have labs (blood work) drawn today and your tests are completely normal, you will receive your results only by: [place] (if you have MyChart) OR A paper copy in the mail If you have any lab test that is abnormal or we need to change your treatment, we will call you to review the results.   Testing/Procedures: Your physician has requested that you have a cardiac catheterization. Cardiac catheterization is used to diagnose and/or treat various heart conditions. Doctors may recommend this procedure for a number of different reasons. The most common reason is to evaluate chest pain. Chest pain can be a symptom of coronary artery disease (CAD), and cardiac catheterization can show whether plaque is narrowing or blocking your heart's arteries. This procedure is also used to evaluate the valves, as well as measure the blood flow and oxygen levels in different parts of your heart. For further information please visit [email or web address]. Please follow instruction sheet, as given.  Follow-Up: Someone from the structural heart team with contact you with a follow up appointment   Other [place], [place] [address] Dept: [phone] Loc: [place]  12/23/2020  You are scheduled for a Cardiac Catheterization on Thursday, August 4 with Dr. Lauree Chandler.  1. Please arrive at the [facility] (Main Entrance A) at [facility]: [address] [place],  [address] at 5:30 AM (This time is two hours before your  procedure to ensure your preparation). Free valet parking service is available.   Special note: Every effort is made to have your procedure done on time. Please understand that emergencies sometimes delay scheduled procedures.  2. Diet: Do not eat solid foods after midnight.  The patient may have clear liquids until 5am upon the day of the procedure.  3. Medication instructions in preparation for your procedure:  On the morning of your procedure, take your Aspirin and any morning medicines NOT listed above.  You may use sips of water.  5. Plan for one night stay--bring personal belongings. 6. Bring a current list of your medications and current insurance cards. 7. You MUST have a responsible person to drive you home. 8. Someone MUST be with you the first 24 hours after you arrive home or your discharge will be delayed. 9. Please wear clothes that are easy to get on and off and wear slip-on shoes.  Thank you for allowing [place] to care for you!   -- [facility]

## 2020-12-23 NOTE — Progress Notes (Signed)
[facility] Consult Note  Chief Complaint  Patient presents with   New Patient (Initial Visit)    Severe aortic stenosis   History of Present Illness: 70 yo male with history of anxiety, arthritis, asthma, HTN, hyperlipidemia and functionally bicuspid aortic valve with aortic stenosis who is here today as a new consult, referred by Dr. Domenic Polite, for further discussion of his aortic stenosis and AVR. He has been followed for his aortic valve disease for several years. He has recently had progressive fatigue and dizziness with mild dyspnea. Echo 12/15/20 with LVEF=55-60%, moderate LVH. The aortic valve appears to be tri-leaflet with thickened and calcified leaflets. Mean gradient 31 mmHg, peak gradient 49 mmHg, AVA 0.59 cm2, dimensionless index 0.19, SVI 20. This is consistent with paradoxical low flow, low gradient severe aortic stenosis. Cardiac cath in 2010 with mild CAD.   He tells me today that he plays golf frequently. He has had progressive fatigue with overall lack of stamina. He has had dizziness with near syncope while out in the heat. He has some dyspnea but no chest pain or LE edema. He lives in [place], [place]. He is divorced and here today with his girlfriend. He is retired from [employer]. He does see a dentist regularly and has no active dental issues.   Primary Care Physician: Binnie Rail, MD Primary Cardiologist: Domenic Polite Referring Cardiologist: Domenic Polite  Past Medical History:  Diagnosis Date   Anxiety    Aortic stenosis    Arthritis    Asthma    Bicuspid aortic valve    Coronary atherosclerosis of native coronary artery    Minimal, LVEF 60%   Essential hypertension, benign    Melanoma ([place]) 1993   Mixed hyperlipidemia    PVC's (premature ventricular contractions)     Past Surgical History:  Procedure Laterality Date   HERNIA REPAIR  12/2017   TONSILLECTOMY      Current Outpatient Medications  Medication Sig Dispense Refill    albuterol (VENTOLIN HFA) 108 (90 Base) MCG/ACT inhaler INHALE 2 PUFFS BY MOUTH INTO THE LUNGS EVERY 6 HOURS AS NEEDED FOR WHEEZING OR SHORTNESS OF BREATH 54 g 2   aspirin EC 81 MG tablet Take 1 tablet (81 mg total) by mouth every other day. 90 tablet 3   atorvastatin (LIPITOR) 10 MG tablet Take 1 tablet (10 mg total) by mouth daily. 90 tablet 3   quinapril (ACCUPRIL) 10 MG tablet Take 1 tablet (10 mg total) by mouth daily. 90 tablet 3   sildenafil (REVATIO) 20 MG tablet TAKE 3-5 TABLETS BY MOUTH DAILY AS NEEDED 60 tablet 2   venlafaxine XR (EFFEXOR-XR) 37.5 MG 24 hr capsule Take 37.5 mg by mouth daily with breakfast.     No current facility-administered medications for this visit.    Allergies  Allergen Reactions   Dust Mite Extract Shortness Of Breath and Other (See Comments)    Social History   Socioeconomic History   Marital status: Divorced    Spouse name: Not on file   Number of children: 3   Years of education: Not on file   Highest education level: Not on file  Occupational History   Occupation: Retired-S[employer]  Tobacco Use   Smoking status: Some Days    Packs/day: 1.00    Years: 10.00    Pack years: 10.00    Types: Cigarettes    Start date: 07/21/2001   Smokeless tobacco: Never   Tobacco comments:    quit 2019  Vaping Use   Vaping Use: Never used  Substance and Sexual Activity   Alcohol use: Yes    Alcohol/week: 42.0 standard drinks    Types: 42 Standard drinks or equivalent per week    Comment: 4-6 drinks daily, beer/wine   Drug use: No   Sexual activity: Not on file  Other Topics Concern   Not on file  Social History Narrative   Not on file   Social Determinants of Health   Financial Resource Strain: Not on file  Food Insecurity: Not on file  Transportation Needs: Not on file  Physical Activity: Not on file  Stress: Not on file  Social Connections: Not on file  Intimate Partner Violence: Not on file    Family History   Problem Relation Age of Onset   Dementia Mother    Heart Problems Mother    Heart Problems Father     Review of Systems:  As stated in the HPI and otherwise negative.   BP 108/72   Pulse 70   Ht 6' 1.5" (1.867 m)   Wt 233 lb (105.7 kg)   SpO2 95%   BMI 30.32 kg/m   Physical Examination: General: Well developed, well nourished, NAD  HEENT: OP clear, mucus membranes moist  SKIN: warm, dry. No rashes. Neuro: No focal deficits  Musculoskeletal: Muscle strength 5/5 all ext  Psychiatric: Mood and affect normal  Neck: No JVD, no carotid bruits, no thyromegaly, no lymphadenopathy.  Lungs:Clear bilaterally, no wheezes, rhonci, crackles Cardiovascular: Regular rate and rhythm. Loud, harsh, late peaking systolic murmur.  Abdomen:Soft. Bowel sounds present. Non-tender.  Extremities: No lower extremity edema. Pulses are 2 + in the bilateral DP/PT.  EKG:  EKG is not ordered today. The ekg ordered today demonstrates  EKG from 12/19/20 with sinus rhythm  Echo 12/15/20:   1. Left ventricular ejection fraction, by estimation, is 55 to 60%. The  left ventricle has normal function. The left ventricle has no regional  wall motion abnormalities. There is moderate left ventricular hypertrophy.  Left ventricular diastolic  parameters are consistent with Grade I diastolic dysfunction (impaired  relaxation).   2. Right ventricular systolic function is normal. The right ventricular  size is normal.   3. The mitral valve is normal in structure. No evidence of mitral valve  regurgitation. No evidence of mitral stenosis.   4. Mean gradient 31 mmHg, AVA VTI 0.59, DI 0.19, SVI 20. Criteria would  be consistent with paradoxical low flow low gradient severe aortic  stenosis, lower than expected gradient given decreased stroke volume  index. . The aortic valve is tricuspid. There   is severe calcifcation of the aortic valve. There is severe thickening of  the aortic valve. Aortic valve regurgitation  is mild. Severe aortic valve  stenosis.   5. The inferior vena cava is normal in size with greater than 50%  respiratory variability, suggesting right atrial pressure of 3 mmHg.   FINDINGS   Left Ventricle: Left ventricular ejection fraction, by estimation, is 55  to 60%. The left ventricle has normal function. The left ventricle has no  regional wall motion abnormalities. The left ventricular internal cavity  size was normal in size. There is   moderate left ventricular hypertrophy. Left ventricular diastolic  parameters are consistent with Grade I diastolic dysfunction (impaired  relaxation).   Right Ventricle: The right ventricular size is normal. No increase in  right ventricular wall thickness. Right ventricular systolic function is  normal.   Left Atrium: Left atrial  size was normal in size.   Right Atrium: Right atrial size was normal in size.   Pericardium: There is no evidence of pericardial effusion.   Mitral Valve: The mitral valve is normal in structure. No evidence of  mitral valve regurgitation. No evidence of mitral valve stenosis.   Tricuspid Valve: The tricuspid valve is normal in structure. Tricuspid  valve regurgitation is not demonstrated. No evidence of tricuspid  stenosis.   Aortic Valve: Mean gradient 31 mmHg, AVA VTI 0.59, DI 0.19, SVI 20.  Criteria would be consistent with paradoxical low flow low gradient severe  aortic stenosis, lower than expected gradient given decreased stroke  volume index. The aortic valve is  tricuspid. There is severe calcifcation of the aortic valve. There is  severe thickening of the aortic valve. There is severe aortic valve  annular calcification. Aortic valve regurgitation is mild. Aortic  regurgitation PHT measures 503 msec. Severe aortic   stenosis is present. Aortic valve mean gradient measures 31.0 mmHg.  Aortic valve peak gradient measures 49.0 mmHg. Aortic valve area, by VTI  measures 0.59 cm.   Pulmonic Valve:  The pulmonic valve was not well visualized. Pulmonic valve  regurgitation is not visualized. No evidence of pulmonic stenosis.   Aorta: The aortic root is normal in size and structure.   Venous: The inferior vena cava is normal in size with greater than 50%  respiratory variability, suggesting right atrial pressure of 3 mmHg.   IAS/Shunts: No atrial level shunt detected by color flow Doppler.      LEFT VENTRICLE  PLAX 2D  LVIDd:         5.00 cm  Diastology  LVIDs:         2.70 cm  LV e' medial:    5.87 cm/s  LV PW:         1.40 cm  LV E/e' medial:  8.5  LV IVS:        1.30 cm  LV e' lateral:   7.94 cm/s  LVOT diam:     2.00 cm  LV E/e' lateral: 6.3  LV SV:         45  LV SV Index:   20  LVOT Area:     3.14 cm      RIGHT VENTRICLE  RV S prime:     11.50 cm/s  TAPSE (M-mode): 2.1 cm   LEFT ATRIUM             Index       RIGHT ATRIUM           Index  LA diam:        4.20 cm 1.82 cm/m  RA Area:     19.40 cm  LA Vol (A2C):   65.0 ml 28.17 ml/m RA Volume:   51.90 ml  22.49 ml/m  LA Vol (A4C):   63.3 ml 27.43 ml/m  LA Biplane Vol: 63.9 ml 27.69 ml/m   AORTIC VALVE  AV Area (Vmax):    0.59 cm  AV Area (Vmean):   0.52 cm  AV Area (VTI):     0.59 cm  AV Vmax:           350.00 cm/s  AV Vmean:          246.250 cm/s  AV VTI:            0.764 m  AV Peak Grad:      49.0 mmHg  AV Mean Grad:  31.0 mmHg  LVOT Vmax:         65.30 cm/s  LVOT Vmean:        40.500 cm/s  LVOT VTI:          0.144 m  LVOT/AV VTI ratio: 0.19  AI PHT:            503 msec     AORTA  Ao Root diam: 3.50 cm   MITRAL VALVE  MV Area (PHT): 2.95 cm    SHUNTS  MV Decel Time: 257 msec    Systemic VTI:  0.14 m  MV E velocity: 49.90 cm/s  Systemic Diam: 2.00 cm  MV A velocity: 65.80 cm/s  MV E/A ratio:  0.76   Recent Labs: 10/07/2020: ALT 26; BUN 11; Creatinine, Ser 0.75; Hemoglobin 16.5; Platelets 236.0; Potassium 3.9; Sodium 138   Wt Readings from Last 3 Encounters:  12/23/20 233 lb (105.7 kg)   12/19/20 232 lb (105.2 kg)  12/02/20 233 lb (105.7 kg)    Other studies Reviewed: Additional studies/ records that were reviewed today include: echo images, EKG, office nots Review of the above records demonstrates: severe AS  STS Risk Score: Procedure: AVR + CAB Risk of Mortality: 0.944% Renal Failure: 0.770% Permanent Stroke: 0.972% Prolonged Ventilation: 4.672% DSW Infection: 0.111% Reoperation: 3.446% Morbidity or Mortality: 8.119% Short Length of Stay: 57.286% Long Length of Stay: 2.661%   Assessment and Plan:   1. Severe Aortic Valve Stenosis: He has stage D3 paradoxical low flow, low gradient severe aortic stenosis. has severe, stage D aortic valve stenosis. I have personally reviewed the echo images. The aortic valve is thickened, calcified with limited leaflet mobility. I think he would benefit from AVR. He would be a candidate for surgical AVR or TAVR.  He is NYHA Class 2.   I have reviewed the natural history of aortic stenosis with the patient and their family members  who are present today. We have discussed the limitations of medical therapy and the poor prognosis associated with symptomatic aortic stenosis. We have reviewed potential treatment options, including palliative medical therapy, conventional surgical aortic valve replacement, and transcatheter aortic valve replacement. We discussed treatment options in the context of the patient's specific comorbid medical conditions.   He would like to proceed with planning for TAVR. I will arrange a right and left heart catheterization at [facility] 12/25/20 at 7:30am. Risks and benefits of the cath procedure and the valve procedure are reviewed with the patient. After the cath, he will have a cardiac CT, CTA of the chest/abdomen and pelvis, carotid artery dopplers, PT assessment and will then be referred to see one of the CT surgeons on our TAVR team.   Current medicines are reviewed at length with the patient today.  The  patient does not have concerns regarding medicines.  The following changes have been made:  no change  Labs/ tests ordered today include:   Orders Placed This Encounter  Procedures   Basic metabolic panel   CBC    Disposition:   F/U with the valve team.    Signed, Lauree Chandler, MD 12/23/2020 3:53 PM    [facility] [address], [place], [place]  [address] Phone: [phone]; Fax: [phone]

## 2020-12-24 ENCOUNTER — Telehealth: Payer: Self-pay

## 2020-12-24 ENCOUNTER — Telehealth: Payer: Self-pay | Admitting: *Deleted

## 2020-12-24 LAB — CBC
Hematocrit: 48.4 % (ref 37.5–51.0)
Hemoglobin: 16.4 g/dL (ref 13.0–17.7)
MCH: 34.7 pg — ABNORMAL HIGH (ref 26.6–33.0)
MCHC: 33.9 g/dL (ref 31.5–35.7)
MCV: 103 fL — ABNORMAL HIGH (ref 79–97)
Platelets: 259 10*3/uL (ref 150–450)
RBC: 4.72 x10E6/uL (ref 4.14–5.80)
RDW: 12.1 % (ref 11.6–15.4)
WBC: 7.1 10*3/uL (ref 3.4–10.8)

## 2020-12-24 LAB — BASIC METABOLIC PANEL
BUN/Creatinine Ratio: 17 (ref 10–24)
BUN: 13 mg/dL (ref 8–27)
CO2: 21 mmol/L (ref 20–29)
Calcium: 9.4 mg/dL (ref 8.6–10.2)
Chloride: 100 mmol/L (ref 96–106)
Creatinine, Ser: 0.77 mg/dL (ref 0.76–1.27)
Glucose: 83 mg/dL (ref 65–99)
Potassium: 4.3 mmol/L (ref 3.5–5.2)
Sodium: 139 mmol/L (ref 134–144)
eGFR: 96 mL/min/{1.73_m2} (ref >59)

## 2020-12-24 NOTE — Telephone Encounter (Signed)
The patient reports he had dental abscess removed today. He states the dentist reported "all the infection was removed." Danny Johnson is scheduled for heart catheterization tomorrow and would like to postpone.  Offered to reschedule with another interventionalist but the patient declined, despite knowing Dr. Angelena Form will be out of the cath lab for 2 weeks.  Rescheduled the patient to 8/22 with Dr. Angelena Form. He was grateful for call and agrees with plan.

## 2020-12-24 NOTE — Telephone Encounter (Addendum)
Cardiac catheterization scheduled at [facility] for: Thursday December 25, 2020 7:30 AM [facility] Main Entrance A [facility]) at: 5:30AM   No solid food after midnight prior to cath, clear liquids until 5 AM day of procedure.  Medication instructions: -AM meds can be  taken pre-cath with sips of water including:    aspirin 81 mg   Confirmed patient has responsible adult to drive home post procedure and be with patient first 24 hours after arriving home.  Patients are allowed one visitor in the waiting room during the time they are at the hospital for their procedure. Both patient and visitor must wear a mask once they enter the hospital.   Patient reports does not currently have any symptoms concerning for COVID-19 and no household members with COVID-19 like illness.          Reviewed procedure/mask/visitor instructions with patient.

## 2020-12-29 ENCOUNTER — Other Ambulatory Visit: Payer: Self-pay | Admitting: Internal Medicine

## 2021-01-05 ENCOUNTER — Other Ambulatory Visit: Payer: Self-pay

## 2021-01-05 DIAGNOSIS — I350 Nonrheumatic aortic (valve) stenosis: Secondary | ICD-10-CM

## 2021-01-08 ENCOUNTER — Telehealth: Payer: Self-pay | Admitting: *Deleted

## 2021-01-08 NOTE — Telephone Encounter (Signed)
Cardiac catheterization scheduled at [facility] for: Monday January 12, 2021 [facility] Main Entrance A [facility]) at: 7 AM   No solid food after midnight prior to cath, clear liquids until 5 AM day of procedure.   Morning medications can be taken pre-cath with sips of water including aspirin 81 mg.    Confirmed patient has responsible adult to drive home post procedure and be with patient first 24 hours after arriving home.  Patients are allowed one visitor in the waiting room during the time they are at the hospital for their procedure. Both patient and visitor must wear a mask once they enter the hospital.   Patient reports does not currently have any symptoms concerning for COVID-19 and no household members with COVID-19 like illness.      Reviewed procedure/mask/visitor instructions with patient.

## 2021-01-12 ENCOUNTER — Ambulatory Visit (HOSPITAL_COMMUNITY)
Admission: RE | Admit: 2021-01-12 | Discharge: 2021-01-12 | Disposition: A | Discharge status: Home / Self Care | Payer: Medicare Other | Attending: Cardiovascular Disease | Admitting: Cardiovascular Disease

## 2021-01-12 ENCOUNTER — Encounter (HOSPITAL_COMMUNITY)
Admission: RE | Disposition: A | Discharge status: Home / Self Care | Payer: Self-pay | Source: Home / Self Care | Attending: Cardiovascular Disease

## 2021-01-12 ENCOUNTER — Encounter (HOSPITAL_COMMUNITY): Payer: Self-pay | Admitting: Cardiovascular Disease

## 2021-01-12 ENCOUNTER — Other Ambulatory Visit: Payer: Self-pay

## 2021-01-12 DIAGNOSIS — Z888 Allergy status to other drugs, medicaments and biological substances status: Secondary | ICD-10-CM | POA: Diagnosis not present

## 2021-01-12 DIAGNOSIS — E782 Mixed hyperlipidemia: Secondary | ICD-10-CM | POA: Diagnosis not present

## 2021-01-12 DIAGNOSIS — F17210 Nicotine dependence, cigarettes, uncomplicated: Secondary | ICD-10-CM | POA: Insufficient documentation

## 2021-01-12 DIAGNOSIS — F419 Anxiety disorder, unspecified: Secondary | ICD-10-CM | POA: Diagnosis not present

## 2021-01-12 DIAGNOSIS — Z79899 Other long term (current) drug therapy: Secondary | ICD-10-CM | POA: Insufficient documentation

## 2021-01-12 DIAGNOSIS — Z7982 Long term (current) use of aspirin: Secondary | ICD-10-CM | POA: Diagnosis not present

## 2021-01-12 DIAGNOSIS — I350 Nonrheumatic aortic (valve) stenosis: Secondary | ICD-10-CM | POA: Diagnosis not present

## 2021-01-12 DIAGNOSIS — I352 Nonrheumatic aortic (valve) stenosis with insufficiency: Secondary | ICD-10-CM | POA: Diagnosis not present

## 2021-01-12 DIAGNOSIS — I2510 Atherosclerotic heart disease of native coronary artery without angina pectoris: Secondary | ICD-10-CM | POA: Diagnosis not present

## 2021-01-12 DIAGNOSIS — J45909 Unspecified asthma, uncomplicated: Secondary | ICD-10-CM | POA: Insufficient documentation

## 2021-01-12 HISTORY — PX: RIGHT/LEFT HEART CATH AND CORONARY ANGIOGRAPHY: CATH118266

## 2021-01-12 LAB — POCT I-STAT EG7
Acid-Base Excess: 1 mmol/L (ref 0.0–2.0)
Bicarbonate: 27.3 mmol/L (ref 20.0–28.0)
Calcium, Ion: 1.16 mmol/L (ref 1.15–1.40)
HCT: 45 % (ref 39.0–52.0)
Hemoglobin: 15.3 g/dL (ref 13.0–17.0)
O2 Saturation: 79 %
Potassium: 3.9 mmol/L (ref 3.5–5.1)
Sodium: 138 mmol/L (ref 135–145)
TCO2: 29 mmol/L (ref 22–32)
pCO2, Ven: 47.8 mmHg (ref 44.0–60.0)
pH, Ven: 7.364 (ref 7.250–7.430)
pO2, Ven: 45 mmHg (ref 32.0–45.0)

## 2021-01-12 LAB — POCT I-STAT 7, (LYTES, BLD GAS, ICA,H+H)
Acid-Base Excess: 0 mmol/L (ref 0.0–2.0)
Bicarbonate: 24.7 mmol/L (ref 20.0–28.0)
Calcium, Ion: 0.98 mmol/L — ABNORMAL LOW (ref 1.15–1.40)
HCT: 42 % (ref 39.0–52.0)
Hemoglobin: 14.3 g/dL (ref 13.0–17.0)
O2 Saturation: 99 %
Potassium: 3.5 mmol/L (ref 3.5–5.1)
Sodium: 141 mmol/L (ref 135–145)
TCO2: 26 mmol/L (ref 22–32)
pCO2 arterial: 41.2 mmHg (ref 32.0–48.0)
pH, Arterial: 7.386 (ref 7.350–7.450)
pO2, Arterial: 137 mmHg — ABNORMAL HIGH (ref 83.0–108.0)

## 2021-01-12 SURGERY — RIGHT/LEFT HEART CATH AND CORONARY ANGIOGRAPHY
Anesthesia: LOCAL

## 2021-01-12 MED ORDER — SODIUM CHLORIDE 0.9 % WEIGHT BASED INFUSION: 1.0000 mL/kg/h | INTRAVENOUS | Status: DC

## 2021-01-12 MED ORDER — SODIUM CHLORIDE 0.9% FLUSH: 3.0000 mL | Freq: Two times a day (BID) | INTRAVENOUS | Status: DC

## 2021-01-12 MED ORDER — LABETALOL HCL 5 MG/ML IV SOLN: 10.0000 mg | INTRAVENOUS | Status: DC | PRN

## 2021-01-12 MED ORDER — HEPARIN SODIUM (PORCINE) 1000 UNIT/ML IJ SOLN
INTRAMUSCULAR | Status: AC
  Filled 2021-01-12: qty 1

## 2021-01-12 MED ORDER — FENTANYL CITRATE (PF) 100 MCG/2ML IJ SOLN
INTRAMUSCULAR | Status: DC | PRN
  Administered 2021-01-12: 50 ug via INTRAVENOUS

## 2021-01-12 MED ORDER — SODIUM CHLORIDE 0.9 % IV SOLN: INTRAVENOUS | Status: DC

## 2021-01-12 MED ORDER — SODIUM CHLORIDE 0.9 % WEIGHT BASED INFUSION
3.0000 mL/kg/h | INTRAVENOUS | Status: AC
  Administered 2021-01-12: 3 mL/kg/h via INTRAVENOUS

## 2021-01-12 MED ORDER — MIDAZOLAM HCL 2 MG/2ML IJ SOLN
INTRAMUSCULAR | Status: DC | PRN
  Administered 2021-01-12: 2 mg via INTRAVENOUS

## 2021-01-12 MED ORDER — SODIUM CHLORIDE 0.9% FLUSH: 3.0000 mL | INTRAVENOUS | Status: DC | PRN

## 2021-01-12 MED ORDER — MIDAZOLAM HCL 2 MG/2ML IJ SOLN
INTRAMUSCULAR | Status: AC
  Filled 2021-01-12: qty 2

## 2021-01-12 MED ORDER — SODIUM CHLORIDE 0.9 % IV SOLN: 250.0000 mL | INTRAVENOUS | Status: DC | PRN

## 2021-01-12 MED ORDER — VERAPAMIL HCL 2.5 MG/ML IV SOLN
INTRAVENOUS | Status: AC
  Filled 2021-01-12: qty 2

## 2021-01-12 MED ORDER — ONDANSETRON HCL 4 MG/2ML IJ SOLN: 4.0000 mg | Freq: Four times a day (QID) | INTRAMUSCULAR | Status: DC | PRN

## 2021-01-12 MED ORDER — IOHEXOL 350 MG/ML SOLN
INTRAVENOUS | Status: DC | PRN
  Administered 2021-01-12: 80 mL

## 2021-01-12 MED ORDER — VERAPAMIL HCL 2.5 MG/ML IV SOLN
INTRAVENOUS | Status: DC | PRN
  Administered 2021-01-12: 10 mL via INTRA_ARTERIAL

## 2021-01-12 MED ORDER — LIDOCAINE HCL (PF) 1 % IJ SOLN
INTRAMUSCULAR | Status: AC
  Filled 2021-01-12: qty 30

## 2021-01-12 MED ORDER — FENTANYL CITRATE (PF) 100 MCG/2ML IJ SOLN
INTRAMUSCULAR | Status: AC
  Filled 2021-01-12: qty 2

## 2021-01-12 MED ORDER — ASPIRIN 81 MG PO CHEW: 81.0000 mg | CHEWABLE_TABLET | ORAL | Status: DC

## 2021-01-12 MED ORDER — ACETAMINOPHEN 325 MG PO TABS: 650.0000 mg | ORAL_TABLET | ORAL | Status: DC | PRN

## 2021-01-12 MED ORDER — HEPARIN (PORCINE) IN NACL 1000-0.9 UT/500ML-% IV SOLN
INTRAVENOUS | Status: DC | PRN
  Administered 2021-01-12 (×2): 500 mL

## 2021-01-12 MED ORDER — HEPARIN (PORCINE) IN NACL 1000-0.9 UT/500ML-% IV SOLN
INTRAVENOUS | Status: AC
  Filled 2021-01-12: qty 1000

## 2021-01-12 MED ORDER — HEPARIN SODIUM (PORCINE) 1000 UNIT/ML IJ SOLN
INTRAMUSCULAR | Status: DC | PRN
  Administered 2021-01-12: 5000 [IU] via INTRAVENOUS

## 2021-01-12 MED ORDER — HYDRALAZINE HCL 20 MG/ML IJ SOLN: 10.0000 mg | INTRAMUSCULAR | Status: DC | PRN

## 2021-01-12 MED ORDER — LIDOCAINE HCL (PF) 1 % IJ SOLN
INTRAMUSCULAR | Status: DC | PRN
  Administered 2021-01-12 (×2): 2 mL

## 2021-01-12 SURGICAL SUPPLY — 15 items
CATH 5FR JL3.5 JR4 ANG PIG MP (CATHETERS) ×2 IMPLANT
CATH BALLN WEDGE 5F 110CM (CATHETERS) ×2 IMPLANT
CATH INFINITI 5FR AL1 (CATHETERS) ×2 IMPLANT
DEVICE RAD COMP TR BAND LRG (VASCULAR PRODUCTS) ×2 IMPLANT
GLIDESHEATH SLEND SS 6F .021 (SHEATH) ×2 IMPLANT
GUIDEWIRE INQWIRE 1.5J.035X260 (WIRE) ×1 IMPLANT
INQWIRE 1.5J .035X260CM (WIRE) ×2
KIT HEART LEFT (KITS) ×2 IMPLANT
PACK CARDIAC CATHETERIZATION (CUSTOM PROCEDURE TRAY) ×2 IMPLANT
SHEATH GLIDE SLENDER 4/5FR (SHEATH) ×2 IMPLANT
SHEATH PROBE COVER 6X72 (BAG) ×2 IMPLANT
TRANSDUCER W/STOPCOCK (MISCELLANEOUS) ×2 IMPLANT
TUBING CIL FLEX 10 FLL-RA (TUBING) ×2 IMPLANT
WIRE EMERALD ST .035X150CM (WIRE) ×2 IMPLANT
WIRE MICROINTRODUCER 60CM (WIRE) ×2 IMPLANT

## 2021-01-12 NOTE — Interval H&P Note (Signed)
History and Physical Interval Note:  01/12/2021 7:42 AM  Danny Johnson  has presented today for surgery, with the diagnosis of SEVERE AS.  The various methods of treatment have been discussed with the patient and family. After consideration of risks, benefits and other options for treatment, the patient has consented to  Procedure(s): RIGHT/LEFT HEART CATH AND CORONARY ANGIOGRAPHY (N/A) as a surgical intervention.  The patient's history has been reviewed, patient examined, no change in status, stable for surgery.  I have reviewed the patient's chart and labs.  Questions were answered to the patient's satisfaction.    Cath Lab Visit (complete for each Cath Lab visit)  Clinical Evaluation Leading to the Procedure:   ACS: No.  Non-ACS:    Anginal Classification: CCS II  Anti-ischemic medical therapy: No Therapy  Non-Invasive Test Results: No non-invasive testing performed  Prior CABG: No previous CABG        Lauree Chandler

## 2021-01-15 ENCOUNTER — Other Ambulatory Visit: Payer: Self-pay

## 2021-01-15 ENCOUNTER — Ambulatory Visit (HOSPITAL_COMMUNITY)
Admission: RE | Admit: 2021-01-15 | Discharge: 2021-01-15 | Disposition: A | Discharge status: Home / Self Care | Payer: Medicare Other | Source: Ambulatory Visit | Attending: Internal Medicine | Admitting: Internal Medicine

## 2021-01-15 ENCOUNTER — Ambulatory Visit (HOSPITAL_COMMUNITY)
Admission: RE | Admit: 2021-01-15 | Discharge: 2021-01-15 | Disposition: A | Discharge status: Home / Self Care | Payer: Medicare Other | Source: Ambulatory Visit | Attending: Cardiovascular Disease | Admitting: Cardiovascular Disease

## 2021-01-15 DIAGNOSIS — K5730 Diverticulosis of large intestine without perforation or abscess without bleeding: Secondary | ICD-10-CM | POA: Diagnosis not present

## 2021-01-15 DIAGNOSIS — I700 Atherosclerosis of aorta: Secondary | ICD-10-CM | POA: Diagnosis not present

## 2021-01-15 DIAGNOSIS — I350 Nonrheumatic aortic (valve) stenosis: Secondary | ICD-10-CM

## 2021-01-15 MED ORDER — METOPROLOL TARTRATE 5 MG/5ML IV SOLN
INTRAVENOUS | Status: AC
  Administered 2021-01-15: 5 mg via INTRAVENOUS
  Filled 2021-01-15: qty 5

## 2021-01-15 MED ORDER — METOPROLOL TARTRATE 5 MG/5ML IV SOLN: 5.0000 mg | Freq: Once | INTRAVENOUS | Status: AC

## 2021-01-15 MED ORDER — IOHEXOL 350 MG/ML SOLN
100.0000 mL | Freq: Once | INTRAVENOUS | Status: AC | PRN
  Administered 2021-01-15: 100 mL via INTRAVENOUS

## 2021-01-15 MED ORDER — METOPROLOL TARTRATE 5 MG/5ML IV SOLN
INTRAVENOUS | Status: AC
  Filled 2021-01-15: qty 5

## 2021-01-28 ENCOUNTER — Encounter: Payer: Medicare Other | Admitting: Surgery

## 2021-01-28 ENCOUNTER — Other Ambulatory Visit: Payer: Self-pay

## 2021-01-28 ENCOUNTER — Ambulatory Visit: Payer: Medicare Other | Attending: Cardiovascular Disease

## 2021-01-28 DIAGNOSIS — I350 Nonrheumatic aortic (valve) stenosis: Secondary | ICD-10-CM

## 2021-01-28 DIAGNOSIS — R262 Difficulty in walking, not elsewhere classified: Secondary | ICD-10-CM | POA: Diagnosis not present
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
## 2021-01-29 NOTE — Pre-Procedure Instructions (Signed)
Surgical Instructions    Your procedure is scheduled on Tuesday, September 13th, 2022.  Report to [facility] Main Entrance "A" at 08:45 A.M., then check in with the Admitting office.  Call this number if you have problems the morning of surgery:  [phone]   If you have any questions prior to your surgery date call [phone]: Open Monday-Friday 8am-4pm    Remember:  Do not eat or drink after midnight the night before your surgery          Follow medication instructions given by surgeon's office  Do not wear jewelry Do not wear lotions, powders, colognes, or deodorant. Men may shave face and neck. Do not bring valuables to the hospital.             [facility] is not responsible for any belongings or valuables.  Do NOT Smoke (Tobacco/Vaping)  24 hours prior to your procedure If you use a CPAP at night, you may bring your mask for your overnight stay.   Contacts, glasses, dentures or bridgework may not be worn into surgery, please bring cases for these belongings   For patients admitted to the hospital, discharge time will be determined by your treatment team.   Patients discharged the day of surgery will not be allowed to drive home, and someone needs to stay with them for 24 hours.  ONLY 1 SUPPORT PERSON MAY BE PRESENT WHILE YOU ARE IN SURGERY. IF YOU ARE TO BE ADMITTED ONCE YOU ARE IN YOUR ROOM YOU WILL BE ALLOWED TWO (2) VISITORS.  Minor children may have two parents present. Special consideration for safety and communication needs will be reviewed on a case by case basis.  Special instructions:    Oral Hygiene is also important to reduce your risk of infection.  Remember - BRUSH YOUR TEETH THE MORNING OF SURGERY WITH YOUR REGULAR TOOTHPASTE   [facility]- Preparing For Surgery  Before surgery, you can play an important role. Because skin is not sterile, your skin needs to be as free of germs as possible. You can reduce the number of germs on your skin by washing with  CHG (chlorahexidine gluconate) Soap before surgery.  CHG is an antiseptic cleaner which kills germs and bonds with the skin to continue killing germs even after washing.     Please do not use if you have an allergy to CHG or antibacterial soaps. If your skin becomes reddened/irritated stop using the CHG.  Do not shave (including legs and underarms) for at least 48 hours prior to first CHG shower. It is OK to shave your face.  Please follow these instructions carefully.     Shower the NIGHT BEFORE SURGERY and the MORNING OF SURGERY with CHG Soap.   If you chose to wash your hair, wash your hair first as usual with your normal shampoo. After you shampoo, rinse your hair and body thoroughly to remove the shampoo.  Then [employer] and genitals (private parts) with your normal soap and rinse thoroughly to remove soap.  After that Use CHG Soap as you would any other liquid soap. You can apply CHG directly to the skin and wash gently with a scrungie or a clean washcloth.   Apply the CHG Soap to your body ONLY FROM THE NECK DOWN.  Do not use on open wounds or open sores. Avoid contact with your eyes, ears, mouth and genitals (private parts). Wash Face and genitals (private parts)  with your normal soap.   Wash thoroughly, paying  special attention to the area where your surgery will be performed.  Thoroughly rinse your body with warm water from the neck down.  DO NOT shower/wash with your normal soap after using and rinsing off the CHG Soap.  Pat yourself dry with a CLEAN TOWEL.  Wear CLEAN PAJAMAS to bed the night before surgery  Place CLEAN SHEETS on your bed the night before your surgery  DO NOT SLEEP WITH PETS.   Day of Surgery:  Take a shower with CHG soap. Wear Clean/Comfortable clothing the morning of surgery Do not apply any deodorants/lotions.   Remember to brush your teeth WITH YOUR REGULAR TOOTHPASTE.   Please read over the following fact sheets that you were given.

## 2021-01-30 ENCOUNTER — Encounter (HOSPITAL_COMMUNITY)
Admission: RE | Admit: 2021-01-30 | Discharge: 2021-01-30 | Disposition: A | Discharge status: Home / Self Care | Payer: Medicare Other | Source: Ambulatory Visit | Attending: Cardiovascular Disease | Admitting: Cardiovascular Disease

## 2021-01-30 ENCOUNTER — Encounter: Payer: Self-pay | Admitting: Surgery

## 2021-01-30 ENCOUNTER — Institutional Professional Consult (permissible substitution): Payer: Medicare Other | Admitting: Surgery

## 2021-01-30 ENCOUNTER — Other Ambulatory Visit: Payer: Self-pay

## 2021-01-30 ENCOUNTER — Other Ambulatory Visit (HOSPITAL_COMMUNITY): Payer: Medicare Other

## 2021-01-30 ENCOUNTER — Encounter (HOSPITAL_COMMUNITY): Payer: Self-pay

## 2021-01-30 ENCOUNTER — Inpatient Hospital Stay (HOSPITAL_COMMUNITY): Admission: RE | Admit: 2021-01-30 | Payer: Medicare Other | Source: Ambulatory Visit

## 2021-01-30 VITALS — BP 132/83 | HR 89 | Resp 20 | Ht 74.0 in

## 2021-01-30 DIAGNOSIS — I350 Nonrheumatic aortic (valve) stenosis: Secondary | ICD-10-CM | POA: Diagnosis not present

## 2021-01-30 DIAGNOSIS — Z01818 Encounter for other preprocedural examination: Secondary | ICD-10-CM | POA: Insufficient documentation

## 2021-01-30 DIAGNOSIS — Z20822 Contact with and (suspected) exposure to covid-19: Secondary | ICD-10-CM | POA: Insufficient documentation

## 2021-01-30 DIAGNOSIS — I4510 Unspecified right bundle-branch block: Secondary | ICD-10-CM | POA: Insufficient documentation

## 2021-01-30 HISTORY — DX: Depression, unspecified: F32.A

## 2021-01-30 HISTORY — DX: Sleep apnea, unspecified: G47.30

## 2021-01-30 HISTORY — DX: Cardiac murmur, unspecified: R01.1

## 2021-01-30 HISTORY — DX: Cardiac arrhythmia, unspecified: I49.9

## 2021-01-30 LAB — BLOOD GAS, ARTERIAL
Acid-Base Excess: 0.7 mmol/L (ref 0.0–2.0)
Bicarbonate: 24.6 mmol/L (ref 20.0–28.0)
Drawn by: 58793
FIO2: 21
O2 Saturation: 98 %
Patient temperature: 37
pCO2 arterial: 37.7 mmHg (ref 32.0–48.0)
pH, Arterial: 7.43 (ref 7.350–7.450)
pO2, Arterial: 97.8 mmHg (ref 83.0–108.0)

## 2021-01-30 LAB — SURGICAL PCR SCREEN
MRSA, PCR: NEGATIVE
Staphylococcus aureus: NEGATIVE

## 2021-01-30 LAB — CBC
HCT: 43.5 % (ref 39.0–52.0)
Hemoglobin: 15.5 g/dL (ref 13.0–17.0)
MCH: 34.8 pg — ABNORMAL HIGH (ref 26.0–34.0)
MCHC: 35.6 g/dL (ref 30.0–36.0)
MCV: 97.8 fL (ref 80.0–100.0)
Platelets: 232 10*3/uL (ref 150–400)
RBC: 4.45 MIL/uL (ref 4.22–5.81)
RDW: 12.1 % (ref 11.5–15.5)
WBC: 7.6 10*3/uL (ref 4.0–10.5)
nRBC: 0 % (ref 0.0–0.2)

## 2021-01-30 LAB — TYPE AND SCREEN
ABO/RH(D): O POS
Antibody Screen: NEGATIVE

## 2021-01-30 LAB — URINALYSIS, ROUTINE W REFLEX MICROSCOPIC
Bacteria, UA: NONE SEEN
Bilirubin Urine: NEGATIVE
Glucose, UA: NEGATIVE mg/dL
Hgb urine dipstick: NEGATIVE
Ketones, ur: NEGATIVE mg/dL
Leukocytes,Ua: NEGATIVE
Nitrite: NEGATIVE
Protein, ur: NEGATIVE mg/dL
Specific Gravity, Urine: 1.025 (ref 1.005–1.030)
pH: 6 (ref 5.0–8.0)

## 2021-01-30 LAB — COMPREHENSIVE METABOLIC PANEL
ALT: 26 U/L (ref 0–44)
AST: 26 U/L (ref 15–41)
Albumin: 4 g/dL (ref 3.5–5.0)
Alkaline Phosphatase: 70 U/L (ref 38–126)
Anion gap: 10 (ref 5–15)
BUN: 11 mg/dL (ref 8–23)
CO2: 23 mmol/L (ref 22–32)
Calcium: 9.2 mg/dL (ref 8.9–10.3)
Chloride: 103 mmol/L (ref 98–111)
Creatinine, Ser: 0.83 mg/dL (ref 0.61–1.24)
GFR, Estimated: >60 mL/min (ref >60)
Glucose, Bld: 106 mg/dL — ABNORMAL HIGH (ref 70–99)
Potassium: 4 mmol/L (ref 3.5–5.1)
Sodium: 136 mmol/L (ref 135–145)
Total Bilirubin: 0.5 mg/dL (ref 0.3–1.2)
Total Protein: 6.8 g/dL (ref 6.5–8.1)

## 2021-01-30 LAB — PROTIME-INR
INR: 0.9 (ref 0.8–1.2)
Prothrombin Time: 12.3 seconds (ref 11.4–15.2)

## 2021-01-30 LAB — SARS CORONAVIRUS 2 (TAT 6-24 HRS): SARS Coronavirus 2: NEGATIVE

## 2021-01-30 NOTE — Progress Notes (Signed)
PCP - Dr. Billey Gosling Cardiologist - Dr. Rozann Lesches  PPM/ICD - Denies  Chest x-ray - 01/30/21 EKG - 01/30/21 Stress Test - 02/03/12 ECHO - 12/15/20 Cardiac Cath - 01/12/21  Sleep Study - Yes, mild OSA CPAP - No  Patient denies having diabetes.  Blood Thinner Instructions: N/A Aspirin Instructions: Continue until day before surgery. Do not take ASA day of surgery.  ERAS Protcol - No  COVID TEST- 01/30/21 Done in PAT   Anesthesia review: Yes, cardiac hx  Patient denies shortness of breath, fever, cough and chest pain at PAT appointment   All instructions explained to the patient, with a verbal understanding of the material. Patient agrees to go over the instructions while at home for a better understanding. Patient also instructed to self quarantine after being tested for COVID-19. The opportunity to ask questions was provided.

## 2021-01-30 NOTE — Progress Notes (Signed)
Patient ID: Danny Johnson, male   DOB: 11/02/1950, 70 y.o.   MRN: [id number]  [facility]         [address]       ,[place] [address]             [phone]          CARDIOTHORACIC SURGERY CONSULTATION REPORT  PCP is Quay Burow, Claudina Lick, MD Referring Provider is Lauree Chandler, MD Primary Cardiologist is Rozann Lesches, MD  Reason for consultation: Severe aortic  HPI:   The patient is a 70 year old gentleman with a history of hypertension, hyperlipidemia, sleep apnea, arthritis, and a functionally bicuspid aortic valve with aortic stenosis that has been followed by Dr. Domenic Polite for years.  He had a 2D echocardiogram in January 2022 showing a mean gradient of 32 mmHg with a peak gradient of 52 mmHg and an aortic valve area of 0.81 cm by VTI.  There was moderate calcification and thickening of the aortic valve with mild aortic insufficiency.  Left ventricular ejection fraction was 55 to 60%.  He now presents with progressive fatigue with exertion.  He said that he usually plays golf 3 days/week but recently has only been able to play less than 9 holes and frequently has to skip holes.  He has had some shortness of breath with walking and going up stairs.  He denies any dizziness or syncope.  He has had some substernal chest pressure with exertion.  He denies peripheral edema.  His most recent echo on 12/15/2020 showed a mean gradient of 31 mmHg across aortic valve with an aortic valve area of 0.59 by VTI and a dimensionless index of 0.19.  Stroke-volume index was low at 20.  The aortic valve is trileaflet with severe thickening and calcification.  Left ventricular ejection fraction is 55 to 60%.  Cardiac catheterization on 01/12/2021 showed mild nonobstructive coronary disease.  The mean gradient at cath was 33.7 with a peak to peak gradient of 39 mmHg.  He has retired and lives in [place].  He is divorced and  here with his girlfriend today.  Past Medical History:  Diagnosis Date   Anxiety    Aortic stenosis    Arthritis    Asthma    Bicuspid aortic valve    Coronary atherosclerosis of native coronary artery    Minimal, LVEF 60%   Depression    Dysrhythmia    Essential hypertension, benign    Heart murmur    History of hiatal hernia 12/2020   Melanoma ([place]) 1993   Mixed hyperlipidemia    PVC's (premature ventricular contractions)    Sleep apnea     Past Surgical History:  Procedure Laterality Date   HERNIA REPAIR  12/2017   RIGHT/LEFT HEART CATH AND CORONARY ANGIOGRAPHY N/A 01/12/2021   Procedure: RIGHT/LEFT HEART CATH AND CORONARY ANGIOGRAPHY;  Surgeon: Burnell Blanks, MD;  Location: [facility];  Service: Cardiovascular;  Laterality: N/A;   TONSILLECTOMY      Family History  Problem Relation Age of Onset   Dementia Mother    Heart Problems Mother    Heart Problems Father     Social History   Socioeconomic History   Marital status: Divorced    Spouse name: Not on file   Number of children: 3   Years of education: Not on file   Highest education level: Not on file  Occupational History   Occupation: Retired-Safety  and Environmental Compliance  Tobacco Use   Smoking status: Some Days    Packs/day: 0.50    Years: 10.00    Pack years: 5.00    Types: Cigarettes    Start date: 07/21/2001   Smokeless tobacco: Never   Tobacco comments:    quit 2019  Vaping Use   Vaping Use: Never used  Substance and Sexual Activity   Alcohol use: Yes    Alcohol/week: 46.0 standard drinks    Types: 4 Shots of liquor, 42 Standard drinks or equivalent per week    Comment: 4-6 drinks daily, beer/wine/scotch   Drug use: No   Sexual activity: Not on file  Other Topics Concern   Not on file  Social History Narrative   Not on file   Social Determinants of Health   Financial Resource Strain: Not on file  Food Insecurity: Not on file  Transportation Needs: Not on file   Physical Activity: Not on file  Stress: Not on file  Social Connections: Not on file  Intimate Partner Violence: Not on file    Prior to Admission medications   Medication Sig Start Date End Date Taking? Authorizing Provider  albuterol (VENTOLIN HFA) 108 (90 Base) MCG/ACT inhaler INHALE 2 PUFFS BY MOUTH INTO THE LUNGS EVERY 6 HOURS AS NEEDED FOR WHEEZING OR SHORTNESS OF BREATH 08/19/20  Yes Burns, Claudina Lick, MD  aspirin EC 81 MG tablet Take 1 tablet (81 mg total) by mouth every other day. 11/06/18  Yes Satira Sark, MD  atorvastatin (LIPITOR) 10 MG tablet Take 1 tablet (10 mg total) by mouth daily. Patient taking differently: Take 10 mg by mouth in the morning. 11/22/19  Yes Burns, Claudina Lick, MD  Multiple Vitamin (MULTIVITAMIN WITH MINERALS) TABS tablet Take 1 tablet by mouth daily.   Yes [provider]  Omega-3 Fatty Acids (FISH OIL PO) Take 1 capsule by mouth in the morning and at bedtime.   Yes [provider]  quinapril (ACCUPRIL) 10 MG tablet Take 1 tablet (10 mg total) by mouth daily. 11/05/20  Yes Strader, [place] M, PA-C  sildenafil (REVATIO) 20 MG tablet TAKE 3-5 TABLETS BY MOUTH DAILY AS NEEDED Patient taking differently: Take 60-100 mg by mouth daily as needed. 12/02/20  Yes Burns, Claudina Lick, MD  venlafaxine XR (EFFEXOR-XR) 37.5 MG 24 hr capsule TAKE ONE CAPSULE BY MOUTH ONCE DAILY WITH BREAKFAST 12/29/20  Yes Burns, Claudina Lick, MD    Current Outpatient Medications  Medication Sig Dispense Refill   albuterol (VENTOLIN HFA) 108 (90 Base) MCG/ACT inhaler INHALE 2 PUFFS BY MOUTH INTO THE LUNGS EVERY 6 HOURS AS NEEDED FOR WHEEZING OR SHORTNESS OF BREATH 54 g 2   aspirin EC 81 MG tablet Take 1 tablet (81 mg total) by mouth every other day. 90 tablet 3   atorvastatin (LIPITOR) 10 MG tablet Take 1 tablet (10 mg total) by mouth daily. (Patient taking differently: Take 10 mg by mouth in the morning.) 90 tablet 3   Multiple Vitamin (MULTIVITAMIN WITH MINERALS) TABS tablet Take 1  tablet by mouth daily.     Omega-3 Fatty Acids (FISH OIL PO) Take 1 capsule by mouth in the morning and at bedtime.     quinapril (ACCUPRIL) 10 MG tablet Take 1 tablet (10 mg total) by mouth daily. 90 tablet 3   sildenafil (REVATIO) 20 MG tablet TAKE 3-5 TABLETS BY MOUTH DAILY AS NEEDED (Patient taking differently: Take 60-100 mg by mouth daily as needed.) 60 tablet 2   venlafaxine  XR (EFFEXOR-XR) 37.5 MG 24 hr capsule TAKE ONE CAPSULE BY MOUTH ONCE DAILY WITH BREAKFAST 90 capsule 0   No current facility-administered medications for this visit.    Allergies  Allergen Reactions   Dust Mite Extract Shortness Of Breath and Other (See Comments)   Other Shortness Of Breath    pet dander - wheezing      Review of Systems:   General:  normal appetite, + decreased energy, no weight gain, + weight loss, no fever  Cardiac:  + chest pain with exertion, no chest pain at rest, +SOB with moderate exertion, no resting SOB, no PND, no orthopnea, no palpitations, no arrhythmia, no atrial fibrillation, no LE edema, no dizzy spells, no syncope  Respiratory:  + exertional shortness of breath, no home oxygen, no productive cough, no dry cough, no bronchitis, no wheezing, no hemoptysis, + asthma, no pain with inspiration or cough, no sleep apnea, no CPAP at night  GI:   no difficulty swallowing, no reflux, no frequent heartburn, no hiatal hernia, no abdominal pain, no constipation, no diarrhea, no hematochezia, no hematemesis, no melena  GU:   no dysuria,  no frequency, no urinary tract infection, no hematuria, + enlarged prostate, no kidney stones, no kidney disease  Vascular:  no pain suggestive of claudication, no pain in feet, + leg cramps, no varicose veins, no DVT, no non-healing foot ulcer  Neuro:   no stroke, no TIA's, no seizures, no headaches, no temporary blindness one eye,  no slurred speech, no peripheral neuropathy, no chronic pain, no instability of gait, no memory/cognitive  dysfunction  Musculoskeletal: no arthritis,  no joint swelling, no myalgias, no difficulty walking, normal mobility   Skin:   no rash, no itching, no skin infections, no pressure sores or ulcerations  Psych:   n anxiety, ono depression, no nervousness, + unusual recent stress  Eyes:   no blurry vision, no floaters, no recent vision changes, + wears glasses   ENT:   no hearing loss, no loose or painful teeth, no dentures, last saw dentist 12/2020  Hematologic:  no easy bruising, no abnormal bleeding, no clotting disorder, no frequent epistaxis  Endocrine:  no diabetes, does not check CBG's at home     Physical Exam:   BP 132/83 (BP Location: Right Arm, Patient Position: Sitting)   Pulse 89   Resp 20   Ht '6\' 2"'$  (1.88 m)   SpO2 93% Comment: RA  BMI 29.84 kg/m   General:  well-appearing  HEENT:  Unremarkable, NCAT, PERLA, EOMI  Neck:   no JVD, no bruits, no adenopathy   Chest:   clear to auscultation, symmetrical breath sounds, no wheezes, no rhonchi   CV:   RRR, 3/6 systolic murmur RSB, no diastolic murmur  Abdomen:  soft, non-tender, no masses   Extremities:  warm, well-perfused, pulses palpable at ankles, no lower extremity edema  Rectal/GU  Deferred  Neuro:   Grossly non-focal and symmetrical throughout  Skin:   Clean and dry, no rashes, no breakdown  Diagnostic Tests:  Lab Results: Recent Labs    01/30/21 1604  WBC 7.6  HGB 15.5  HCT 43.5  PLT 232   BMET:  Recent Labs    01/30/21 1604  NA 136  K 4.0  CL 103  CO2 23  GLUCOSE 106*  BUN 11  CREATININE 0.83  CALCIUM 9.2    CBG (last 3)  No results for input(s): GLUCAP in the last 72 hours. PT/INR:   Recent Labs  01/30/21 1604  LABPROT 12.3  INR 0.9   ECHOCARDIOGRAM REPORT         Patient Name:   SR. Jadarious Lind Guest Date of Exam: 12/15/2020  Medical Rec #:  [id number]         Height:       73.5 in  Accession #:    [id number]        Weight:       233.0 lb  Date of Birth:  1951-05-02         BSA:           2.308 m  Patient Age:    40 years          BP:           126/70 mmHg  Patient Gender: M                 HR:           75 bpm.  Exam Location:  [facility]   Procedure: 2D Echo, Cardiac Doppler and Color Doppler   Indications:    I35.0 (ICD-10-CM) - Nonrheumatic aortic valve stenosis     History:        Patient has prior history of Echocardiogram examinations,  most                  recent 06/16/2020. Arrythmias:PVC; Risk  Factors:Hypertension,                  Dyslipidemia, Former Smoker and Prediabetes. Aortic  stenosis                  with bicuspid valve.     Sonographer:    Alvino Chapel RCS  Referring Phys: [place]     1. Left ventricular ejection fraction, by estimation, is 55 to 60%. The  left ventricle has normal function. The left ventricle has no regional  wall motion abnormalities. There is moderate left ventricular hypertrophy.  Left ventricular diastolic  parameters are consistent with Grade I diastolic dysfunction (impaired  relaxation).   2. Right ventricular systolic function is normal. The right ventricular  size is normal.   3. The mitral valve is normal in structure. No evidence of mitral valve  regurgitation. No evidence of mitral stenosis.   4. Mean gradient 31 mmHg, AVA VTI 0.59, DI 0.19, SVI 20. Criteria would  be consistent with paradoxical low flow low gradient severe aortic  stenosis, lower than expected gradient given decreased stroke volume  index. . The aortic valve is tricuspid. There   is severe calcifcation of the aortic valve. There is severe thickening of  the aortic valve. Aortic valve regurgitation is mild. Severe aortic valve  stenosis.   5. The inferior vena cava is normal in size with greater than 50%  respiratory variability, suggesting right atrial pressure of 3 mmHg.   FINDINGS   Left Ventricle: Left ventricular ejection fraction, by estimation, is 55  to 60%. The left ventricle has normal function. The  left ventricle has no  regional wall motion abnormalities. The left ventricular internal cavity  size was normal in size. There is   moderate left ventricular hypertrophy. Left ventricular diastolic  parameters are consistent with Grade I diastolic dysfunction (impaired  relaxation).   Right Ventricle: The right ventricular size is normal. No increase in  right ventricular wall thickness. Right ventricular systolic function is  normal.   Left Atrium: Left atrial size was  normal in size.   Right Atrium: Right atrial size was normal in size.   Pericardium: There is no evidence of pericardial effusion.   Mitral Valve: The mitral valve is normal in structure. No evidence of  mitral valve regurgitation. No evidence of mitral valve stenosis.   Tricuspid Valve: The tricuspid valve is normal in structure. Tricuspid  valve regurgitation is not demonstrated. No evidence of tricuspid  stenosis.   Aortic Valve: Mean gradient 31 mmHg, AVA VTI 0.59, DI 0.19, SVI 20.  Criteria would be consistent with paradoxical low flow low gradient severe  aortic stenosis, lower than expected gradient given decreased stroke  volume index. The aortic valve is  tricuspid. There is severe calcifcation of the aortic valve. There is  severe thickening of the aortic valve. There is severe aortic valve  annular calcification. Aortic valve regurgitation is mild. Aortic  regurgitation PHT measures 503 msec. Severe aortic   stenosis is present. Aortic valve mean gradient measures 31.0 mmHg.  Aortic valve peak gradient measures 49.0 mmHg. Aortic valve area, by VTI  measures 0.59 cm.   Pulmonic Valve: The pulmonic valve was not well visualized. Pulmonic valve  regurgitation is not visualized. No evidence of pulmonic stenosis.   Aorta: The aortic root is normal in size and structure.   Venous: The inferior vena cava is normal in size with greater than 50%  respiratory variability, suggesting right atrial pressure  of 3 mmHg.   IAS/Shunts: No atrial level shunt detected by color flow Doppler.      LEFT VENTRICLE  PLAX 2D  LVIDd:         5.00 cm  Diastology  LVIDs:         2.70 cm  LV e' medial:    5.87 cm/s  LV PW:         1.40 cm  LV E/e' medial:  8.5  LV IVS:        1.30 cm  LV e' lateral:   7.94 cm/s  LVOT diam:     2.00 cm  LV E/e' lateral: 6.3  LV SV:         45  LV SV Index:   20  LVOT Area:     3.14 cm      RIGHT VENTRICLE  RV S prime:     11.50 cm/s  TAPSE (M-mode): 2.1 cm   LEFT ATRIUM             Index       RIGHT ATRIUM           Index  LA diam:        4.20 cm 1.82 cm/m  RA Area:     19.40 cm  LA Vol (A2C):   65.0 ml 28.17 ml/m RA Volume:   51.90 ml  22.49 ml/m  LA Vol (A4C):   63.3 ml 27.43 ml/m  LA Biplane Vol: 63.9 ml 27.69 ml/m   AORTIC VALVE  AV Area (Vmax):    0.59 cm  AV Area (Vmean):   0.52 cm  AV Area (VTI):     0.59 cm  AV Vmax:           350.00 cm/s  AV Vmean:          246.250 cm/s  AV VTI:            0.764 m  AV Peak Grad:      49.0 mmHg  AV Mean Grad:      31.0  mmHg  LVOT Vmax:         65.30 cm/s  LVOT Vmean:        40.500 cm/s  LVOT VTI:          0.144 m  LVOT/AV VTI ratio: 0.19  AI PHT:            503 msec     AORTA  Ao Root diam: 3.50 cm   MITRAL VALVE  MV Area (PHT): 2.95 cm    SHUNTS  MV Decel Time: 257 msec    Systemic VTI:  0.14 m  MV E velocity: 49.90 cm/s  Systemic Diam: 2.00 cm  MV A velocity: 65.80 cm/s  MV E/A ratio:  0.76   Carlyle Dolly MD  Electronically signed by Carlyle Dolly MD  Signature Date/Time: 12/15/2020/10:44:14 AM         Final     Physicians  Panel Physicians Referring Physician Case Authorizing Physician  Burnell Blanks, MD (Primary)     Procedures  RIGHT/LEFT HEART CATH AND CORONARY ANGIOGRAPHY   Conclusion      Prox RCA lesion is 20% stenosed.   Mid Cx lesion is 20% stenosed.   Prox LAD to Mid LAD lesion is 20% stenosed.   Mild non-obstructive CAD Severe aortic stenosis by  echo (paradoxical low flow/low gradient severe AS). Cath mean gradient 33.7 mmHg, peak to peak gradient 39 mmHg.    Recommendations: Continue workup for TAVR   Indications  Severe aortic stenosis [I35.0 (ICD-10-CM)]   Procedural Details  Technical Details Indication: Severe aortic stenosis, workup for TAVR  Procedure: The risks, benefits, complications, treatment options, and expected outcomes were discussed with the patient. The patient and/or family concurred with the proposed plan, giving informed consent. The patient was brought to the cath lab after IV hydration was given. The patient was further sedated with Versed and Fentanyl. The right antecubital area was prepped and draped and u/s guided access into the right antecubital vein. A 5 French sheath was placed into the right antecubital vein. Right heart cath performed with a balloon tipped catheter. The right wrist was prepped and draped in a sterile fashion. 1% lidocaine was used for local anesthesia. Using the modified Seldinger access technique, a 5 French sheath was placed in the right radial artery. 3 mg Verapamil was given through the sheath.  5000 units IV heparin was given. Standard diagnostic catheters were used to perform selective coronary angiography. The aortic valve was crossed with an AL-1 and the J wire. The sheath was removed from the right radial artery and a Terumo hemostasis band was applied at the arteriotomy site on the right wrist.      Estimated blood loss <50 mL.   During this procedure medications were administered to achieve and maintain moderate conscious sedation while the patient's heart rate, blood pressure, and oxygen saturation were continuously monitored and I was present face-to-face 100% of this time.   Medications (Filter: Administrations occurring from [phone] to 0931 on 01/12/21)  important  Continuous medications are totaled by the amount administered until 01/12/21 0931.   Heparin (Porcine) in  NaCl 1000-0.9 UT/500ML-% SOLN (mL) Total volume:  1,000 mL Date/Time Rate/Dose/Volume Action   01/12/21 0841 500 mL Given   0841 500 mL Given    fentaNYL (SUBLIMAZE) injection (mcg) Total dose:  50 mcg Date/Time Rate/Dose/Volume Action   01/12/21 0851 50 mcg Given    midazolam (VERSED) injection (mg) Total dose:  2 mg Date/Time Rate/Dose/Volume Action   01/12/21  0851 2 mg Given    lidocaine (PF) (XYLOCAINE) 1 % injection (mL) Total volume:  4 mL Date/Time Rate/Dose/Volume Action   01/12/21 0859 2 mL Given   0900 2 mL Given    heparin sodium (porcine) injection (Units) Total dose:  5,000 Units Date/Time Rate/Dose/Volume Action   01/12/21 0911 5,000 Units Given    Radial Cocktail/Verapamil only (mL) Total volume:  10 mL Date/Time Rate/Dose/Volume Action   01/12/21 0907 10 mL Given    iohexol (OMNIPAQUE) 350 MG/ML injection (mL) Total volume:  80 mL Date/Time Rate/Dose/Volume Action   01/12/21 0930 80 mL Given    Sedation Time  Sedation Time Physician-1: 34 minutes 15 seconds Radiation/Fluoro  Fluoro time: 4.9 (min) DAP: 14890 (mGycm2) Cumulative Air Kerma: 234 (mGy) Coronary Findings  Diagnostic Dominance: Right Left Anterior Descending  Vessel is large.  Prox LAD to Mid LAD lesion is 20% stenosed.  Left Circumflex  Vessel is large.  Mid Cx lesion is 20% stenosed.  Right Coronary Artery  Vessel is large.  Prox RCA lesion is 20% stenosed.  Intervention  No interventions have been documented. Coronary Diagrams  Diagnostic Dominance: Right Intervention  Implants     No implant documentation for this case.   Syngo Images   Show images for CARDIAC CATHETERIZATION Images on Long Term Storage   Show images for Keane, Yarnell to Procedure Log  Procedure Log    Hemo Data  Flowsheet Row Most Recent Value  Fick Cardiac Output 6.81 L/min  Fick Cardiac Output Index 2.98 (L/min)/BSA  Aortic Mean Gradient 33.67 mmHg  Aortic Peak Gradient 39  mmHg  Aortic Valve Area 1.16  Aortic Value Area Index 0.51 cm2/BSA  RA A Wave 3 mmHg  RA V Wave 2 mmHg  RA Mean 2 mmHg  RV Systolic Pressure 29 mmHg  RV Diastolic Pressure 0 mmHg  RV EDP 2 mmHg  PA Systolic Pressure 19 mmHg  PA Diastolic Pressure 8 mmHg  PA Mean 13 mmHg  PW A Wave 9 mmHg  PW V Wave 9 mmHg  PW Mean 7 mmHg  AO Systolic Pressure 123456 mmHg  AO Diastolic Pressure 68 mmHg  AO Mean 85 mmHg  LV Systolic Pressure 123456 mmHg  LV Diastolic Pressure 4 mmHg  LV EDP 9 mmHg  AOp Systolic Pressure [id number] mmHg  AOp Diastolic Pressure 66 mmHg  AOp Mean Pressure 88 mmHg  LVp Systolic Pressure 123456 mmHg  LVp Diastolic Pressure 5 mmHg  LVp EDP Pressure 9 mmHg  QP/QS 1  TPVR Index 4.36 HRUI  TSVR Index 29.51 HRUI  PVR SVR Ratio 0.07  TPVR/TSVR Ratio 0.15    ADDENDUM REPORT: 01/17/2021 10:46   CLINICAL DATA:  Pre-op transcatheter aortic valve replacement (TAVR)   EXAM: Cardiac TAVR CT   TECHNIQUE: The patient was scanned on a Siemens Force [id number] slice scanner. A 120 kV retrospective scan was triggered in the descending thoracic aorta at 111 HU's. Gantry rotation speed was 270 msecs and collimation was .9 mm. The 3D data set was reconstructed in 5% intervals of the R-R cycle. Systolic and diastolic phases were analyzed on a dedicated work station using MPR, MIP and VRT modes. The patient received 193m OMNIPAQUE IOHEXOL 350 MG/ML SOLN of contrast.   FINDINGS: Aortic Valve: Tricuspid aortic valve. Severely reduced cusp separation. Severely thickened, severely calcified aortic valve cusps.   AV calcium score: 3230   Virtual Basal Annulus Measurements:   Maximum/Minimum Diameter: 30.3 x 27.3 mm   Perimeter: 89.3 mm  Area:  622 mm2   Prominent LVOT calcifications below RCC, protruding into annulus, and NCC.   Based on these measurements, the annulus would be suitable for a 29 mm Sapien 3 valve.   Sinus of Valsalva Measurements:   Non-coronary:  30 mm   Right -  coronary:  28 mm   Left - coronary:  31 mm   Sinus of Valsalva Height:   Left: 18.2 mm   Right: 23.5 mm   Aorta: Conventional 3 vessel branch pattern of aortic arch. Minimal aortic atherosclerosis.   Sinotubular Junction: 29 mm   Ascending Thoracic Aorta:  32 mm   Aortic Arch:  27 mm   Descending Thoracic Aorta:  27 mm   Coronary Artery Height above Annulus:   Left Main: 11.6 mm   Right Coronary: 15.9 mm   Coronary Arteries: Normal coronary artery origins, 3 vessel coronary artery calcifications.   Optimum Fluoroscopic Angle for Delivery: LAO 9, CAU 10   No LA appendage thrombus.   IMPRESSION: 1. Tricuspid aortic valve. Severely reduced cusp separation. Severely thickened, severely calcified aortic valve cusps.   2. AV calcium score: 3230   3. Annulus Area: 622 mm2. The annulus would be suitable for a 29 mm Sapien 3 valve.   4. Prominent LVOT calcifications below RCC, protruding into annulus, and NCC.   5. Sufficient coronary artery heights from annulus.   6. Optimum Fluoroscopic Angle for Delivery: LAO 9, CAU 10     Electronically Signed   By: Cherlynn Kaiser M.D.   On: 01/17/2021 10:46    Addended by Elouise Munroe, MD on 01/17/2021 10:49 AM   Study Result  Narrative & Impression  EXAM: OVER-READ INTERPRETATION  CT CHEST   The following report is an over-read performed by radiologist Dr. Vinnie Langton of [facility], [place] on 01/15/2021. This over-read does not include interpretation of cardiac or coronary anatomy or pathology. The coronary calcium score/coronary CTA interpretation by the cardiologist is attached.   COMPARISON:  None.   FINDINGS: Extracardiac findings will be described separately under dictation for contemporaneously obtained CTA chest, abdomen and pelvis.   IMPRESSION: Please see separate dictation for contemporaneously obtained CTA chest, abdomen and pelvis dated 01/15/2021 for full description of relevant  extracardiac findings.   Electronically Signed: By: Vinnie Langton M.D. On: 01/15/2021 10:52      Narrative & Impression  CLINICAL DATA:  70 year old male with history of severe aortic stenosis. Preprocedural study prior to potential transcatheter aortic valve replacement (TAVR) procedure.   EXAM: CT ANGIOGRAPHY CHEST, ABDOMEN AND PELVIS   TECHNIQUE: Multidetector CT imaging through the chest, abdomen and pelvis was performed using the standard protocol during bolus administration of intravenous contrast. Multiplanar reconstructed images and MIPs were obtained and reviewed to evaluate the vascular anatomy.   CONTRAST:  186m OMNIPAQUE IOHEXOL 350 MG/ML SOLN   COMPARISON:  None.   FINDINGS: CTA CHEST FINDINGS   Cardiovascular: Heart size is normal. There is no significant pericardial fluid, thickening or pericardial calcification. There is aortic atherosclerosis, as well as atherosclerosis of the great vessels of the mediastinum and the coronary arteries, including calcified atherosclerotic plaque in the left anterior descending, left circumflex and right coronary arteries. Severe thickening and calcification of the aortic valve.   Mediastinum/Lymph Nodes: No pathologically enlarged mediastinal or hilar lymph nodes. Small hiatal hernia. No axillary lymphadenopathy.   Lungs/Pleura: No suspicious appearing pulmonary nodules or masses are noted. No acute consolidative airspace disease. No pleural effusions.   Musculoskeletal/Soft Tissues:  There are no aggressive appearing lytic or blastic lesions noted in the visualized portions of the skeleton.   CTA ABDOMEN AND PELVIS FINDINGS   Hepatobiliary: Subcentimeter low-attenuation lesion in segment 4A of the liver, too small to characterize, but statistically likely to represent a tiny cyst. No other suspicious hepatic lesions. No intra or extrahepatic biliary ductal dilatation. Gallbladder is normal in appearance.    Pancreas: No pancreatic mass. No pancreatic ductal dilatation. No pancreatic or peripancreatic fluid collections or inflammatory changes.   Spleen: Unremarkable.   Adrenals/Urinary Tract: Exophytic 2.3 cm low-attenuation lesion in the upper pole the left kidney compatible with a simple cyst. Adjacent to this there is a subcentimeter low-attenuation lesion, too small to characterize, but statistically likely to represent a small cyst. Right kidney and bilateral adrenal glands are normal in appearance. No hydroureteronephrosis. Urinary bladder is unremarkable in appearance.   Stomach/Bowel: The appearance of the stomach is normal. No pathologic dilatation of small bowel or colon. A few scattered colonic diverticulae are noted, without surrounding inflammatory changes to suggest an acute diverticulitis at this time. Normal appendix.   Vascular/Lymphatic: Vascular findings and measurements pertinent to potential TAVR procedure, as detailed below. Aortic atherosclerosis, without evidence of aneurysm or dissection in the abdominal or pelvic vasculature. No lymphadenopathy noted in the abdomen or pelvis.   Reproductive: Prostate gland and seminal vesicles are unremarkable in appearance.   Other: No significant volume of ascites.  No pneumoperitoneum.   Musculoskeletal: Bilateral pars defects at L5. 6 mm of anterolisthesis of L5 upon S1. There are no aggressive appearing lytic or blastic lesions noted in the visualized portions of the skeleton.   VASCULAR MEASUREMENTS PERTINENT TO TAVR:   AORTA:   Minimal Aortic Diameter-17 x 17 mm   Severity of Aortic Calcification-mild   RIGHT PELVIS:   Right Common Iliac Artery -   Minimal Diameter-10.5 x 9.7 mm   Tortuosity-mild-to-moderate   Calcification-none   Right External Iliac Artery -   Minimal Diameter-6.9 x 7.0 mm   Tortuosity-moderate to severe   Calcification-none   Right Common Femoral Artery -   Minimal  Diameter-9.1 x 8.9 mm   Tortuosity-mild   Calcification-none   LEFT PELVIS:   Left Common Iliac Artery -   Minimal Diameter-8.4 x 10.2 mm   Tortuosity-moderate   Calcification-mild   Left External Iliac Artery -   Minimal Diameter-7.2 x 6.6 mm   Tortuosity-mild to moderate   Calcification-none   Left Common Femoral Artery -   Minimal Diameter-8.1 x 8.5 mm   Tortuosity-mild   Calcification-none   Review of the MIP images confirms the above findings.   IMPRESSION: 1. Vascular findings and measurements pertinent to potential TAVR procedure, as detailed above. 2. Severe thickening calcification of the aortic valve, compatible with reported clinical history of severe aortic stenosis. 3. Aortic atherosclerosis, in addition to 3 vessel coronary artery disease. Assessment for potential risk factor modification, dietary therapy or pharmacologic therapy may be warranted, if clinically indicated. 4. Colonic diverticulosis without evidence of acute diverticulitis at this time. 5. Additional incidental findings, as above.     Electronically Signed   By: Vinnie Langton M.D.   On: 01/15/2021 11:34       STS Risk Score: Procedure: AVR + CAB Risk of Mortality: 0.944% Renal Failure: 0.770% Permanent Stroke: 0.972% Prolonged Ventilation: 4.672% DSW Infection: 0.111% Reoperation: 3.446% Morbidity or Mortality: 8.119% Short Length of Stay: 57.286% Long Length of Stay: 2.661%  Impression:  This 70 year old gentleman has stage D3 paradoxical  low-flow, low gradient severe aortic stenosis with New York Heart Association class II symptoms of exertional fatigue and shortness of breath consistent with chronic diastolic congestive heart failure.  I personally reviewed his 2D echo, cardiac catheterization, and CTA studies.  His echocardiogram shows a heavily calcified aortic valve with thickening and restricted leaflet mobility.  Visually it looks like a severely  stenotic valve.  The mean gradient was 31 mm with a valve area of 0.59 cm, dimensionless index of 0.19, and low stroke-volume index of 20.  Cardiac catheterization shows mild nonobstructive coronary disease.  The mean gradient across aortic valve was measured at 34 mmHg with a peak to peak gradient of 39 mmHg.  I agree that aortic valve replacement is indicated in this patient for relief of his progressive symptoms and to prevent progressive left ventricular deterioration.  He has mild nonobstructive coronary disease on catheterization and given his age I think transcatheter aortic valve replacement is a reasonable option for treating him.  His gated cardiac CTA shows anatomy suitable for TAVR using a 29 mm SAPIEN 3 valve.  His abdominal and pelvic CTA shows adequate pelvic vascular anatomy to allow transfemoral insertion.  The patient was counseled at length regarding treatment alternatives for management of severe symptomatic aortic stenosis. The risks and benefits of surgical intervention has been discussed in detail. Long-term prognosis with medical therapy was discussed. Alternative approaches such as conventional surgical aortic valve replacement, transcatheter aortic valve replacement, and palliative medical therapy were compared and contrasted at length. This discussion was placed in the context of the patient's own specific clinical presentation and past medical history. All of his questions have been addressed.   Following the decision to proceed with transcatheter aortic valve replacement, a discussion was held regarding what types of management strategies would be attempted intraoperatively in the event of life-threatening complications, including whether or not the patient would be considered a candidate for the use of cardiopulmonary bypass and/or conversion to open sternotomy for attempted surgical intervention.  I think he is a candidate for emergent sternotomy to manage any intraoperative  complications.  The patient is aware of the fact that transient use of cardiopulmonary bypass may be necessary. The patient has been advised of a variety of complications that might develop including but not limited to risks of death, stroke, paravalvular leak, aortic dissection or other major vascular complications, aortic annulus rupture, device embolization, cardiac rupture or perforation, mitral regurgitation, acute myocardial infarction, arrhythmia, heart block or bradycardia requiring permanent pacemaker placement, congestive heart failure, respiratory failure, renal failure, pneumonia, infection, other late complications related to structural valve deterioration or migration, or other complications that might ultimately cause a temporary or permanent loss of functional independence or other long term morbidity. The patient provides full informed consent for the procedure as described and all questions were answered.      Plan:  He will be scheduled for transfemoral transcatheter aortic valve replacement using a SAPIEN 3 valve on 02/03/2021.  I spent 60 minutes performing this consultation and > 50% of this time was spent face to face counseling and coordinating the care of this patient's severe symptomatic aortic stenosis.   Gaye Pollack, MD 01/30/2021 6:12 PM

## 2021-01-30 NOTE — Pre-Procedure Instructions (Signed)
Surgical Instructions    Your procedure is scheduled on Tuesday, September 13th, 2022.  Report to [facility] Main Entrance "A" at 08:45 A.M., then check in with the Admitting office.  Call this number if you have problems the morning of surgery:  [phone]   If you have any questions prior to your surgery date call [phone]: Open Monday-Friday 8am-4pm    Remember:  Do not eat or drink after midnight the night before your surgery           Take all medications as directed throughout day before surgery. Do not take any medications the morning of surgery.   Do not wear jewelry Do not wear lotions, powders, colognes, or deodorant. Men may shave face and neck. Do not bring valuables to the hospital.             [facility] is not responsible for any belongings or valuables.  Do NOT Smoke (Tobacco/Vaping)  24 hours prior to your procedure If you use a CPAP at night, you may bring your mask for your overnight stay.   Contacts, glasses, dentures or bridgework may not be worn into surgery, please bring cases for these belongings   For patients admitted to the hospital, discharge time will be determined by your treatment team.   Patients discharged the day of surgery will not be allowed to drive home, and someone needs to stay with them for 24 hours.  ONLY 1 SUPPORT PERSON MAY BE PRESENT WHILE YOU ARE IN SURGERY. IF YOU ARE TO BE ADMITTED ONCE YOU ARE IN YOUR ROOM YOU WILL BE ALLOWED TWO (2) VISITORS.  Minor children may have two parents present. Special consideration for safety and communication needs will be reviewed on a case by case basis.  Special instructions:    Oral Hygiene is also important to reduce your risk of infection.  Remember - BRUSH YOUR TEETH THE MORNING OF SURGERY WITH YOUR REGULAR TOOTHPASTE   [facility]- Preparing For Surgery  Before surgery, you can play an important role. Because skin is not sterile, your skin needs to be as free of germs as possible.  You can reduce the number of germs on your skin by washing with CHG (chlorahexidine gluconate) Soap before surgery.  CHG is an antiseptic cleaner which kills germs and bonds with the skin to continue killing germs even after washing.     Please do not use if you have an allergy to CHG or antibacterial soaps. If your skin becomes reddened/irritated stop using the CHG.  Do not shave (including legs and underarms) for at least 48 hours prior to first CHG shower. It is OK to shave your face.  Please follow these instructions carefully.     Shower the NIGHT BEFORE SURGERY and the MORNING OF SURGERY with CHG Soap.   If you chose to wash your hair, wash your hair first as usual with your normal shampoo. After you shampoo, rinse your hair and body thoroughly to remove the shampoo.  Then [employer] and genitals (private parts) with your normal soap and rinse thoroughly to remove soap.  After that Use CHG Soap as you would any other liquid soap. You can apply CHG directly to the skin and wash gently with a scrungie or a clean washcloth.   Apply the CHG Soap to your body ONLY FROM THE NECK DOWN.  Do not use on open wounds or open sores. Avoid contact with your eyes, ears, mouth and genitals (private parts). Wash Face and  genitals (private parts)  with your normal soap.   Wash thoroughly, paying special attention to the area where your surgery will be performed.  Thoroughly rinse your body with warm water from the neck down.  DO NOT shower/wash with your normal soap after using and rinsing off the CHG Soap.  Pat yourself dry with a CLEAN TOWEL.  Wear CLEAN PAJAMAS to bed the night before surgery  Place CLEAN SHEETS on your bed the night before your surgery  DO NOT SLEEP WITH PETS.   Day of Surgery:  Take a shower with CHG soap. Wear Clean/Comfortable clothing the morning of surgery Do not apply any deodorants/lotions.   Remember to brush your teeth WITH YOUR REGULAR TOOTHPASTE.   Please read  over the following fact sheets that you were given.

## 2021-02-02 MED ORDER — CEFAZOLIN SODIUM-DEXTROSE 2-4 GM/100ML-% IV SOLN
2.0000 g | INTRAVENOUS | Status: AC
  Administered 2021-02-03: 2 g via INTRAVENOUS
  Filled 2021-02-02: qty 100

## 2021-02-02 MED ORDER — MAGNESIUM SULFATE 50 % IJ SOLN
40.0000 meq | INTRAMUSCULAR | Status: DC
  Filled 2021-02-02: qty 9.85

## 2021-02-02 MED ORDER — POTASSIUM CHLORIDE 2 MEQ/ML IV SOLN
80.0000 meq | INTRAVENOUS | Status: DC
  Filled 2021-02-02: qty 40

## 2021-02-02 MED ORDER — DEXMEDETOMIDINE HCL IN NACL 400 MCG/100ML IV SOLN
0.1000 ug/kg/h | INTRAVENOUS | Status: AC
  Administered 2021-02-03: 1 ug/kg/h via INTRAVENOUS
  Filled 2021-02-02: qty 100

## 2021-02-02 MED ORDER — NOREPINEPHRINE 4 MG/250ML-% IV SOLN
0.0000 ug/min | INTRAVENOUS | Status: AC
  Administered 2021-02-03: 2 ug/min via INTRAVENOUS
  Filled 2021-02-02: qty 250

## 2021-02-02 MED ORDER — SODIUM CHLORIDE 0.9 % IV SOLN
INTRAVENOUS | Status: DC
  Filled 2021-02-02: qty 30

## 2021-02-02 NOTE — H&P (Signed)
[address]       [place] [address]             [phone]      Cardiothoracic Surgery Admission History and Physical  PCP is Quay Burow, Claudina Lick, MD Referring Provider is Lauree Chandler, MD Primary Cardiologist is Rozann Lesches, MD   Reason for admission: Severe aortic stenosis   HPI:     The patient is a 70 year old gentleman with a history of hypertension, hyperlipidemia, sleep apnea, arthritis, and a functionally bicuspid aortic valve with aortic stenosis that has been followed by Dr. Domenic Polite for years.  He had a 2D echocardiogram in January 2022 showing a mean gradient of 32 mmHg with a peak gradient of 52 mmHg and an aortic valve area of 0.81 cm by VTI.  There was moderate calcification and thickening of the aortic valve with mild aortic insufficiency.  Left ventricular ejection fraction was 55 to 60%.  He now presents with progressive fatigue with exertion.  He said that he usually plays golf 3 days/week but recently has only been able to play less than 9 holes and frequently has to skip holes.  He has had some shortness of breath with walking and going up stairs.  He denies any dizziness or syncope.  He has had some substernal chest pressure with exertion.  He denies peripheral edema.  His most recent echo on 12/15/2020 showed a mean gradient of 31 mmHg across aortic valve with an aortic valve area of 0.59 by VTI and a dimensionless index of 0.19.  Stroke-volume index was low at 20.  The aortic valve is trileaflet with severe thickening and calcification.  Left ventricular ejection fraction is 55 to 60%.  Cardiac catheterization on 01/12/2021 showed mild nonobstructive coronary disease.  The mean gradient at cath was 33.7 with a peak to peak gradient of 39 mmHg.   He has retired and lives in [place].  He is divorced.      Past Medical History:  Diagnosis Date   Anxiety     Aortic stenosis     Arthritis     Asthma     Bicuspid aortic valve      Coronary atherosclerosis of native coronary artery      Minimal, LVEF 60%   Depression     Dysrhythmia     Essential hypertension, benign     Heart murmur     History of hiatal hernia 12/2020   Melanoma ([facility]) 1993   Mixed hyperlipidemia     PVC's (premature ventricular contractions)     Sleep apnea             Past Surgical History:  Procedure Laterality Date   HERNIA REPAIR   12/2017   RIGHT/LEFT HEART CATH AND CORONARY ANGIOGRAPHY N/A 01/12/2021    Procedure: RIGHT/LEFT HEART CATH AND CORONARY ANGIOGRAPHY;  Surgeon: Burnell Blanks, MD;  Location: [facility];  Service: Cardiovascular;  Laterality: N/A;   TONSILLECTOMY               Family History  Problem Relation Age of Onset   Dementia Mother     Heart Problems Mother     Heart Problems Father        Social History         Socioeconomic History   Marital status: Divorced      Spouse name: Not on file   Number of children: 3   Years of education: Not  on file   Highest education level: Not on file  Occupational History   Occupation: Retired-[employer]  Tobacco Use   Smoking status: Some Days      Packs/day: 0.50      Years: 10.00      Pack years: 5.00      Types: Cigarettes      Start date: 07/21/2001   Smokeless tobacco: Never   Tobacco comments:      quit 2019  Vaping Use   Vaping Use: Never used  Substance and Sexual Activity   Alcohol use: Yes      Alcohol/week: 46.0 standard drinks      Types: 4 Shots of liquor, 42 Standard drinks or equivalent per week      Comment: 4-6 drinks daily, beer/wine/scotch   Drug use: No   Sexual activity: Not on file  Other Topics Concern   Not on file  Social History Narrative   Not on file    Social Determinants of Health    Financial Resource Strain: Not on file  Food Insecurity: Not on file  Transportation Needs: Not on file  Physical Activity: Not on file  Stress: Not on file  Social Connections: Not on file   Intimate Partner Violence: Not on file             Prior to Admission medications   Medication Sig Start Date End Date Taking? Authorizing Provider  albuterol (VENTOLIN HFA) 108 (90 Base) MCG/ACT inhaler INHALE 2 PUFFS BY MOUTH INTO THE LUNGS EVERY 6 HOURS AS NEEDED FOR WHEEZING OR SHORTNESS OF BREATH 08/19/20   Yes Burns, Claudina Lick, MD  aspirin EC 81 MG tablet Take 1 tablet (81 mg total) by mouth every other day. 11/06/18   Yes Satira Sark, MD  atorvastatin (LIPITOR) 10 MG tablet Take 1 tablet (10 mg total) by mouth daily. Patient taking differently: Take 10 mg by mouth in the morning. 11/22/19   Yes Burns, Claudina Lick, MD  Multiple Vitamin (MULTIVITAMIN WITH MINERALS) TABS tablet Take 1 tablet by mouth daily.     Yes [provider]  Omega-3 Fatty Acids (FISH OIL PO) Take 1 capsule by mouth in the morning and at bedtime.     Yes [provider]  quinapril (ACCUPRIL) 10 MG tablet Take 1 tablet (10 mg total) by mouth daily. 11/05/20   Yes Strader, [place] M, PA-C  sildenafil (REVATIO) 20 MG tablet TAKE 3-5 TABLETS BY MOUTH DAILY AS NEEDED Patient taking differently: Take 60-100 mg by mouth daily as needed. 12/02/20   Yes Burns, Claudina Lick, MD  venlafaxine XR (EFFEXOR-XR) 37.5 MG 24 hr capsule TAKE ONE CAPSULE BY MOUTH ONCE DAILY WITH BREAKFAST 12/29/20   Yes Burns, Claudina Lick, MD            Current Outpatient Medications  Medication Sig Dispense Refill   albuterol (VENTOLIN HFA) 108 (90 Base) MCG/ACT inhaler INHALE 2 PUFFS BY MOUTH INTO THE LUNGS EVERY 6 HOURS AS NEEDED FOR WHEEZING OR SHORTNESS OF BREATH 54 g 2   aspirin EC 81 MG tablet Take 1 tablet (81 mg total) by mouth every other day. 90 tablet 3   atorvastatin (LIPITOR) 10 MG tablet Take 1 tablet (10 mg total) by mouth daily. (Patient taking differently: Take 10 mg by mouth in the morning.) 90 tablet 3   Multiple Vitamin (MULTIVITAMIN WITH MINERALS) TABS tablet Take 1 tablet by mouth daily.       Omega-3 Fatty Acids (FISH OIL  PO) Take 1 capsule by mouth in the morning and at bedtime.       quinapril (ACCUPRIL) 10 MG tablet Take 1 tablet (10 mg total) by mouth daily. 90 tablet 3   sildenafil (REVATIO) 20 MG tablet TAKE 3-5 TABLETS BY MOUTH DAILY AS NEEDED (Patient taking differently: Take 60-100 mg by mouth daily as needed.) 60 tablet 2   venlafaxine XR (EFFEXOR-XR) 37.5 MG 24 hr capsule TAKE ONE CAPSULE BY MOUTH ONCE DAILY WITH BREAKFAST 90 capsule 0    No current facility-administered medications for this visit.           Allergies  Allergen Reactions   Dust Mite Extract Shortness Of Breath and Other (See Comments)   Other Shortness Of Breath      pet dander - wheezing          Review of Systems:               General:                      normal appetite, + decreased energy, no weight gain, + weight loss, no fever             Cardiac:                       + chest pain with exertion, no chest pain at rest, +SOB with moderate exertion, no resting SOB, no PND, no orthopnea, no palpitations, no arrhythmia, no atrial fibrillation, no LE edema, no dizzy spells, no syncope             Respiratory:                 + exertional shortness of breath, no home oxygen, no productive cough, no dry cough, no bronchitis, no wheezing, no hemoptysis, + asthma, no pain with inspiration or cough, no sleep apnea, no CPAP at night             GI:                               no difficulty swallowing, no reflux, no frequent heartburn, no hiatal hernia, no abdominal pain, no constipation, no diarrhea, no hematochezia, no hematemesis, no melena             GU:                              no dysuria,  no frequency, no urinary tract infection, no hematuria, + enlarged prostate, no kidney stones, no kidney disease             Vascular:                     no pain suggestive of claudication, no pain in feet, + leg cramps, no varicose veins, no DVT, no non-healing foot ulcer             Neuro:                         no stroke, no TIA's,  no seizures, no headaches, no temporary blindness one eye,  no slurred speech, no peripheral neuropathy, no chronic pain, no instability of gait, no memory/cognitive dysfunction             Musculoskeletal:  no arthritis,  no joint swelling, no myalgias, no difficulty walking, normal mobility              Skin:                            no rash, no itching, no skin infections, no pressure sores or ulcerations             Psych:                         n anxiety, ono depression, no nervousness, + unusual recent stress             Eyes:                           no blurry vision, no floaters, no recent vision changes, + wears glasses              ENT:                            no hearing loss, no loose or painful teeth, no dentures, last saw dentist 12/2020             Hematologic:               no easy bruising, no abnormal bleeding, no clotting disorder, no frequent epistaxis             Endocrine:                   no diabetes, does not check CBG's at home                            Physical Exam:               BP 132/83 (BP Location: Right Arm, Patient Position: Sitting)   Pulse 89   Resp 20   Ht '6\' 2"'$  (1.88 m)   SpO2 93% Comment: RA  BMI 29.84 kg/m              General:                      well-appearing             HEENT:                       Unremarkable, NCAT, PERLA, EOMI             Neck:                           no JVD, no bruits, no adenopathy              Chest:                          clear to auscultation, symmetrical breath sounds, no wheezes, no rhonchi              CV:                              RRR, 3/6 systolic murmur RSB, no diastolic murmur  Abdomen:                    soft, non-tender, no masses              Extremities:                 warm, well-perfused, pulses palpable at ankles, no lower extremity edema             Rectal/GU                   Deferred             Neuro:                         Grossly non-focal and symmetrical throughout              Skin:                            Clean and dry, no rashes, no breakdown   Diagnostic Tests:   Lab Results: Recent Labs (last 2 labs)      Recent Labs    01/30/21 1604  WBC 7.6  HGB 15.5  HCT 43.5  PLT 232      BMET:  Recent Labs (last 2 labs)      Recent Labs    01/30/21 1604  NA 136  K 4.0  CL 103  CO2 23  GLUCOSE 106*  BUN 11  CREATININE 0.83  CALCIUM 9.2      CBG (last 3)  Recent Labs (last 2 labs)   No results for input(s): GLUCAP in the last 72 hours.   PT/INR:   Recent Labs (last 2 labs)      Recent Labs    01/30/21 1604  LABPROT 12.3  INR 0.9      ECHOCARDIOGRAM REPORT         Patient Name:   Danny Johnson Date of Exam: 12/15/2020  Medical Rec #:  [id number]         Height:       73.5 in  Accession #:    [id number]        Weight:       233.0 lb  Date of Birth:  [birth year]         BSA:          2.308 m  Patient Age:    6 years          BP:           126/70 mmHg  Patient Gender: M                 HR:           75 bpm.  Exam Location:  [facility]   Procedure: 2D Echo, Cardiac Doppler and Color Doppler   Indications:    I35.0 (ICD-10-CM) - Nonrheumatic aortic valve stenosis     History:        Patient has prior history of Echocardiogram examinations,  most                  recent 06/16/2020. Arrythmias:PVC; Risk  Factors:Hypertension,                  Dyslipidemia, Former Smoker and Prediabetes. Aortic  stenosis  with bicuspid valve.     Sonographer:    Alvino Chapel RCS  Referring Phys: [place]     1. Left ventricular ejection fraction, by estimation, is 55 to 60%. The  left ventricle has normal function. The left ventricle has no regional  wall motion abnormalities. There is moderate left ventricular hypertrophy.  Left ventricular diastolic  parameters are consistent with Grade I diastolic dysfunction (impaired  relaxation).   2. Right ventricular systolic function is  normal. The right ventricular  size is normal.   3. The mitral valve is normal in structure. No evidence of mitral valve  regurgitation. No evidence of mitral stenosis.   4. Mean gradient 31 mmHg, AVA VTI 0.59, DI 0.19, SVI 20. Criteria would  be consistent with paradoxical low flow low gradient severe aortic  stenosis, lower than expected gradient given decreased stroke volume  index. . The aortic valve is tricuspid. There   is severe calcifcation of the aortic valve. There is severe thickening of  the aortic valve. Aortic valve regurgitation is mild. Severe aortic valve  stenosis.   5. The inferior vena cava is normal in size with greater than 50%  respiratory variability, suggesting right atrial pressure of 3 mmHg.   FINDINGS   Left Ventricle: Left ventricular ejection fraction, by estimation, is 55  to 60%. The left ventricle has normal function. The left ventricle has no  regional wall motion abnormalities. The left ventricular internal cavity  size was normal in size. There is   moderate left ventricular hypertrophy. Left ventricular diastolic  parameters are consistent with Grade I diastolic dysfunction (impaired  relaxation).   Right Ventricle: The right ventricular size is normal. No increase in  right ventricular wall thickness. Right ventricular systolic function is  normal.   Left Atrium: Left atrial size was normal in size.   Right Atrium: Right atrial size was normal in size.   Pericardium: There is no evidence of pericardial effusion.   Mitral Valve: The mitral valve is normal in structure. No evidence of  mitral valve regurgitation. No evidence of mitral valve stenosis.   Tricuspid Valve: The tricuspid valve is normal in structure. Tricuspid  valve regurgitation is not demonstrated. No evidence of tricuspid  stenosis.   Aortic Valve: Mean gradient 31 mmHg, AVA VTI 0.59, DI 0.19, SVI 20.  Criteria would be consistent with paradoxical low flow low gradient  severe  aortic stenosis, lower than expected gradient given decreased stroke  volume index. The aortic valve is  tricuspid. There is severe calcifcation of the aortic valve. There is  severe thickening of the aortic valve. There is severe aortic valve  annular calcification. Aortic valve regurgitation is mild. Aortic  regurgitation PHT measures 503 msec. Severe aortic   stenosis is present. Aortic valve mean gradient measures 31.0 mmHg.  Aortic valve peak gradient measures 49.0 mmHg. Aortic valve area, by VTI  measures 0.59 cm.   Pulmonic Valve: The pulmonic valve was not well visualized. Pulmonic valve  regurgitation is not visualized. No evidence of pulmonic stenosis.   Aorta: The aortic root is normal in size and structure.   Venous: The inferior vena cava is normal in size with greater than 50%  respiratory variability, suggesting right atrial pressure of 3 mmHg.   IAS/Shunts: No atrial level shunt detected by color flow Doppler.      LEFT VENTRICLE  PLAX 2D  LVIDd:         5.00 cm  Diastology  LVIDs:         2.70 cm  LV e' medial:    5.87 cm/s  LV PW:         1.40 cm  LV E/e' medial:  8.5  LV IVS:        1.30 cm  LV e' lateral:   7.94 cm/s  LVOT diam:     2.00 cm  LV E/e' lateral: 6.3  LV SV:         45  LV SV Index:   20  LVOT Area:     3.14 cm      RIGHT VENTRICLE  RV S prime:     11.50 cm/s  TAPSE (M-mode): 2.1 cm   LEFT ATRIUM             Index       RIGHT ATRIUM           Index  LA diam:        4.20 cm 1.82 cm/m  RA Area:     19.40 cm  LA Vol (A2C):   65.0 ml 28.17 ml/m RA Volume:   51.90 ml  22.49 ml/m  LA Vol (A4C):   63.3 ml 27.43 ml/m  LA Biplane Vol: 63.9 ml 27.69 ml/m   AORTIC VALVE  AV Area (Vmax):    0.59 cm  AV Area (Vmean):   0.52 cm  AV Area (VTI):     0.59 cm  AV Vmax:           350.00 cm/s  AV Vmean:          246.250 cm/s  AV VTI:            0.764 m  AV Peak Grad:      49.0 mmHg  AV Mean Grad:      31.0 mmHg  LVOT Vmax:          65.30 cm/s  LVOT Vmean:        40.500 cm/s  LVOT VTI:          0.144 m  LVOT/AV VTI ratio: 0.19  AI PHT:            503 msec     AORTA  Ao Root diam: 3.50 cm   MITRAL VALVE  MV Area (PHT): 2.95 cm    SHUNTS  MV Decel Time: 257 msec    Systemic VTI:  0.14 m  MV E velocity: 49.90 cm/s  Systemic Diam: 2.00 cm  MV A velocity: 65.80 cm/s  MV E/A ratio:  0.76   Carlyle Dolly MD  Electronically signed by Carlyle Dolly MD  Signature Date/Time: 12/15/2020/10:44:14 AM         Final       Physicians   Panel Physicians Referring Physician Case Authorizing Physician  Burnell Blanks, MD (Primary)        Procedures   RIGHT/LEFT HEART CATH AND CORONARY ANGIOGRAPHY    Conclusion       Prox RCA lesion is 20% stenosed.   Mid Cx lesion is 20% stenosed.   Prox LAD to Mid LAD lesion is 20% stenosed.   Mild non-obstructive CAD Severe aortic stenosis by echo (paradoxical low flow/low gradient severe AS). Cath mean gradient 33.7 mmHg, peak to peak gradient 39 mmHg.    Recommendations: Continue workup for TAVR   Indications   Severe aortic stenosis [I35.0 (ICD-10-CM)]    Procedural Details   Technical Details Indication: Severe aortic stenosis, workup for TAVR  Procedure: The risks, benefits, complications, treatment options, and expected outcomes were discussed with the patient. The patient and/or family concurred with the proposed plan, giving informed consent. The patient was brought to the cath lab after IV hydration was given. The patient was further sedated with Versed and Fentanyl. The right antecubital area was prepped and draped and u/s guided access into the right antecubital vein. A 5 French sheath was placed into the right antecubital vein. Right heart cath performed with a balloon tipped catheter. The right wrist was prepped and draped in a sterile fashion. 1% lidocaine was used for local anesthesia. Using the modified Seldinger access technique, a 5 French  sheath was placed in the right radial artery. 3 mg Verapamil was given through the sheath.  5000 units IV heparin was given. Standard diagnostic catheters were used to perform selective coronary angiography. The aortic valve was crossed with an AL-1 and the J wire. The sheath was removed from the right radial artery and a Terumo hemostasis band was applied at the arteriotomy site on the right wrist.      Estimated blood loss <50 mL.   During this procedure medications were administered to achieve and maintain moderate conscious sedation while the patient's heart rate, blood pressure, and oxygen saturation were continuously monitored and I was present face-to-face 100% of this time.    Medications (Filter: Administrations occurring from [phone] to 0931 on 01/12/21)  important  Continuous medications are totaled by the amount administered until 01/12/21 0931.    Heparin (Porcine) in NaCl 1000-0.9 UT/500ML-% SOLN (mL) Total volume:  1,000 mL Date/Time Rate/Dose/Volume Action    01/12/21 0841 500 mL Given    0841 500 mL Given      fentaNYL (SUBLIMAZE) injection (mcg) Total dose:  50 mcg Date/Time Rate/Dose/Volume Action    01/12/21 0851 50 mcg Given      midazolam (VERSED) injection (mg) Total dose:  2 mg Date/Time Rate/Dose/Volume Action    01/12/21 0851 2 mg Given      lidocaine (PF) (XYLOCAINE) 1 % injection (mL) Total volume:  4 mL Date/Time Rate/Dose/Volume Action    01/12/21 0859 2 mL Given    0900 2 mL Given      heparin sodium (porcine) injection (Units) Total dose:  5,000 Units Date/Time Rate/Dose/Volume Action    01/12/21 0911 5,000 Units Given      Radial Cocktail/Verapamil only (mL) Total volume:  10 mL Date/Time Rate/Dose/Volume Action    01/12/21 0907 10 mL Given      iohexol (OMNIPAQUE) 350 MG/ML injection (mL) Total volume:  80 mL Date/Time Rate/Dose/Volume Action    01/12/21 0930 80 mL Given      Sedation Time   Sedation Time Physician-1: 34 minutes 15  seconds Radiation/Fluoro   Fluoro time: 4.9 (min) DAP: 14890 (mGycm2) Cumulative Air Kerma: 234 (mGy) Coronary Findings   Diagnostic Dominance: Right Left Anterior Descending  Vessel is large.  Prox LAD to Mid LAD lesion is 20% stenosed.  Left Circumflex  Vessel is large.  Mid Cx lesion is 20% stenosed.  Right Coronary Artery  Vessel is large.  Prox RCA lesion is 20% stenosed.  Intervention   No interventions have been documented. Coronary Diagrams   Diagnostic Dominance: Right Intervention   Implants      No implant documentation for this case.    Syngo Images    Show images for CARDIAC CATHETERIZATION Images on Long Term Storage    Show images for Oconnor, Erker to Procedure  Log   Procedure Log    Hemo Data   Flowsheet Row Most Recent Value  Fick Cardiac Output 6.81 L/min  Fick Cardiac Output Index 2.98 (L/min)/BSA  Aortic Mean Gradient 33.67 mmHg  Aortic Peak Gradient 39 mmHg  Aortic Valve Area 1.16  Aortic Value Area Index 0.51 cm2/BSA  RA A Wave 3 mmHg  RA V Wave 2 mmHg  RA Mean 2 mmHg  RV Systolic Pressure 29 mmHg  RV Diastolic Pressure 0 mmHg  RV EDP 2 mmHg  PA Systolic Pressure 19 mmHg  PA Diastolic Pressure 8 mmHg  PA Mean 13 mmHg  PW A Wave 9 mmHg  PW V Wave 9 mmHg  PW Mean 7 mmHg  AO Systolic Pressure 123456 mmHg  AO Diastolic Pressure 68 mmHg  AO Mean 85 mmHg  LV Systolic Pressure 123456 mmHg  LV Diastolic Pressure 4 mmHg  LV EDP 9 mmHg  AOp Systolic Pressure [id number] mmHg  AOp Diastolic Pressure 66 mmHg  AOp Mean Pressure 88 mmHg  LVp Systolic Pressure 123456 mmHg  LVp Diastolic Pressure 5 mmHg  LVp EDP Pressure 9 mmHg  QP/QS 1  TPVR Index 4.36 HRUI  TSVR Index 29.51 HRUI  PVR SVR Ratio 0.07  TPVR/TSVR Ratio 0.15      ADDENDUM REPORT: 01/17/2021 10:46   CLINICAL DATA:  Pre-op transcatheter aortic valve replacement (TAVR)   EXAM: Cardiac TAVR CT   TECHNIQUE: The patient was scanned on a Siemens Force [id number] slice scanner. A  120 kV retrospective scan was triggered in the descending thoracic aorta at 111 HU's. Gantry rotation speed was 270 msecs and collimation was .9 mm. The 3D data set was reconstructed in 5% intervals of the R-R cycle. Systolic and diastolic phases were analyzed on a dedicated work station using MPR, MIP and VRT modes. The patient received 167m OMNIPAQUE IOHEXOL 350 MG/ML SOLN of contrast.   FINDINGS: Aortic Valve: Tricuspid aortic valve. Severely reduced cusp separation. Severely thickened, severely calcified aortic valve cusps.   AV calcium score: 3230   Virtual Basal Annulus Measurements:   Maximum/Minimum Diameter: 30.3 x 27.3 mm   Perimeter: 89.3 mm   Area:  622 mm2   Prominent LVOT calcifications below RCC, protruding into annulus, and NCC.   Based on these measurements, the annulus would be suitable for a 29 mm Sapien 3 valve.   Sinus of Valsalva Measurements:   Non-coronary:  30 mm   Right - coronary:  28 mm   Left - coronary:  31 mm   Sinus of Valsalva Height:   Left: 18.2 mm   Right: 23.5 mm   Aorta: Conventional 3 vessel branch pattern of aortic arch. Minimal aortic atherosclerosis.   Sinotubular Junction: 29 mm   Ascending Thoracic Aorta:  32 mm   Aortic Arch:  27 mm   Descending Thoracic Aorta:  27 mm   Coronary Artery Height above Annulus:   Left Main: 11.6 mm   Right Coronary: 15.9 mm   Coronary Arteries: Normal coronary artery origins, 3 vessel coronary artery calcifications.   Optimum Fluoroscopic Angle for Delivery: LAO 9, CAU 10   No LA appendage thrombus.   IMPRESSION: 1. Tricuspid aortic valve. Severely reduced cusp separation. Severely thickened, severely calcified aortic valve cusps.   2. AV calcium score: 3230   3. Annulus Area: 622 mm2. The annulus would be suitable for a 29 mm Sapien 3 valve.   4. Prominent LVOT calcifications below RCC, protruding into annulus, and NCC.   5. Sufficient coronary  artery heights  from annulus.   6. Optimum Fluoroscopic Angle for Delivery: LAO 9, CAU 10     Electronically Signed   By: Cherlynn Kaiser M.D.   On: 01/17/2021 10:46    Addended by Elouise Munroe, MD on 01/17/2021 10:49 AM    Study Result   Narrative & Impression  EXAM: OVER-READ INTERPRETATION  CT CHEST   The following report is an over-read performed by radiologist Dr. Vinnie Langton of [facility], [place] on 01/15/2021. This over-read does not include interpretation of cardiac or coronary anatomy or pathology. The coronary calcium score/coronary CTA interpretation by the cardiologist is attached.   COMPARISON:  None.   FINDINGS: Extracardiac findings will be described separately under dictation for contemporaneously obtained CTA chest, abdomen and pelvis.   IMPRESSION: Please see separate dictation for contemporaneously obtained CTA chest, abdomen and pelvis dated 01/15/2021 for full description of relevant extracardiac findings.   Electronically Signed: By: Vinnie Langton M.D. On: 01/15/2021 10:52        Narrative & Impression  CLINICAL DATA:  70 year old male with history of severe aortic stenosis. Preprocedural study prior to potential transcatheter aortic valve replacement (TAVR) procedure.   EXAM: CT ANGIOGRAPHY CHEST, ABDOMEN AND PELVIS   TECHNIQUE: Multidetector CT imaging through the chest, abdomen and pelvis was performed using the standard protocol during bolus administration of intravenous contrast. Multiplanar reconstructed images and MIPs were obtained and reviewed to evaluate the vascular anatomy.   CONTRAST:  159m OMNIPAQUE IOHEXOL 350 MG/ML SOLN   COMPARISON:  None.   FINDINGS: CTA CHEST FINDINGS   Cardiovascular: Heart size is normal. There is no significant pericardial fluid, thickening or pericardial calcification. There is aortic atherosclerosis, as well as atherosclerosis of the great vessels of the mediastinum and the coronary  arteries, including calcified atherosclerotic plaque in the left anterior descending, left circumflex and right coronary arteries. Severe thickening and calcification of the aortic valve.   Mediastinum/Lymph Nodes: No pathologically enlarged mediastinal or hilar lymph nodes. Small hiatal hernia. No axillary lymphadenopathy.   Lungs/Pleura: No suspicious appearing pulmonary nodules or masses are noted. No acute consolidative airspace disease. No pleural effusions.   Musculoskeletal/Soft Tissues: There are no aggressive appearing lytic or blastic lesions noted in the visualized portions of the skeleton.   CTA ABDOMEN AND PELVIS FINDINGS   Hepatobiliary: Subcentimeter low-attenuation lesion in segment 4A of the liver, too small to characterize, but statistically likely to represent a tiny cyst. No other suspicious hepatic lesions. No intra or extrahepatic biliary ductal dilatation. Gallbladder is normal in appearance.   Pancreas: No pancreatic mass. No pancreatic ductal dilatation. No pancreatic or peripancreatic fluid collections or inflammatory changes.   Spleen: Unremarkable.   Adrenals/Urinary Tract: Exophytic 2.3 cm low-attenuation lesion in the upper pole the left kidney compatible with a simple cyst. Adjacent to this there is a subcentimeter low-attenuation lesion, too small to characterize, but statistically likely to represent a small cyst. Right kidney and bilateral adrenal glands are normal in appearance. No hydroureteronephrosis. Urinary bladder is unremarkable in appearance.   Stomach/Bowel: The appearance of the stomach is normal. No pathologic dilatation of small bowel or colon. A few scattered colonic diverticulae are noted, without surrounding inflammatory changes to suggest an acute diverticulitis at this time. Normal appendix.   Vascular/Lymphatic: Vascular findings and measurements pertinent to potential TAVR procedure, as detailed below. Aortic  atherosclerosis, without evidence of aneurysm or dissection in the abdominal or pelvic vasculature. No lymphadenopathy noted in the abdomen or pelvis.   Reproductive:  Prostate gland and seminal vesicles are unremarkable in appearance.   Other: No significant volume of ascites.  No pneumoperitoneum.   Musculoskeletal: Bilateral pars defects at L5. 6 mm of anterolisthesis of L5 upon S1. There are no aggressive appearing lytic or blastic lesions noted in the visualized portions of the skeleton.   VASCULAR MEASUREMENTS PERTINENT TO TAVR:   AORTA:   Minimal Aortic Diameter-17 x 17 mm   Severity of Aortic Calcification-mild   RIGHT PELVIS:   Right Common Iliac Artery -   Minimal Diameter-10.5 x 9.7 mm   Tortuosity-mild-to-moderate   Calcification-none   Right External Iliac Artery -   Minimal Diameter-6.9 x 7.0 mm   Tortuosity-moderate to severe   Calcification-none   Right Common Femoral Artery -   Minimal Diameter-9.1 x 8.9 mm   Tortuosity-mild   Calcification-none   LEFT PELVIS:   Left Common Iliac Artery -   Minimal Diameter-8.4 x 10.2 mm   Tortuosity-moderate   Calcification-mild   Left External Iliac Artery -   Minimal Diameter-7.2 x 6.6 mm   Tortuosity-mild to moderate   Calcification-none   Left Common Femoral Artery -   Minimal Diameter-8.1 x 8.5 mm   Tortuosity-mild   Calcification-none   Review of the MIP images confirms the above findings.   IMPRESSION: 1. Vascular findings and measurements pertinent to potential TAVR procedure, as detailed above. 2. Severe thickening calcification of the aortic valve, compatible with reported clinical history of severe aortic stenosis. 3. Aortic atherosclerosis, in addition to 3 vessel coronary artery disease. Assessment for potential risk factor modification, dietary therapy or pharmacologic therapy may be warranted, if clinically indicated. 4. Colonic diverticulosis without evidence of  acute diverticulitis at this time. 5. Additional incidental findings, as above.     Electronically Signed   By: Vinnie Langton M.D.   On: 01/15/2021 11:34          STS Risk Score: Procedure: AVR + CAB Risk of Mortality: 0.944% Renal Failure: 0.770% Permanent Stroke: 0.972% Prolonged Ventilation: 4.672% DSW Infection: 0.111% Reoperation: 3.446% Morbidity or Mortality: 8.119% Short Length of Stay: 57.286% Long Length of Stay: 2.661%   Impression:   This 70 year old gentleman has stage D3 paradoxical low-flow, low gradient severe aortic stenosis with New York Heart Association class II symptoms of exertional fatigue and shortness of breath consistent with chronic diastolic congestive heart failure.  I personally reviewed his 2D echo, cardiac catheterization, and CTA studies.  His echocardiogram shows a heavily calcified aortic valve with thickening and restricted leaflet mobility.  Visually it looks like a severely stenotic valve.  The mean gradient was 31 mm with a valve area of 0.59 cm, dimensionless index of 0.19, and low stroke-volume index of 20.  Cardiac catheterization shows mild nonobstructive coronary disease.  The mean gradient across aortic valve was measured at 34 mmHg with a peak to peak gradient of 39 mmHg.  I agree that aortic valve replacement is indicated in this patient for relief of his progressive symptoms and to prevent progressive left ventricular deterioration.  He has mild nonobstructive coronary disease on catheterization and given his age I think transcatheter aortic valve replacement is a reasonable option for treating him.  His gated cardiac CTA shows anatomy suitable for TAVR using a 29 mm SAPIEN 3 valve.  His abdominal and pelvic CTA shows adequate pelvic vascular anatomy to allow transfemoral insertion.   The patient was counseled at length regarding treatment alternatives for management of severe symptomatic aortic stenosis. The risks and benefits  of surgical intervention has been discussed in detail. Long-term prognosis with medical therapy was discussed. Alternative approaches such as conventional surgical aortic valve replacement, transcatheter aortic valve replacement, and palliative medical therapy were compared and contrasted at length. This discussion was placed in the context of the patient's own specific clinical presentation and past medical history. All of his questions have been addressed.    Following the decision to proceed with transcatheter aortic valve replacement, a discussion was held regarding what types of management strategies would be attempted intraoperatively in the event of life-threatening complications, including whether or not the patient would be considered a candidate for the use of cardiopulmonary bypass and/or conversion to open sternotomy for attempted surgical intervention.  I think he is a candidate for emergent sternotomy to manage any intraoperative complications.  The patient is aware of the fact that transient use of cardiopulmonary bypass may be necessary. The patient has been advised of a variety of complications that might develop including but not limited to risks of death, stroke, paravalvular leak, aortic dissection or other major vascular complications, aortic annulus rupture, device embolization, cardiac rupture or perforation, mitral regurgitation, acute myocardial infarction, arrhythmia, heart block or bradycardia requiring permanent pacemaker placement, congestive heart failure, respiratory failure, renal failure, pneumonia, infection, other late complications related to structural valve deterioration or migration, or other complications that might ultimately cause a temporary or permanent loss of functional independence or other long term morbidity. The patient provides full informed consent for the procedure as described and all questions were answered.       Plan:   He will be scheduled for  transfemoral transcatheter aortic valve replacement using a SAPIEN 3 valve.

## 2021-02-03 ENCOUNTER — Inpatient Hospital Stay (HOSPITAL_COMMUNITY)
Admission: RE | Admit: 2021-02-03 | Discharge: 2021-02-04 | DRG: 266 | Disposition: A | Discharge status: Home / Self Care | Payer: Medicare Other | Attending: Surgery | Admitting: Surgery

## 2021-02-03 ENCOUNTER — Inpatient Hospital Stay (HOSPITAL_COMMUNITY): Payer: Medicare Other | Admitting: Certified Registered Nurse Anesthetist

## 2021-02-03 ENCOUNTER — Ambulatory Visit (HOSPITAL_COMMUNITY)
Admission: RE | Admit: 2021-02-03 | Discharge: 2021-02-03 | Disposition: A | Discharge status: Home / Self Care | Payer: Medicare Other | Source: Ambulatory Visit | Attending: Cardiovascular Disease | Admitting: Cardiovascular Disease

## 2021-02-03 ENCOUNTER — Inpatient Hospital Stay (HOSPITAL_COMMUNITY): Payer: Medicare Other | Admitting: Vascular Surgery

## 2021-02-03 ENCOUNTER — Other Ambulatory Visit: Payer: Self-pay

## 2021-02-03 ENCOUNTER — Encounter (HOSPITAL_COMMUNITY): Payer: Self-pay | Admitting: Cardiovascular Disease

## 2021-02-03 ENCOUNTER — Other Ambulatory Visit: Payer: Self-pay | Admitting: Cardiology

## 2021-02-03 ENCOUNTER — Encounter (HOSPITAL_COMMUNITY)
Admission: RE | Disposition: A | Discharge status: Home / Self Care | Payer: Medicare Other | Source: Home / Self Care | Attending: Surgery

## 2021-02-03 DIAGNOSIS — I350 Nonrheumatic aortic (valve) stenosis: Secondary | ICD-10-CM

## 2021-02-03 DIAGNOSIS — I10 Essential (primary) hypertension: Secondary | ICD-10-CM | POA: Diagnosis present

## 2021-02-03 DIAGNOSIS — I110 Hypertensive heart disease with heart failure: Secondary | ICD-10-CM | POA: Diagnosis present

## 2021-02-03 DIAGNOSIS — Z6829 Body mass index (BMI) 29.0-29.9, adult: Secondary | ICD-10-CM | POA: Diagnosis not present

## 2021-02-03 DIAGNOSIS — G4730 Sleep apnea, unspecified: Secondary | ICD-10-CM | POA: Diagnosis not present

## 2021-02-03 DIAGNOSIS — Z91048 Other nonmedicinal substance allergy status: Secondary | ICD-10-CM | POA: Diagnosis not present

## 2021-02-03 DIAGNOSIS — I352 Nonrheumatic aortic (valve) stenosis with insufficiency: Secondary | ICD-10-CM | POA: Diagnosis not present

## 2021-02-03 DIAGNOSIS — E782 Mixed hyperlipidemia: Secondary | ICD-10-CM | POA: Diagnosis present

## 2021-02-03 DIAGNOSIS — I5033 Acute on chronic diastolic (congestive) heart failure: Secondary | ICD-10-CM

## 2021-02-03 DIAGNOSIS — E785 Hyperlipidemia, unspecified: Secondary | ICD-10-CM | POA: Insufficient documentation

## 2021-02-03 DIAGNOSIS — I2510 Atherosclerotic heart disease of native coronary artery without angina pectoris: Secondary | ICD-10-CM | POA: Insufficient documentation

## 2021-02-03 DIAGNOSIS — E669 Obesity, unspecified: Secondary | ICD-10-CM | POA: Diagnosis present

## 2021-02-03 DIAGNOSIS — Z954 Presence of other heart-valve replacement: Secondary | ICD-10-CM | POA: Diagnosis not present

## 2021-02-03 DIAGNOSIS — Z006 Encounter for examination for normal comparison and control in clinical research program: Secondary | ICD-10-CM

## 2021-02-03 DIAGNOSIS — E66811 Obesity, class 1: Secondary | ICD-10-CM | POA: Diagnosis present

## 2021-02-03 DIAGNOSIS — Z952 Presence of prosthetic heart valve: Secondary | ICD-10-CM

## 2021-02-03 HISTORY — PX: TRANSCATHETER AORTIC VALVE REPLACEMENT, TRANSFEMORAL: SHX6400

## 2021-02-03 HISTORY — DX: Presence of prosthetic heart valve: Z95.2

## 2021-02-03 HISTORY — PX: INTRAOPERATIVE TRANSTHORACIC ECHOCARDIOGRAM: SHX6523

## 2021-02-03 HISTORY — PX: ULTRASOUND GUIDANCE FOR VASCULAR ACCESS: SHX6516

## 2021-02-03 LAB — POCT I-STAT 7, (LYTES, BLD GAS, ICA,H+H)
Acid-Base Excess: 1 mmol/L (ref 0.0–2.0)
Bicarbonate: 27.7 mmol/L (ref 20.0–28.0)
Calcium, Ion: 1.22 mmol/L (ref 1.15–1.40)
HCT: 40 % (ref 39.0–52.0)
Hemoglobin: 13.6 g/dL (ref 13.0–17.0)
O2 Saturation: 100 %
Potassium: 3.9 mmol/L (ref 3.5–5.1)
Sodium: 139 mmol/L (ref 135–145)
TCO2: 29 mmol/L (ref 22–32)
pCO2 arterial: 49.1 mmHg — ABNORMAL HIGH (ref 32.0–48.0)
pH, Arterial: 7.36 (ref 7.350–7.450)
pO2, Arterial: 241 mmHg — ABNORMAL HIGH (ref 83.0–108.0)

## 2021-02-03 LAB — POCT I-STAT, CHEM 8
BUN: 11 mg/dL (ref 8–23)
BUN: 10 mg/dL (ref 8–23)
Calcium, Ion: 1.2 mmol/L (ref 1.15–1.40)
Calcium, Ion: 1.12 mmol/L — ABNORMAL LOW (ref 1.15–1.40)
Chloride: 102 mmol/L (ref 98–111)
Chloride: 104 mmol/L (ref 98–111)
Creatinine, Ser: 0.6 mg/dL — ABNORMAL LOW (ref 0.61–1.24)
Creatinine, Ser: 0.6 mg/dL — ABNORMAL LOW (ref 0.61–1.24)
Glucose, Bld: 135 mg/dL — ABNORMAL HIGH (ref 70–99)
Glucose, Bld: 129 mg/dL — ABNORMAL HIGH (ref 70–99)
HCT: 41 % (ref 39.0–52.0)
HCT: 37 % — ABNORMAL LOW (ref 39.0–52.0)
Hemoglobin: 13.9 g/dL (ref 13.0–17.0)
Hemoglobin: 12.6 g/dL — ABNORMAL LOW (ref 13.0–17.0)
Potassium: 3.9 mmol/L (ref 3.5–5.1)
Potassium: 4.4 mmol/L (ref 3.5–5.1)
Sodium: 139 mmol/L (ref 135–145)
Sodium: 137 mmol/L (ref 135–145)
TCO2: 26 mmol/L (ref 22–32)
TCO2: 28 mmol/L (ref 22–32)

## 2021-02-03 LAB — ECHOCARDIOGRAM LIMITED
AR max vel: 3.89 cm2
AV Area VTI: 3.45 cm2
AV Area mean vel: 3.46 cm2
AV Mean grad: 1.5 mmHg
AV Peak grad: 2.8 mmHg
Ao pk vel: 0.84 m/s

## 2021-02-03 LAB — ABO/RH: ABO/RH(D): O POS

## 2021-02-03 SURGERY — IMPLANTATION, AORTIC VALVE, TRANSCATHETER, FEMORAL APPROACH
Anesthesia: General | Site: Groin

## 2021-02-03 MED ORDER — ONDANSETRON HCL 4 MG/2ML IJ SOLN: 4.0000 mg | Freq: Four times a day (QID) | INTRAMUSCULAR | Status: DC | PRN

## 2021-02-03 MED ORDER — HEPARIN 6000 UNIT IRRIGATION SOLUTION
Status: DC | PRN
  Administered 2021-02-03 (×3): 1

## 2021-02-03 MED ORDER — LACTATED RINGERS IV SOLN: INTRAVENOUS | Status: DC

## 2021-02-03 MED ORDER — MORPHINE SULFATE (PF) 2 MG/ML IV SOLN: 1.0000 mg | INTRAVENOUS | Status: DC | PRN

## 2021-02-03 MED ORDER — IODIXANOL 320 MG/ML IV SOLN
INTRAVENOUS | Status: DC | PRN
  Administered 2021-02-03: 60 mL via INTRA_ARTERIAL

## 2021-02-03 MED ORDER — SODIUM CHLORIDE 0.9 % IV SOLN: INTRAVENOUS | Status: AC

## 2021-02-03 MED ORDER — ACETAMINOPHEN 325 MG PO TABS
650.0000 mg | ORAL_TABLET | Freq: Four times a day (QID) | ORAL | Status: DC | PRN
  Administered 2021-02-03 – 2021-02-04 (×2): 650 mg via ORAL
  Filled 2021-02-03 (×2): qty 2

## 2021-02-03 MED ORDER — LIDOCAINE HCL (PF) 1 % IJ SOLN
INTRAMUSCULAR | Status: DC | PRN
  Administered 2021-02-03: 30 mL

## 2021-02-03 MED ORDER — ONDANSETRON HCL 4 MG/2ML IJ SOLN
INTRAMUSCULAR | Status: DC | PRN
  Administered 2021-02-03: 4 mg via INTRAVENOUS

## 2021-02-03 MED ORDER — FENTANYL CITRATE (PF) 250 MCG/5ML IJ SOLN
INTRAMUSCULAR | Status: AC
  Filled 2021-02-03: qty 5

## 2021-02-03 MED ORDER — FUROSEMIDE 10 MG/ML IJ SOLN
INTRAMUSCULAR | Status: AC
  Filled 2021-02-03: qty 4

## 2021-02-03 MED ORDER — SODIUM CHLORIDE 0.9% FLUSH: 3.0000 mL | INTRAVENOUS | Status: DC | PRN

## 2021-02-03 MED ORDER — ATORVASTATIN CALCIUM 10 MG PO TABS
10.0000 mg | ORAL_TABLET | Freq: Every day | ORAL | Status: DC
  Administered 2021-02-04: 10 mg via ORAL
  Filled 2021-02-03: qty 1

## 2021-02-03 MED ORDER — FENTANYL CITRATE (PF) 100 MCG/2ML IJ SOLN
INTRAMUSCULAR | Status: DC | PRN
  Administered 2021-02-03: 100 ug via INTRAVENOUS

## 2021-02-03 MED ORDER — FUROSEMIDE 10 MG/ML IJ SOLN
40.0000 mg | Freq: Once | INTRAMUSCULAR | Status: AC
  Administered 2021-02-03: 40 mg via INTRAVENOUS
  Filled 2021-02-03: qty 4

## 2021-02-03 MED ORDER — CLOPIDOGREL BISULFATE 75 MG PO TABS
75.0000 mg | ORAL_TABLET | Freq: Every day | ORAL | Status: DC
  Administered 2021-02-04: 75 mg via ORAL
  Filled 2021-02-03: qty 1

## 2021-02-03 MED ORDER — PROTAMINE SULFATE 10 MG/ML IV SOLN
INTRAVENOUS | Status: AC
  Filled 2021-02-03: qty 50

## 2021-02-03 MED ORDER — PHENYLEPHRINE HCL-NACL 20-0.9 MG/250ML-% IV SOLN
0.0000 ug/min | INTRAVENOUS | Status: DC
Start: 2021-02-03 — End: 2021-02-03

## 2021-02-03 MED ORDER — ROCURONIUM BROMIDE 100 MG/10ML IV SOLN
INTRAVENOUS | Status: DC | PRN
  Administered 2021-02-03: 40 mg via INTRAVENOUS

## 2021-02-03 MED ORDER — CHLORHEXIDINE GLUCONATE 4 % EX LIQD: 60.0000 mL | Freq: Once | CUTANEOUS | Status: DC

## 2021-02-03 MED ORDER — HEPARIN SODIUM (PORCINE) 1000 UNIT/ML IJ SOLN
INTRAMUSCULAR | Status: DC | PRN
  Administered 2021-02-03: 16000 [IU] via INTRAVENOUS

## 2021-02-03 MED ORDER — VENLAFAXINE HCL ER 37.5 MG PO CP24
37.5000 mg | ORAL_CAPSULE | Freq: Every day | ORAL | Status: DC
  Administered 2021-02-04: 37.5 mg via ORAL
  Filled 2021-02-03: qty 1

## 2021-02-03 MED ORDER — SODIUM CHLORIDE 0.9 % IV SOLN: 250.0000 mL | INTRAVENOUS | Status: DC

## 2021-02-03 MED ORDER — SODIUM CHLORIDE 0.9% FLUSH
3.0000 mL | Freq: Two times a day (BID) | INTRAVENOUS | Status: DC
  Administered 2021-02-04: 3 mL via INTRAVENOUS

## 2021-02-03 MED ORDER — SODIUM CHLORIDE 0.9 % IV SOLN: INTRAVENOUS | Status: DC | PRN

## 2021-02-03 MED ORDER — PROTAMINE SULFATE 10 MG/ML IV SOLN
INTRAVENOUS | Status: DC | PRN
  Administered 2021-02-03: 160 mg via INTRAVENOUS

## 2021-02-03 MED ORDER — ASPIRIN EC 81 MG PO TBEC
81.0000 mg | DELAYED_RELEASE_TABLET | ORAL | Status: DC
  Administered 2021-02-04: 81 mg via ORAL
  Filled 2021-02-03: qty 1

## 2021-02-03 MED ORDER — SUCCINYLCHOLINE CHLORIDE 200 MG/10ML IV SOSY
PREFILLED_SYRINGE | INTRAVENOUS | Status: DC | PRN
  Administered 2021-02-03: 140 mg via INTRAVENOUS

## 2021-02-03 MED ORDER — OXYCODONE HCL 5 MG PO TABS: 5.0000 mg | ORAL_TABLET | ORAL | Status: DC | PRN

## 2021-02-03 MED ORDER — CEFAZOLIN SODIUM-DEXTROSE 2-4 GM/100ML-% IV SOLN
2.0000 g | Freq: Three times a day (TID) | INTRAVENOUS | Status: AC
  Administered 2021-02-03 – 2021-02-04 (×2): 2 g via INTRAVENOUS
  Filled 2021-02-03 (×2): qty 100

## 2021-02-03 MED ORDER — CHLORHEXIDINE GLUCONATE 0.12 % MT SOLN
15.0000 mL | Freq: Once | OROMUCOSAL | Status: AC
  Administered 2021-02-03: 15 mL via OROMUCOSAL
  Filled 2021-02-03: qty 15

## 2021-02-03 MED ORDER — CHLORHEXIDINE GLUCONATE 0.12 % MT SOLN: 15.0000 mL | Freq: Once | OROMUCOSAL | Status: DC

## 2021-02-03 MED ORDER — MIDAZOLAM HCL 2 MG/2ML IJ SOLN
INTRAMUSCULAR | Status: AC
  Filled 2021-02-03: qty 2

## 2021-02-03 MED ORDER — ONDANSETRON HCL 4 MG/2ML IJ SOLN
INTRAMUSCULAR | Status: AC
  Filled 2021-02-03: qty 2

## 2021-02-03 MED ORDER — TRAMADOL HCL 50 MG PO TABS: 50.0000 mg | ORAL_TABLET | ORAL | Status: DC | PRN

## 2021-02-03 MED ORDER — SODIUM CHLORIDE 0.9 % IV SOLN: 250.0000 mL | INTRAVENOUS | Status: DC | PRN

## 2021-02-03 MED ORDER — ACETAMINOPHEN 650 MG RE SUPP
650.0000 mg | Freq: Four times a day (QID) | RECTAL | Status: DC | PRN
Start: 2021-02-03 — End: 2021-02-04

## 2021-02-03 MED ORDER — 0.9 % SODIUM CHLORIDE (POUR BTL) OPTIME
TOPICAL | Status: DC | PRN
  Administered 2021-02-03: 1000 mL

## 2021-02-03 MED ORDER — ORAL CARE MOUTH RINSE: 15.0000 mL | Freq: Once | OROMUCOSAL | Status: AC

## 2021-02-03 MED ORDER — SUGAMMADEX SODIUM 200 MG/2ML IV SOLN
INTRAVENOUS | Status: DC | PRN
  Administered 2021-02-03: 200 mg via INTRAVENOUS

## 2021-02-03 MED ORDER — PROPOFOL 500 MG/50ML IV EMUL
INTRAVENOUS | Status: DC | PRN
  Administered 2021-02-03: 25 ug/kg/min via INTRAVENOUS

## 2021-02-03 MED ORDER — NITROGLYCERIN IN D5W 200-5 MCG/ML-% IV SOLN: 0.0000 ug/min | INTRAVENOUS | Status: DC

## 2021-02-03 MED ORDER — MIDAZOLAM HCL 5 MG/5ML IJ SOLN
INTRAMUSCULAR | Status: DC | PRN
  Administered 2021-02-03: 2 mg via INTRAVENOUS

## 2021-02-03 MED ORDER — CHLORHEXIDINE GLUCONATE 4 % EX LIQD: 30.0000 mL | CUTANEOUS | Status: DC

## 2021-02-03 MED ORDER — SODIUM CHLORIDE 0.9 % IV SOLN: INTRAVENOUS | Status: DC

## 2021-02-03 MED ORDER — PROPOFOL 10 MG/ML IV BOLUS
INTRAVENOUS | Status: DC | PRN
  Administered 2021-02-03: 120 mg via INTRAVENOUS

## 2021-02-03 SURGICAL SUPPLY — 45 items
BAG COUNTER SPONGE SURGICOUNT (BAG) ×5 IMPLANT
BAG DECANTER FOR FLEXI CONT (MISCELLANEOUS) ×5 IMPLANT
BAG SNAP BAND KOVER 36X36 (MISCELLANEOUS) ×5 IMPLANT
BLADE CLIPPER SURG (BLADE) ×5 IMPLANT
CABLE ADAPT CONN TEMP 6FT (ADAPTER) ×5 IMPLANT
CATH DIAG EXPO 6F VENT PIG 145 (CATHETERS) ×10 IMPLANT
CATH INFINITI 6F AL2 (CATHETERS) ×5 IMPLANT
CATH S G BIP PACING (CATHETERS) ×5 IMPLANT
CHLORAPREP W/TINT 26 (MISCELLANEOUS) ×5 IMPLANT
CLOSURE MYNX CONTROL 6F/7F (Vascular Products) ×5 IMPLANT
CNTNR URN SCR LID CUP LEK RST (MISCELLANEOUS) ×8 IMPLANT
CONT SPEC 4OZ STRL OR WHT (MISCELLANEOUS) ×2
COVER BACK TABLE 80X110 HD (DRAPES) ×5 IMPLANT
DERMABOND ADVANCED (GAUZE/BANDAGES/DRESSINGS) ×1
DERMABOND ADVANCED .7 DNX12 (GAUZE/BANDAGES/DRESSINGS) ×4 IMPLANT
DEVICE CLOSURE PERCLS PRGLD 6F (VASCULAR PRODUCTS) ×8 IMPLANT
DRSG TEGADERM 4X4.75 (GAUZE/BANDAGES/DRESSINGS) ×10 IMPLANT
ELECT REM PT RETURN 9FT ADLT (ELECTROSURGICAL) ×10
ELECTRODE REM PT RTRN 9FT ADLT (ELECTROSURGICAL) ×8 IMPLANT
GAUZE SPONGE 4X4 12PLY STRL (GAUZE/BANDAGES/DRESSINGS) ×5 IMPLANT
GUIDEWIRE SAFE TJ AMPLATZ EXST (WIRE) ×5 IMPLANT
KIT BASIN OR (CUSTOM PROCEDURE TRAY) ×5 IMPLANT
KIT HEART LEFT (KITS) ×5 IMPLANT
KIT SUCTION CATH 14FR (SUCTIONS) IMPLANT
KIT TURNOVER KIT B (KITS) ×5 IMPLANT
NS IRRIG 1000ML POUR BTL (IV SOLUTION) ×5 IMPLANT
PACK ENDO MINOR (CUSTOM PROCEDURE TRAY) ×5 IMPLANT
PAD ELECT DEFIB RADIOL ZOLL (MISCELLANEOUS) ×5 IMPLANT
PERCLOSE PROGLIDE 6F (VASCULAR PRODUCTS) ×10
POSITIONER HEAD DONUT 9IN (MISCELLANEOUS) ×5 IMPLANT
SET MICROPUNCTURE 5F STIFF (MISCELLANEOUS) ×5 IMPLANT
SHEATH BRITE TIP 7FR 35CM (SHEATH) ×5 IMPLANT
SHEATH PINNACLE 6F 10CM (SHEATH) ×5 IMPLANT
SHEATH PINNACLE 8F 10CM (SHEATH) ×5 IMPLANT
SLEEVE REPOSITIONING LENGTH 30 (MISCELLANEOUS) ×5 IMPLANT
STOPCOCK MORSE 400PSI 3WAY (MISCELLANEOUS) ×10 IMPLANT
SUT SILK  1 MH (SUTURE) ×1
SUT SILK 1 MH (SUTURE) ×4 IMPLANT
SYR 50ML LL SCALE MARK (SYRINGE) ×5 IMPLANT
SYR MEDRAD MARK V 150ML (SYRINGE) ×5 IMPLANT
TOWEL GREEN STERILE (TOWEL DISPOSABLE) ×10 IMPLANT
TRANSDUCER W/STOPCOCK (MISCELLANEOUS) ×10 IMPLANT
VALVE HEART TRANSCATH SZ3 29MM (Valve) ×5 IMPLANT
WIRE EMERALD 3MM-J .035X150CM (WIRE) ×5 IMPLANT
WIRE EMERALD 3MM-J .035X260CM (WIRE) ×5 IMPLANT

## 2021-02-03 NOTE — Discharge Instructions (Signed)
ACTIVITY AND EXERCISE °• Daily activity and exercise are an important part of your recovery. People recover at different rates depending on their general health and type of valve procedure. °• Most people recovering from TAVR feel better relatively quickly  °• No lifting, pushing, pulling more than 10 pounds (examples to avoid: groceries, vacuuming, gardening, golfing): °            - For one week with a procedure through the groin. °            - For six weeks for procedures through the chest wall or neck. °NOTE: You will typically see one of our providers 7-14 days after your procedure to discuss WHEN TO RESUME the above activities.  °  °  °DRIVING °• Do not drive until you are seen for follow up and cleared by a provider. Generally, we ask patient to not drive for 1 week after their procedure. °• If you have been told by your doctor in the past that you may not drive, you must talk with him/her before you begin driving again. °  °DRESSING °• Groin site: you may leave the clear dressing over the site for up to one week or until it falls off. °  °HYGIENE °• If you had a femoral (leg) procedure, you may take a shower when you return home. After the shower, pat the site dry. Do NOT use powder, oils or lotions in your groin area until the site has completely healed. °• If you had a chest procedure, you may shower when you return home unless specifically instructed not to by your discharging practitioner. °            - DO NOT scrub incision; pat dry with a towel. °            - DO NOT apply any lotions, oils, powders to the incision. °            - No tub baths / swimming for at least 2 weeks. °• If you notice any fevers, chills, increased pain, swelling, bleeding or pus, please contact your doctor. °  °ADDITIONAL INFORMATION °• If you are going to have an upcoming dental procedure, please contact our office as you will require antibiotics ahead of time to prevent infection on your heart valve.  ° ° °If you have any  questions or concerns you can call the structural heart phone during normal business hours 8am-4pm. If you have an urgent need after hours or weekends please call [phone] to talk to the on call provider for general cardiology. If you have an emergency that requires immediate attention, please call 911.  ° ° °After TAVR Checklist ° °Check  Test Description  ° Follow up appointment in 1-2 weeks  You will see our structural heart physician assistant, Katie Thompson. Your incision sites will be checked and you will be cleared to drive and resume all normal activities if you are doing well.    ° 1 month echo and follow up  You will have an echo to check on your new heart valve and be seen back in the office by Katie Thompson. Many times the echo is not read by your appointment time, but Katie will call you later that day or the following day to report your results.  ° Follow up with your primary cardiologist You will need to be seen by your primary cardiologist in the following 3-6 months after your 1 month appointment in the valve   clinic. Often times your Plavix or Aspirin will be discontinued during this time, but this is decided on a case by case basis.   ° 1 year echo and follow up You will have another echo to check on your heart valve after 1 year and be seen back in the office by Katie Thompson. This your last structural heart visit.  ° Bacterial endocarditis prophylaxis  You will have to take antibiotics for the rest of your life before all dental procedures (even teeth cleanings) to protect your heart valve. Antibiotics are also required before some surgeries. Please check with your cardiologist before scheduling any surgeries. Also, please make sure to tell us if you have a penicillin allergy as you will require an alternative antibiotic.   ° ° °

## 2021-02-03 NOTE — Transfer of Care (Signed)
Immediate Anesthesia Transfer of Care Note  Patient: Danny Johnson  Procedure(s) Performed: TRANSCATHETER AORTIC VALVE REPLACEMENT, TRANSFEMORAL (Chest) INTRAOPERATIVE TRANSTHORACIC ECHOCARDIOGRAM (Left: Chest) ULTRASOUND GUIDANCE FOR VASCULAR ACCESS (Bilateral: Groin)  Patient Location: Cath Lab  Anesthesia Type:General  Level of Consciousness: awake, alert  and oriented  Airway & Oxygen Therapy: Patient Spontanous Breathing and Patient connected to nasal cannula oxygen  Post-op Assessment: Report given to RN, Post -op Vital signs reviewed and stable and Patient moving all extremities  Post vital signs: Reviewed and stable  Last Vitals:  Vitals Value Taken Time  BP 132/63 02/03/21 1255  Temp 36.4 C 02/03/21 1252  Pulse 66 02/03/21 1257  Resp 10 02/03/21 1257  SpO2 99 % 02/03/21 1257  Vitals shown include unvalidated device data.  Last Pain:  Vitals:   02/03/21 1252  TempSrc: Temporal  PainSc: Asleep         Complications: No notable events documented.

## 2021-02-03 NOTE — Anesthesia Preprocedure Evaluation (Addendum)
Anesthesia Evaluation  Patient identified by MRN, date of birth, ID band Patient awake    Reviewed: Allergy & Precautions, NPO status , Patient's Chart, lab work & pertinent test results  Airway Mallampati: I  TM Distance: >3 FB Neck ROM: Full    Dental  (+) Teeth Intact, Dental Advisory Given   Pulmonary asthma , sleep apnea , Current Smoker and Patient abstained from smoking.,    breath sounds clear to auscultation       Cardiovascular hypertension, + CAD  + dysrhythmias + Valvular Problems/Murmurs AS  Rhythm:Regular Rate:Normal + Systolic murmurs    Neuro/Psych PSYCHIATRIC DISORDERS Anxiety Depression negative neurological ROS     GI/Hepatic Neg liver ROS, hiatal hernia,   Endo/Other  negative endocrine ROS  Renal/GU negative Renal ROS     Musculoskeletal  (+) Arthritis ,   Abdominal Normal abdominal exam  (+)   Peds  Hematology negative hematology ROS (+)   Anesthesia Other Findings   Reproductive/Obstetrics                            Anesthesia Physical Anesthesia Plan  ASA: 4  Anesthesia Plan: General   Post-op Pain Management:    Induction: Intravenous  PONV Risk Score and Plan: 0 and Propofol infusion and Ondansetron  Airway Management Planned: Natural Airway and Simple Face Mask  Additional Equipment: None  Intra-op Plan:   Post-operative Plan:   Informed Consent: I have reviewed the patients History and Physical, chart, labs and discussed the procedure including the risks, benefits and alternatives for the proposed anesthesia with the patient or authorized representative who has indicated his/her understanding and acceptance.       Plan Discussed with: CRNA  Anesthesia Plan Comments:        Anesthesia Quick Evaluation

## 2021-02-03 NOTE — Progress Notes (Signed)
Pt received from cath lab. VSS. CHG complete. Bilateral groins level 0. Pt oriented to room and unit. Call light in reach.  Clyde Canterbury, RN

## 2021-02-03 NOTE — CV Procedure (Signed)
[facility]  TAVR OPERATIVE NOTE   Date of Procedure:  02/03/2021  Preoperative Diagnosis: Severe Aortic Stenosis   Postoperative Diagnosis: Same   Procedure:   Transcatheter Aortic Valve Replacement - Transfemoral Approach  Edwards Sapien 3 THV (size 29 mm, model # [id number], serial # [id number])   Co-Surgeons:  Lauree Chandler, MD and Gaye Pollack, MD   Anesthesiologist:  Smith Robert  Echocardiographer:  Croitoru  Pre-operative Echo Findings: Severe aortic stenosis Normal left ventricular systolic function  Post-operative Echo Findings: No paravalvular leak Normal left ventricular systolic function  BRIEF CLINICAL NOTE AND INDICATIONS FOR SURGERY  70 yo male with history of anxiety, arthritis, asthma, HTN, hyperlipidemia and functionally bicuspid aortic valve with aortic stenosis who is here today for TAVR. He has been followed for his aortic valve disease for several years. He has recently had progressive fatigue and dizziness with mild dyspnea. Echo 12/15/20 with LVEF=55-60%, moderate LVH. The aortic valve appears to be tri-leaflet with thickened and calcified leaflets. Mean gradient 31 mmHg, peak gradient 49 mmHg, AVA 0.59 cm2, dimensionless index 0.19, SVI 20. This is consistent with paradoxical low flow, low gradient severe aortic stenosis. Cardiac cath in 2010 with mild CAD. CT scans with anatomy favorable for transfemoral access for delivery of a 29 md Edwards Sapien 3 valve.   During the course of the patient's preoperative work up they have been evaluated comprehensively by a multidisciplinary team of specialists coordinated through the [facility] in the [facility].  They have been demonstrated to suffer from symptomatic severe aortic stenosis as noted above. The patient has been counseled extensively as to the relative risks and benefits of all options for the treatment of severe aortic stenosis including  long term medical therapy, conventional surgery for aortic valve replacement, and transcatheter aortic valve replacement.  The patient has been independently evaluated by Dr. Cyndia Bent with CT surgery and they are felt to be at high risk for conventional surgical aortic valve replacement. The surgeon indicated the patient would be a poor candidate for conventional surgery. Based upon review of all of the patient's preoperative diagnostic tests they are felt to be candidate for transcatheter aortic valve replacement using the transfemoral approach as an alternative to high risk conventional surgery.    Following the decision to proceed with transcatheter aortic valve replacement, a discussion has been held regarding what types of management strategies would be attempted intraoperatively in the event of life-threatening complications, including whether or not the patient would be considered a candidate for the use of cardiopulmonary bypass and/or conversion to open sternotomy for attempted surgical intervention.  The patient has been advised of a variety of complications that might develop peculiar to this approach including but not limited to risks of death, stroke, paravalvular leak, aortic dissection or other major vascular complications, aortic annulus rupture, device embolization, cardiac rupture or perforation, acute myocardial infarction, arrhythmia, heart block or bradycardia requiring permanent pacemaker placement, congestive heart failure, respiratory failure, renal failure, pneumonia, infection, other late complications related to structural valve deterioration or migration, or other complications that might ultimately cause a temporary or permanent loss of functional independence or other long term morbidity.  The patient provides full informed consent for the procedure as described and all questions were answered preoperatively.    DETAILS OF THE OPERATIVE PROCEDURE  PREPARATION:   The patient is  brought to the operating room on the above mentioned date and central monitoring was established by the anesthesia  team including placement of a radial arterial line. The patient is placed in the supine position on the operating table.  Intravenous antibiotics are administered. General anesthesia is used.    Baseline transthoracic echocardiogram was performed. The patient's chest, abdomen, both groins, and both lower extremities are prepared and draped in a sterile manner. A time out procedure is performed.   PERIPHERAL ACCESS:   Using the modified Seldinger technique, femoral arterial and venous access were obtained with placement of a 6 Fr sheath in the artery and a 7 Fr sheath in the vein on the left side using u/s guidance.  A pigtail diagnostic catheter was passed through the femoral arterial sheath under fluoroscopic guidance into the aortic root.  A temporary transvenous pacemaker catheter was passed through the femoral venous sheath under fluoroscopic guidance into the right ventricle.  The pacemaker was tested to ensure stable lead placement and pacemaker capture. Aortic root angiography was performed in order to determine the optimal angiographic angle for valve deployment.  TRANSFEMORAL ACCESS:  A micropuncture kit was used to gain access to the right femoral artery using u/s guidance. Position confirmed with angiography. Pre-closure with double ProGlide closure devices. The patient was heparinized systemically and ACT verified > 250 seconds.    A 16 Fr transfemoral E-sheath was introduced into the right femoral artery after progressively dilating over an Amplatz superstiff wire. An AL-2 catheter was used to direct a straight-tip exchange length wire across the native aortic valve into the left ventricle. This was exchanged out for a pigtail catheter and position was confirmed in the LV apex. Simultaneous LV and Ao pressures were recorded.  The pigtail catheter was then exchanged for an Amplatz  Extra-stiff wire in the LV apex.   TRANSCATHETER HEART VALVE DEPLOYMENT:  An Edwards Sapien 3 THV (size 29 mm) was prepared and crimped per manufacturer's guidelines, and the proper orientation of the valve is confirmed on the [employer] delivery system. The valve was advanced through the introducer sheath using normal technique until in an appropriate position in the abdominal aorta beyond the sheath tip. The balloon was then retracted and using the fine-tuning wheel was centered on the valve. The valve was then advanced across the aortic arch using appropriate flexion of the catheter. The valve was carefully positioned across the aortic valve annulus. The Commander catheter was retracted using normal technique. Once final position of the valve has been confirmed by angiographic assessment, the valve is deployed while temporarily holding ventilation and during rapid ventricular pacing to maintain systolic blood pressure < 50 mmHg and pulse pressure < 10 mmHg. The balloon inflation is held for >3 seconds after reaching full deployment volume. Once the balloon has fully deflated the balloon is retracted into the ascending aorta and valve function is assessed using TTE. There is felt to be no paravalvular leak and no central aortic insufficiency.  The patient's hemodynamic recovery following valve deployment is good.  The deployment balloon and guidewire are both removed. Echo demostrated acceptable post-procedural gradients, stable mitral valve function, and no AI.   PROCEDURE COMPLETION:  The sheath was then removed and closure devices were completed. Protamine was administered once femoral arterial repair was complete. The temporary pacemaker, pigtail catheters and femoral sheaths were removed with a Mynx closure device placed in the artery and manual pressure used for venous hemostasis.    The patient tolerated the procedure well and is transported to the surgical intensive care in stable condition.  There were no immediate intraoperative complications.  All sponge instrument and needle counts are verified correct at completion of the operation.   No blood products were administered during the operation.  The patient received a total of 60 mL of intravenous contrast during the procedure.  Lauree Chandler MD 02/03/2021 12:40 PM

## 2021-02-03 NOTE — Interval H&P Note (Signed)
History and Physical Interval Note:  02/03/2021 9:28 AM  Danny Johnson  has presented today for surgery, with the diagnosis of Severe aortic stenosis.  The various methods of treatment have been discussed with the patient and family. After consideration of risks, benefits and other options for treatment, the patient has consented to  Procedure(s): TRANSCATHETER AORTIC VALVE REPLACEMENT, TRANSFEMORAL (N/A) TRANSESOPHAGEAL ECHOCARDIOGRAM (TEE) (N/A) as a surgical intervention.  The patient's history has been reviewed, patient examined, no change in status, stable for surgery.  I have reviewed the patient's chart and labs.  Questions were answered to the patient's satisfaction.     Gaye Pollack

## 2021-02-03 NOTE — Progress Notes (Signed)
  [place] VALVE TEAM   Patient doing well s/p TAVR. He is hemodynamically stable. Groin sites stable. ECG with NSR with no high grade block. Arterial line discontinued with plans to transfer to 4E. Plan for early ambulation after bedrest completed and hopeful discharge over the next 24-48 hours.     Kathyrn Drown NP-C Structural Heart Team  Pager: [phone]

## 2021-02-03 NOTE — Anesthesia Procedure Notes (Signed)
Arterial Line Insertion Start/End9/13/2022 9:25 AM, 02/03/2021 9:36 AM Performed by: Betha Loa, CRNA, CRNA  Patient location: Pre-op. Preanesthetic checklist: patient identified, IV checked, site marked, risks and benefits discussed, surgical consent, monitors and equipment checked, pre-op evaluation, timeout performed and anesthesia consent Lidocaine 1% used for infiltration Right, radial was placed Catheter size: 20 G Hand hygiene performed  and maximum sterile barriers used   Attempts: 1 Procedure performed without using ultrasound guided technique. Following insertion, dressing applied and Biopatch. Post procedure assessment: normal  Patient tolerated the procedure well with no immediate complications.

## 2021-02-03 NOTE — Op Note (Signed)
[facility]   MULTIDISCIPLINARY HEART VALVE TEAM   TAVR OPERATIVE NOTE   Date of Procedure:  02/03/2021  Preoperative Diagnosis: Severe Aortic Stenosis   Postoperative Diagnosis: Same   Procedure:   Transcatheter Aortic Valve Replacement - Percutaneous Right Transfemoral Approach  Edwards Sapien 3 THV (size 29 mm, model # 9600TFX, serial # [id number])   Co-Surgeons:  Gaye Pollack, MD and Lauree Chandler, MD   Anesthesiologist:  Suella Broad, MD  Echocardiographer:  Jerilynn Mages. Croitoru, MD  Pre-operative Echo Findings: Severe aortic stenosis Normal left ventricular systolic function  Post-operative Echo Findings: No paravalvular leak Normal left ventricular systolic function   BRIEF CLINICAL NOTE AND INDICATIONS FOR SURGERY  This 70 year old gentleman has stage D3 paradoxical low-flow, low gradient severe aortic stenosis with New York Heart Association class II symptoms of exertional fatigue and shortness of breath consistent with chronic diastolic congestive heart failure.  I personally reviewed his 2D echo, cardiac catheterization, and CTA studies.  His echocardiogram shows a heavily calcified aortic valve with thickening and restricted leaflet mobility.  Visually it looks like a severely stenotic valve.  The mean gradient was 31 mm with a valve area of 0.59 cm, dimensionless index of 0.19, and low stroke-volume index of 20.  Cardiac catheterization shows mild nonobstructive coronary disease.  The mean gradient across aortic valve was measured at 34 mmHg with a peak to peak gradient of 39 mmHg.  I agree that aortic valve replacement is indicated in this patient for relief of his progressive symptoms and to prevent progressive left ventricular deterioration.  He has mild nonobstructive coronary disease on catheterization and given his age I think transcatheter aortic valve replacement is a reasonable option for treating him.  His gated cardiac CTA shows anatomy  suitable for TAVR using a 29 mm SAPIEN 3 valve.  His abdominal and pelvic CTA shows adequate pelvic vascular anatomy to allow transfemoral insertion.   The patient was counseled at length regarding treatment alternatives for management of severe symptomatic aortic stenosis. The risks and benefits of surgical intervention has been discussed in detail. Long-term prognosis with medical therapy was discussed. Alternative approaches such as conventional surgical aortic valve replacement, transcatheter aortic valve replacement, and palliative medical therapy were compared and contrasted at length. This discussion was placed in the context of the patient's own specific clinical presentation and past medical history. All of his questions have been addressed.    Following the decision to proceed with transcatheter aortic valve replacement, a discussion was held regarding what types of management strategies would be attempted intraoperatively in the event of life-threatening complications, including whether or not the patient would be considered a candidate for the use of cardiopulmonary bypass and/or conversion to open sternotomy for attempted surgical intervention.  I think he is a candidate for emergent sternotomy to manage any intraoperative complications.  The patient is aware of the fact that transient use of cardiopulmonary bypass may be necessary. The patient has been advised of a variety of complications that might develop including but not limited to risks of death, stroke, paravalvular leak, aortic dissection or other major vascular complications, aortic annulus rupture, device embolization, cardiac rupture or perforation, mitral regurgitation, acute myocardial infarction, arrhythmia, heart block or bradycardia requiring permanent pacemaker placement, congestive heart failure, respiratory failure, renal failure, pneumonia, infection, other late complications related to structural valve deterioration or  migration, or other complications that might ultimately cause a temporary or permanent loss of functional independence or other long term morbidity. The patient  provides full informed consent for the procedure as described and all questions were answered.    DETAILS OF THE OPERATIVE PROCEDURE  PREPARATION:    The patient was brought to the operating room on the above mentioned date and appropriate monitoring was established by the anesthesia team. The patient was placed in the supine position on the operating table.  Intravenous antibiotics were administered. General endotracheal anesthesia was induced uneventfully. We were planning to do it under conscious sedation but when he was under deep sedation he appeared to be refluxing up into his throat and was not handling the secretions well. Baseline transthoracic echocardiogram was performed. The patient's abdomen and both groins were prepped and draped in a sterile manner. A time out procedure was performed.   PERIPHERAL ACCESS:    Using the modified Seldinger technique, femoral arterial and venous access was obtained with placement of 6 Fr sheaths on the left side.  A pigtail diagnostic catheter was passed through the left  arterial sheath under fluoroscopic guidance into the aortic root.  A temporary transvenous pacemaker catheter was passed through the left femoral venous sheath under fluoroscopic guidance into the right ventricle.  The pacemaker was tested to ensure stable lead placement and pacemaker capture. Aortic root angiography was performed in order to determine the optimal angiographic angle for valve deployment.   TRANSFEMORAL ACCESS:   Percutaneous transfemoral access and sheath placement was performed using ultrasound guidance.  The right common femoral artery was cannulated using a micropuncture needle and appropriate location was verified using hand injection angiogram.  A pair of Abbott Perclose percutaneous closure devices were  placed and a 6 French sheath replaced into the femoral artery.  The patient was heparinized systemically and ACT verified > 250 seconds.    A 16 Fr transfemoral E-sheath was introduced into the right common femoral artery after progressively dilating over an Amplatz superstiff wire. An AL-2 catheter was used to direct a straight-tip exchange length wire across the native aortic valve into the left ventricle. This was exchanged out for a pigtail catheter and position was confirmed in the LV apex. Simultaneous LV and Ao pressures were recorded.  The pigtail catheter was exchanged for an Amplatz Extra-stiff wire in the LV apex.     BALLOON AORTIC VALVULOPLASTY:   Not performed    TRANSCATHETER HEART VALVE DEPLOYMENT:   An Edwards Sapien 3 transcatheter heart valve (size 29 mm) was prepared and crimped per manufacturer's guidelines, and the proper orientation of the valve is confirmed on the [employer] delivery system. The valve was advanced through the introducer sheath using normal technique until in an appropriate position in the abdominal aorta beyond the sheath tip. The balloon was then retracted and using the fine-tuning wheel was centered on the valve. The valve was then advanced across the aortic arch using appropriate flexion of the catheter. The valve was carefully positioned across the aortic valve annulus. The Commander catheter was retracted using normal technique. Once final position of the valve has been confirmed by angiographic assessment, the valve is deployed while temporarily holding ventilation and during rapid ventricular pacing to maintain systolic blood pressure < 50 mmHg and pulse pressure < 10 mmHg. The balloon inflation is held for >3 seconds after reaching full deployment volume. Once the balloon has fully deflated the balloon is retracted into the ascending aorta and valve function is assessed using echocardiography. There is felt to be no paravalvular leak and no central  aortic insufficiency.  The patient's hemodynamic recovery following  valve deployment is good.  The deployment balloon and guidewire are both removed.    PROCEDURE COMPLETION:   The sheath was removed and femoral artery closure performed.  Protamine was administered once femoral arterial repair was complete. The temporary pacemaker, pigtail catheters and femoral sheaths were removed with manual pressure used for hemostasis.  A Mynx femoral closure device was utilized following removal of the diagnostic sheath in the left femoral artery.  The patient tolerated the procedure well and is transported to the cath lab recovery area in stable condition. There were no immediate intraoperative complications. All sponge instrument and needle counts are verified correct at completion of the operation.   No blood products were administered during the operation.  The patient received a total of 60 mL of intravenous contrast during the procedure.   Gaye Pollack, MD 02/03/2021

## 2021-02-03 NOTE — Anesthesia Procedure Notes (Addendum)
Procedure Name: Intubation Date/Time: 02/03/2021 11:38 AM Performed by: Effie Berkshire, MD Pre-anesthesia Checklist: Patient identified, Emergency Drugs available, Suction available and Patient being monitored Patient Re-evaluated:Patient Re-evaluated prior to induction Oxygen Delivery Method: Circle System Utilized Preoxygenation: Pre-oxygenation with 100% oxygen Induction Type: IV induction Laryngoscope Size: Mac and 4 Grade View: Grade II Tube type: Oral Number of attempts: 2 Airway Equipment and Method: Stylet and Oral airway Placement Confirmation: ETT inserted through vocal cords under direct vision, positive ETCO2 and breath sounds checked- equal and bilateral Secured at: 23 cm Tube secured with: Tape Dental Injury: Teeth and Oropharynx as per pre-operative assessment and Injury to lip  Comments: Mask ventilation not attempted to due to risk of aspiration.   1st attempt CRNA, 2nd attempt MDA.

## 2021-02-03 NOTE — Anesthesia Postprocedure Evaluation (Signed)
Anesthesia Post Note  Patient: AKIM STEGER  Procedure(s) Performed: TRANSCATHETER AORTIC VALVE REPLACEMENT, TRANSFEMORAL (Chest) INTRAOPERATIVE TRANSTHORACIC ECHOCARDIOGRAM (Left: Chest) ULTRASOUND GUIDANCE FOR VASCULAR ACCESS (Bilateral: Groin)     Patient location during evaluation: PACU Anesthesia Type: General Level of consciousness: awake and alert Pain management: pain level controlled Vital Signs Assessment: post-procedure vital signs reviewed and stable Respiratory status: spontaneous breathing, nonlabored ventilation, respiratory function stable and patient connected to nasal cannula oxygen Cardiovascular status: blood pressure returned to baseline and stable Postop Assessment: no apparent nausea or vomiting Anesthetic complications: no   No notable events documented.  Last Vitals:  Vitals:   02/03/21 1340 02/03/21 1350  BP: (!) 93/48 (!) 91/59  Pulse: 66 (!) 56  Resp: 17 11  Temp:    SpO2: 94% 98%    Last Pain:  Vitals:   02/03/21 1322  TempSrc: Temporal  PainSc: 0-No pain                 Effie Berkshire

## 2021-02-03 NOTE — Progress Notes (Signed)
  Echocardiogram 2D Echocardiogram has been performed.  Danny Johnson 02/03/2021, 12:22 PM

## 2021-02-03 NOTE — Progress Notes (Signed)
Mobility Specialist Progress Note   02/03/21 1824  Mobility  Activity Ambulated in hall  Level of Assistance Independent after set-up  Assistive Device Front wheel walker  Distance Ambulated (ft) 490 ft  Mobility Ambulated independently in hallway  Mobility Response Tolerated well  Mobility performed by Mobility specialist  $Mobility charge 1 Mobility   Pt received sitting up in bed. Agreeable to mobility session. Asx throughout ambulation and returned to bed with call bell by their side.   Pre Mobility: 98 HR, 119/77 BP, 100% SpO2 Post Mobility: 83 HR, 142/85 BP, 100% SpO2  Holland Falling Mobility Specialist Phone Number [phone]

## 2021-02-03 NOTE — Anesthesia Procedure Notes (Addendum)
Procedure Name: MAC Date/Time: 02/03/2021 10:36 AM Performed by: Leonor Liv, CRNA Pre-anesthesia Checklist: Patient identified, Emergency Drugs available, Suction available, Patient being monitored and Timeout performed Patient Re-evaluated:Patient Re-evaluated prior to induction Oxygen Delivery Method: Simple face mask Placement Confirmation: positive ETCO2 Dental Injury: Teeth and Oropharynx as per pre-operative assessment

## 2021-02-04 ENCOUNTER — Inpatient Hospital Stay (HOSPITAL_COMMUNITY): Payer: Medicare Other

## 2021-02-04 ENCOUNTER — Encounter (HOSPITAL_COMMUNITY): Payer: Self-pay | Admitting: Cardiovascular Disease

## 2021-02-04 DIAGNOSIS — Z952 Presence of prosthetic heart valve: Secondary | ICD-10-CM

## 2021-02-04 DIAGNOSIS — I110 Hypertensive heart disease with heart failure: Secondary | ICD-10-CM | POA: Diagnosis not present

## 2021-02-04 DIAGNOSIS — I350 Nonrheumatic aortic (valve) stenosis: Secondary | ICD-10-CM

## 2021-02-04 DIAGNOSIS — I352 Nonrheumatic aortic (valve) stenosis with insufficiency: Secondary | ICD-10-CM | POA: Diagnosis not present

## 2021-02-04 DIAGNOSIS — Z954 Presence of other heart-valve replacement: Secondary | ICD-10-CM

## 2021-02-04 DIAGNOSIS — I5033 Acute on chronic diastolic (congestive) heart failure: Secondary | ICD-10-CM | POA: Diagnosis not present

## 2021-02-04 DIAGNOSIS — Z006 Encounter for examination for normal comparison and control in clinical research program: Secondary | ICD-10-CM | POA: Diagnosis not present

## 2021-02-04 LAB — BASIC METABOLIC PANEL
Anion gap: 9 (ref 5–15)
BUN: 9 mg/dL (ref 8–23)
CO2: 27 mmol/L (ref 22–32)
Calcium: 8.7 mg/dL — ABNORMAL LOW (ref 8.9–10.3)
Chloride: 100 mmol/L (ref 98–111)
Creatinine, Ser: 0.82 mg/dL (ref 0.61–1.24)
GFR, Estimated: >60 mL/min (ref >60)
Glucose, Bld: 98 mg/dL (ref 70–99)
Potassium: 3.7 mmol/L (ref 3.5–5.1)
Sodium: 136 mmol/L (ref 135–145)

## 2021-02-04 LAB — CBC
HCT: 40.7 % (ref 39.0–52.0)
Hemoglobin: 14.1 g/dL (ref 13.0–17.0)
MCH: 34.5 pg — ABNORMAL HIGH (ref 26.0–34.0)
MCHC: 34.6 g/dL (ref 30.0–36.0)
MCV: 99.5 fL (ref 80.0–100.0)
Platelets: 181 10*3/uL (ref 150–400)
RBC: 4.09 MIL/uL — ABNORMAL LOW (ref 4.22–5.81)
RDW: 12.5 % (ref 11.5–15.5)
WBC: 9.5 10*3/uL (ref 4.0–10.5)
nRBC: 0 % (ref 0.0–0.2)

## 2021-02-04 LAB — ECHOCARDIOGRAM COMPLETE
AR max vel: 1.66 cm2
AV Area VTI: 1.98 cm2
AV Area mean vel: 1.82 cm2
AV Mean grad: 14 mmHg
AV Peak grad: 25.8 mmHg
Ao pk vel: 2.54 m/s
Area-P 1/2: 2.86 cm2
Height: 74 in
S' Lateral: 2.9 cm
Weight: 3580.8 oz

## 2021-02-04 MED ORDER — LISINOPRIL 10 MG PO TABS
10.0000 mg | ORAL_TABLET | Freq: Every day | ORAL | Status: DC
  Administered 2021-02-04: 10 mg via ORAL
  Filled 2021-02-04: qty 1

## 2021-02-04 MED ORDER — CLOPIDOGREL BISULFATE 75 MG PO TABS: 75.0000 mg | ORAL_TABLET | Freq: Every day | ORAL | 1 refills | Status: DC

## 2021-02-04 NOTE — Progress Notes (Signed)
CARDIAC REHAB PHASE I   PRE:  Rate/Rhythm: 85 SR    BP: sitting 131/81    SaO2: 94 RA  MODE:  Ambulation: 470 ft   POST:  Rate/Rhythm: 116 ST with PVCs, brief trig    BP: sitting 140/67     SaO2: 96 RA  Pt c/o sore groins but walked hall very quickly. Sts he can feel difference after TAVR, no SOB noted. HR up appropriately with increased PVCs, pt asx. Discussed exercise, restrictions and CRPII. Voiced understanding, prefers to exercise on his own. [place], ACSM 02/04/2021 8:45 AM

## 2021-02-04 NOTE — Progress Notes (Signed)
Patient given discharge instructions and stated understanding. 

## 2021-02-04 NOTE — Progress Notes (Signed)
Echocardiogram 2D Echocardiogram has been performed.  Oneal Deputy Trevion Hoben RDCS 02/04/2021, 9:48 AM

## 2021-02-04 NOTE — Discharge Summary (Addendum)
[place] [facility]VE TEAM  Discharge Summary    Patient ID: Danny Johnson MRN: [id number]; DOB: [birth year]  Admit date: 02/03/2021 Discharge date: 02/04/2021  Primary Care Provider: Binnie Rail, MD  Primary Cardiologist: Rozann Lesches, MD / Dr. Angelena Form & Dr. Cyndia Bent (TAVR)  Discharge Diagnoses    Principal Problem:   S/P TAVR (transcatheter aortic valve replacement) Active Problems:   Hyperlipidemia   Essential hypertension   Obesity (BMI 30.0-34.9)   Severe aortic stenosis   Allergies Allergies  Allergen Reactions   Dust Mite Extract Shortness Of Breath and Other (See Comments)   Other Shortness Of Breath    pet dander - wheezing    Diagnostic Studies/Procedures    TAVR OPERATIVE NOTE     Date of Procedure:                02/03/2021   Preoperative Diagnosis:      Severe Aortic Stenosis    Postoperative Diagnosis:    Same    Procedure:        Transcatheter Aortic Valve Replacement - Percutaneous Right Transfemoral Approach             Edwards Sapien 3 THV (size 29 mm, model # 9600TFX, serial # [id number])              Co-Surgeons:                        Gaye Pollack, MD and Lauree Chandler, MD     Anesthesiologist:                  Suella Broad, MD   Echocardiographer:              Bertrum Sol, MD   Pre-operative Echo Findings: Severe aortic stenosis Normal left ventricular systolic function   Post-operative Echo Findings: No paravalvular leak Normal left ventricular systolic function  _____________    Echo 02/04/21: completed but pending formal read at the time of discharge   History of Present Illness     Danny Johnson is a 70 y.o. male with a history of anxiety, arthritis, asthma, HTN, HLD and functionally bicuspid aortic valve with paradoxical LFLG aortic stenosis who presented to [facility] on 02/03/21 for planned TAVR.   Patient has a history of aortic stenosis followed by Dr. Domenic Polite.  He had an  echocardiogram in January 2022 showing a mean gradient of 32 mmHg.  He then developed progressive fatigue with exertion, substernal chest pain and decreased exercise tolerance when playing golf.  Repeat echo on 12/15/2020 showed EF 55%, severe aortic stenosis with a mean gradient of 31 mmHg, AVA 0.59 cm, DVI 0.19, SVI 20.  Cardiac catheterization on 01/12/2021 showed mild nonobstructive coronary disease.  The mean gradient at cath was 33.7 with a peak to peak gradient of 39 mmHg.  The patient has been evaluated by the multidisciplinary valve team and felt to have severe, symptomatic aortic stenosis and to be a suitable candidate for TAVR, which was set up for 02/03/21.   Hospital Course     Consultants: none   Severe AS: s/p successful TAVR with a 29 mm Edwards Sapien 3 THV via the TF approach on 02/03/21. Post operative echo pending. Groin sites are stable. ECG/tele with sinus and no high grade heart block. Continued on home Asprin and started on plavix '75mg'$  daily to be continued x 6 months.  HTN: BP well controlled. Resume  home quinapril   HLD: continue statin   Acute on chronic diastolic CHF: as evidenced by an elevated LVEDP during TAVR procedure. He was treated with IV Lasix '40mg'$  x1. Not discharged with a diuretic. Will follow closely. He appears euvolemic today.    _____________  Discharge Vitals Blood pressure 140/67, pulse 91, temperature 98.8 F (37.1 C), temperature source Oral, resp. rate 16, height '6\' 2"'$  (1.88 m), weight 101.5 kg, SpO2 97 %.  Filed Weights   02/03/21 1413 02/04/21 0447  Weight: 104.2 kg 101.5 kg    Labs & Radiologic Studies    CBC Recent Labs    02/03/21 1203 02/04/21 0219  WBC  --  9.5  HGB 12.6* 14.1  HCT 37.0* 40.7  MCV  --  99.5  PLT  --  0000000   Basic Metabolic Panel Recent Labs    02/03/21 1203 02/04/21 0219  NA 137 136  K 4.4 3.7  CL 104 100  CO2  --  27  GLUCOSE 129* 98  BUN 10 9  CREATININE 0.60* 0.82  CALCIUM  --  8.7*   Liver  Function Tests No results for input(s): AST, ALT, ALKPHOS, BILITOT, PROT, ALBUMIN in the last 72 hours. No results for input(s): LIPASE, AMYLASE in the last 72 hours. Cardiac Enzymes No results for input(s): CKTOTAL, CKMB, CKMBINDEX, TROPONINI in the last 72 hours. BNP Invalid input(s): POCBNP D-Dimer No results for input(s): DDIMER in the last 72 hours. Hemoglobin A1C No results for input(s): HGBA1C in the last 72 hours. Fasting Lipid Panel No results for input(s): CHOL, HDL, LDLCALC, TRIG, CHOLHDL, LDLDIRECT in the last 72 hours. Thyroid Function Tests No results for input(s): TSH, T4TOTAL, T3FREE, THYROIDAB in the last 72 hours.  Invalid input(s): FREET3 _____________  DG Chest 2 View  Result Date: 02/02/2021 CLINICAL DATA:  Preoperative evaluation for upcoming TAVR EXAM: CHEST - 2 VIEW COMPARISON:  CT from 01/15/2021 FINDINGS: Cardiac shadow is within normal limits. The lungs are mildly hyperinflated but clear. No sizable effusion is noted. Degenerative changes of the thoracic spine are noted. IMPRESSION: No active cardiopulmonary disease. Electronically Signed   By: Inez Catalina M.D.   On: 02/02/2021 09:33   CARDIAC CATHETERIZATION  Result Date: 01/12/2021   Prox RCA lesion is 20% stenosed.   Mid Cx lesion is 20% stenosed.   Prox LAD to Mid LAD lesion is 20% stenosed. Mild non-obstructive CAD Severe aortic stenosis by echo (paradoxical low flow/low gradient severe AS). Cath mean gradient 33.7 mmHg, peak to peak gradient 39 mmHg. Recommendations: Continue workup for TAVR   CT CORONARY MORPH W/CTA COR W/SCORE W/CA W/CM &/OR WO/CM  Addendum Date: 01/17/2021   ADDENDUM REPORT: 01/17/2021 10:46 CLINICAL DATA:  Pre-op transcatheter aortic valve replacement (TAVR) EXAM: Cardiac TAVR CT TECHNIQUE: The patient was scanned on a Siemens Force [id number] slice scanner. A 120 kV retrospective scan was triggered in the descending thoracic aorta at 111 HU's. Gantry rotation speed was 270 msecs and  collimation was .9 mm. The 3D data set was reconstructed in 5% intervals of the R-R cycle. Systolic and diastolic phases were analyzed on a dedicated work station using MPR, MIP and VRT modes. The patient received 159m OMNIPAQUE IOHEXOL 350 MG/ML SOLN of contrast. FINDINGS: Aortic Valve: Tricuspid aortic valve. Severely reduced cusp separation. Severely thickened, severely calcified aortic valve cusps. AV calcium score: 3230 Virtual Basal Annulus Measurements: Maximum/Minimum Diameter: 30.3 x 27.3 mm Perimeter: 89.3 mm Area:  622 mm2 Prominent LVOT calcifications below RCC, protruding into annulus,  and NCC. Based on these measurements, the annulus would be suitable for a 29 mm Sapien 3 valve. Sinus of Valsalva Measurements: Non-coronary:  30 mm Right - coronary:  28 mm Left - coronary:  31 mm Sinus of Valsalva Height: Left: 18.2 mm Right: 23.5 mm Aorta: Conventional 3 vessel branch pattern of aortic arch. Minimal aortic atherosclerosis. Sinotubular Junction: 29 mm Ascending Thoracic Aorta:  32 mm Aortic Arch:  27 mm Descending Thoracic Aorta:  27 mm Coronary Artery Height above Annulus: Left Main: 11.6 mm Right Coronary: 15.9 mm Coronary Arteries: Normal coronary artery origins, 3 vessel coronary artery calcifications. Optimum Fluoroscopic Angle for Delivery: LAO 9, CAU 10 No LA appendage thrombus. IMPRESSION: 1. Tricuspid aortic valve. Severely reduced cusp separation. Severely thickened, severely calcified aortic valve cusps. 2. AV calcium score: 3230 3. Annulus Area: 622 mm2. The annulus would be suitable for a 29 mm Sapien 3 valve. 4. Prominent LVOT calcifications below RCC, protruding into annulus, and NCC. 5. Sufficient coronary artery heights from annulus. 6. Optimum Fluoroscopic Angle for Delivery: LAO 9, CAU 10 Electronically Signed   By: Cherlynn Kaiser M.D.   On: 01/17/2021 10:46   Result Date: 01/17/2021 EXAM: OVER-READ INTERPRETATION  CT CHEST The following report is an over-read performed by  radiologist Dr. Vinnie Langton of [facility], [place] on 01/15/2021. This over-read does not include interpretation of cardiac or coronary anatomy or pathology. The coronary calcium score/coronary CTA interpretation by the cardiologist is attached. COMPARISON:  None. FINDINGS: Extracardiac findings will be described separately under dictation for contemporaneously obtained CTA chest, abdomen and pelvis. IMPRESSION: Please see separate dictation for contemporaneously obtained CTA chest, abdomen and pelvis dated 01/15/2021 for full description of relevant extracardiac findings. Electronically Signed: By: Vinnie Langton M.D. On: 01/15/2021 10:52   CT ANGIO CHEST AORTA W/CM & OR WO/CM  Result Date: 01/15/2021 CLINICAL DATA:  70 year old male with history of severe aortic stenosis. Preprocedural study prior to potential transcatheter aortic valve replacement (TAVR) procedure. EXAM: CT ANGIOGRAPHY CHEST, ABDOMEN AND PELVIS TECHNIQUE: Multidetector CT imaging through the chest, abdomen and pelvis was performed using the standard protocol during bolus administration of intravenous contrast. Multiplanar reconstructed images and MIPs were obtained and reviewed to evaluate the vascular anatomy. CONTRAST:  135m OMNIPAQUE IOHEXOL 350 MG/ML SOLN COMPARISON:  None. FINDINGS: CTA CHEST FINDINGS Cardiovascular: Heart size is normal. There is no significant pericardial fluid, thickening or pericardial calcification. There is aortic atherosclerosis, as well as atherosclerosis of the great vessels of the mediastinum and the coronary arteries, including calcified atherosclerotic plaque in the left anterior descending, left circumflex and right coronary arteries. Severe thickening and calcification of the aortic valve. Mediastinum/Lymph Nodes: No pathologically enlarged mediastinal or hilar lymph nodes. Small hiatal hernia. No axillary lymphadenopathy. Lungs/Pleura: No suspicious appearing pulmonary nodules or masses are  noted. No acute consolidative airspace disease. No pleural effusions. Musculoskeletal/Soft Tissues: There are no aggressive appearing lytic or blastic lesions noted in the visualized portions of the skeleton. CTA ABDOMEN AND PELVIS FINDINGS Hepatobiliary: Subcentimeter low-attenuation lesion in segment 4A of the liver, too small to characterize, but statistically likely to represent a tiny cyst. No other suspicious hepatic lesions. No intra or extrahepatic biliary ductal dilatation. Gallbladder is normal in appearance. Pancreas: No pancreatic mass. No pancreatic ductal dilatation. No pancreatic or peripancreatic fluid collections or inflammatory changes. Spleen: Unremarkable. Adrenals/Urinary Tract: Exophytic 2.3 cm low-attenuation lesion in the upper pole the left kidney compatible with a simple cyst. Adjacent to this there is a subcentimeter low-attenuation lesion,  too small to characterize, but statistically likely to represent a small cyst. Right kidney and bilateral adrenal glands are normal in appearance. No hydroureteronephrosis. Urinary bladder is unremarkable in appearance. Stomach/Bowel: The appearance of the stomach is normal. No pathologic dilatation of small bowel or colon. A few scattered colonic diverticulae are noted, without surrounding inflammatory changes to suggest an acute diverticulitis at this time. Normal appendix. Vascular/Lymphatic: Vascular findings and measurements pertinent to potential TAVR procedure, as detailed below. Aortic atherosclerosis, without evidence of aneurysm or dissection in the abdominal or pelvic vasculature. No lymphadenopathy noted in the abdomen or pelvis. Reproductive: Prostate gland and seminal vesicles are unremarkable in appearance. Other: No significant volume of ascites.  No pneumoperitoneum. Musculoskeletal: Bilateral pars defects at L5. 6 mm of anterolisthesis of L5 upon S1. There are no aggressive appearing lytic or blastic lesions noted in the visualized  portions of the skeleton. VASCULAR MEASUREMENTS PERTINENT TO TAVR: AORTA: Minimal Aortic Diameter-17 x 17 mm Severity of Aortic Calcification-mild RIGHT PELVIS: Right Common Iliac Artery - Minimal Diameter-10.5 x 9.7 mm Tortuosity-mild-to-moderate Calcification-none Right External Iliac Artery - Minimal Diameter-6.9 x 7.0 mm Tortuosity-moderate to severe Calcification-none Right Common Femoral Artery - Minimal Diameter-9.1 x 8.9 mm Tortuosity-mild Calcification-none LEFT PELVIS: Left Common Iliac Artery - Minimal Diameter-8.4 x 10.2 mm Tortuosity-moderate Calcification-mild Left External Iliac Artery - Minimal Diameter-7.2 x 6.6 mm Tortuosity-mild to moderate Calcification-none Left Common Femoral Artery - Minimal Diameter-8.1 x 8.5 mm Tortuosity-mild Calcification-none Review of the MIP images confirms the above findings. IMPRESSION: 1. Vascular findings and measurements pertinent to potential TAVR procedure, as detailed above. 2. Severe thickening calcification of the aortic valve, compatible with reported clinical history of severe aortic stenosis. 3. Aortic atherosclerosis, in addition to 3 vessel coronary artery disease. Assessment for potential risk factor modification, dietary therapy or pharmacologic therapy may be warranted, if clinically indicated. 4. Colonic diverticulosis without evidence of acute diverticulitis at this time. 5. Additional incidental findings, as above. Electronically Signed   By: Vinnie Langton M.D.   On: 01/15/2021 11:34   VAS US CAROTID  Result Date: 01/15/2021 Carotid Arterial Duplex Study Patient Name:  SR. Koray Lind Guest  Date of Exam:   01/15/2021 Medical Rec #: [id number]          Accession #:    [id number] Date of Birth: November 14, 1950          Patient Gender: M Patient Age:   77 years Exam Location:  [facility] Procedure:      VAS US CAROTID Referring Phys: Lauree Chandler --------------------------------------------------------------------------------  Indications:        Pre-TAVR, severe aortic stenosis. Comparison Study:  No prior studies. Performing Technologist: Darlin Coco RDMS, RVT  Examination Guidelines: A complete evaluation includes B-mode imaging, spectral Doppler, color Doppler, and power Doppler as needed of all accessible portions of each vessel. Bilateral testing is considered an integral part of a complete examination. Limited examinations for reoccurring indications may be performed as noted.  Right Carotid Findings: +----------+--------+--------+--------+---------------------------+--------+           PSV cm/sEDV cm/sStenosisPlaque Description         Comments +----------+--------+--------+--------+---------------------------+--------+ CCA Prox  100     19                                                  +----------+--------+--------+--------+---------------------------+--------+ CCA Distal70  16                                                  +----------+--------+--------+--------+---------------------------+--------+ ICA Prox  71      23      1-39%   hypoechoic and heterogenous         +----------+--------+--------+--------+---------------------------+--------+ ICA Distal99      38                                                  +----------+--------+--------+--------+---------------------------+--------+ ECA       95      15                                                  +----------+--------+--------+--------+---------------------------+--------+ +----------+--------+-------+----------------+-------------------+           PSV cm/sEDV cmsDescribe        Arm Pressure (mmHG) +----------+--------+-------+----------------+-------------------+ [id number]             Multiphasic, WNL                    +----------+--------+-------+----------------+-------------------+ +---------+--------+--+--------+-+---------+ VertebralPSV cm/s33EDV cm/s8Antegrade +---------+--------+--+--------+-+---------+   Left Carotid Findings: +----------+--------+--------+--------+----------------------------+--------+           PSV cm/sEDV cm/sStenosisPlaque Description          Comments +----------+--------+--------+--------+----------------------------+--------+ CCA Prox  104     22                                                   +----------+--------+--------+--------+----------------------------+--------+ CCA Distal84      22                                                   +----------+--------+--------+--------+----------------------------+--------+ ICA Prox  78      29      1-39%   heterogenous and hyperechoic         +----------+--------+--------+--------+----------------------------+--------+ ICA Distal83      32                                                   +----------+--------+--------+--------+----------------------------+--------+ ECA       98      12                                                   +----------+--------+--------+--------+----------------------------+--------+ +----------+--------+--------+----------------+-------------------+           PSV cm/sEDV cm/sDescribe        Arm Pressure (mmHG) +----------+--------+--------+----------------+-------------------+ [id number]  Multiphasic, WNL                    +----------+--------+--------+----------------+-------------------+ +---------+--------+--+--------+--+---------+ VertebralPSV cm/s42EDV cm/s16Antegrade +---------+--------+--+--------+--+---------+   Summary: Right Carotid: Velocities in the right ICA are consistent with a 1-39% stenosis. Left Carotid: Velocities in the left ICA are consistent with a 1-39% stenosis. Vertebrals:  Bilateral vertebral arteries demonstrate antegrade flow. Subclavians: Normal flow hemodynamics were seen in bilateral subclavian              arteries. *See table(s) above for measurements and observations.  Electronically signed by Jamelle Haring on  01/15/2021 at 5:47:50 PM.    Final    ECHOCARDIOGRAM LIMITED  Result Date: 02/03/2021    ECHOCARDIOGRAM LIMITED REPORT   Patient Name:   SR. Curby Lind Guest Date of Exam: 02/03/2021 Medical Rec #:  [id number]         Height:       74.0 in Accession #:    [id number]        Weight:       232.4 lb Date of Birth:  04-Dec-1950         BSA:          2.317 m Patient Age:    55 years          BP:           132/83 mmHg Patient Gender: M                 HR:           89 bpm. Exam Location:  Inpatient Procedure: Limited Echo, Cardiac Doppler and Color Doppler Indications:     Aortic stenosis  History:         Patient has prior history of Echocardiogram examinations, most                  recent 12/15/2020. CAD, Severe aortic stenosis,                  Signs/Symptoms:Shortness of Breath and Fatigue; Risk                  Factors:Hypertension, Dyslipidemia and Sleep Apnea.  Sonographer:     Dustin Flock RDCS Referring Phys:  [id number] Burnell Blanks Diagnosing Phys: Sanda Klein MD                   PREOPERATIVE FINDINGS:                   Normal left ventricular systolic function and wall motion, EF                  65%.                  Trileaflet aortic valve with severe calcific stenosis.                  Aortic valve area 0.63 cm (0.27 cm/m indexed for BSA), mean                  gradient 23 mmHg, Vmax 3.26 m/s, dimensionless index 0.21,                  acceleration time 115 ms. Gradients are likely underestimated                  due to difficult Doppler beam alignment.  Mild aortic insufficiency.                  Trivial mitral insufficiency.                  No pericardial effusion.                   POSTOPERATIVE FINDINGS:                   Normal left ventricular systolic function and wall motion, EF                  65%.                  Well seated 29 mm Edwards S3U TAVR stent valve without                  perivalvular leak.                  Aortic valve area 3.45 cm (1.48 cm2/m2 BSA),  mean gradient 1.5                  mmHg, Vmax 0.84 m/s, dimensionless index 0.89, acceleration                  time 56 ms. Gradients are likely underestimated due to                  difficult Doppler beam alignment.                  No aortic insufficiency.                  Trivial mitral insufficiency.                  No pericardial effusion.  IMPRESSIONS  1. Left ventricular ejection fraction, by estimation, is 55 to 60%. The left ventricle has normal function. The left ventricle has no regional wall motion abnormalities. There is mild concentric left ventricular hypertrophy.  2. Right ventricular systolic function is normal. The right ventricular size is normal.  3. The mitral valve is normal in structure. Trivial mitral valve regurgitation.  4. The aortic valve is tricuspid. There is severe calcifcation of the aortic valve. There is severe thickening of the aortic valve. Aortic valve regurgitation is mild. Severe aortic valve stenosis. Procedure Date: 02/03/2021. Aortic valve area, by VTI measures 3.45 cm. Aortic valve mean gradient measures 1.5 mmHg. Aortic valve Vmax measures 0.84 m/s. FINDINGS  Left Ventricle: Left ventricular ejection fraction, by estimation, is 55 to 60%. The left ventricle has normal function. The left ventricle has no regional wall motion abnormalities. There is mild concentric left ventricular hypertrophy. Right Ventricle: The right ventricular size is normal. No increase in right ventricular wall thickness. Right ventricular systolic function is normal. Mitral Valve: The mitral valve is normal in structure. Trivial mitral valve regurgitation. Tricuspid Valve: The tricuspid valve is not well visualized. Aortic Valve: The aortic valve is tricuspid. There is severe calcifcation of the aortic valve. There is severe thickening of the aortic valve. Aortic valve regurgitation is mild. Severe aortic stenosis is present. Aortic valve mean gradient measures 1.5 mmHg. Aortic valve peak  gradient measures 2.8 mmHg. Aortic valve area, by VTI measures 3.45 cm. There is a 29 mm Edwards Sapien prosthetic, stented (TAVR) valve present in the aortic position. Aorta: The aortic root is normal in size and structure. LEFT VENTRICLE PLAX 2D LVOT  diam:     2.30 cm LV SV:         67 LV SV Index:   29 LVOT Area:     4.15 cm  AORTIC VALVE AV Area (Vmax):    3.89 cm AV Area (Vmean):   3.46 cm AV Area (VTI):     3.45 cm AV Vmax:           83.60 cm/s AV Vmean:          54.950 cm/s AV VTI:            0.194 m AV Peak Grad:      2.8 mmHg AV Mean Grad:      1.5 mmHg LVOT Vmax:         78.30 cm/s LVOT Vmean:        45.700 cm/s LVOT VTI:          0.161 m LVOT/AV VTI ratio: 0.83  SHUNTS Systemic VTI:  0.16 m Systemic Diam: 2.30 cm Sanda Klein MD Electronically signed by Sanda Klein MD Signature Date/Time: 02/03/2021/1:02:53 PM    Final    Structural Heart Procedure  Result Date: 02/03/2021 See surgical note for result.  CT ANGIO ABDOMEN PELVIS  W &/OR WO CONTRAST  Result Date: 01/15/2021 CLINICAL DATA:  70 year old male with history of severe aortic stenosis. Preprocedural study prior to potential transcatheter aortic valve replacement (TAVR) procedure. EXAM: CT ANGIOGRAPHY CHEST, ABDOMEN AND PELVIS TECHNIQUE: Multidetector CT imaging through the chest, abdomen and pelvis was performed using the standard protocol during bolus administration of intravenous contrast. Multiplanar reconstructed images and MIPs were obtained and reviewed to evaluate the vascular anatomy. CONTRAST:  149m OMNIPAQUE IOHEXOL 350 MG/ML SOLN COMPARISON:  None. FINDINGS: CTA CHEST FINDINGS Cardiovascular: Heart size is normal. There is no significant pericardial fluid, thickening or pericardial calcification. There is aortic atherosclerosis, as well as atherosclerosis of the great vessels of the mediastinum and the coronary arteries, including calcified atherosclerotic plaque in the left anterior descending, left circumflex and  right coronary arteries. Severe thickening and calcification of the aortic valve. Mediastinum/Lymph Nodes: No pathologically enlarged mediastinal or hilar lymph nodes. Small hiatal hernia. No axillary lymphadenopathy. Lungs/Pleura: No suspicious appearing pulmonary nodules or masses are noted. No acute consolidative airspace disease. No pleural effusions. Musculoskeletal/Soft Tissues: There are no aggressive appearing lytic or blastic lesions noted in the visualized portions of the skeleton. CTA ABDOMEN AND PELVIS FINDINGS Hepatobiliary: Subcentimeter low-attenuation lesion in segment 4A of the liver, too small to characterize, but statistically likely to represent a tiny cyst. No other suspicious hepatic lesions. No intra or extrahepatic biliary ductal dilatation. Gallbladder is normal in appearance. Pancreas: No pancreatic mass. No pancreatic ductal dilatation. No pancreatic or peripancreatic fluid collections or inflammatory changes. Spleen: Unremarkable. Adrenals/Urinary Tract: Exophytic 2.3 cm low-attenuation lesion in the upper pole the left kidney compatible with a simple cyst. Adjacent to this there is a subcentimeter low-attenuation lesion, too small to characterize, but statistically likely to represent a small cyst. Right kidney and bilateral adrenal glands are normal in appearance. No hydroureteronephrosis. Urinary bladder is unremarkable in appearance. Stomach/Bowel: The appearance of the stomach is normal. No pathologic dilatation of small bowel or colon. A few scattered colonic diverticulae are noted, without surrounding inflammatory changes to suggest an acute diverticulitis at this time. Normal appendix. Vascular/Lymphatic: Vascular findings and measurements pertinent to potential TAVR procedure, as detailed below. Aortic atherosclerosis, without evidence of aneurysm or dissection in the abdominal or pelvic vasculature. No lymphadenopathy noted in the abdomen  or pelvis. Reproductive: Prostate gland  and seminal vesicles are unremarkable in appearance. Other: No significant volume of ascites.  No pneumoperitoneum. Musculoskeletal: Bilateral pars defects at L5. 6 mm of anterolisthesis of L5 upon S1. There are no aggressive appearing lytic or blastic lesions noted in the visualized portions of the skeleton. VASCULAR MEASUREMENTS PERTINENT TO TAVR: AORTA: Minimal Aortic Diameter-17 x 17 mm Severity of Aortic Calcification-mild RIGHT PELVIS: Right Common Iliac Artery - Minimal Diameter-10.5 x 9.7 mm Tortuosity-mild-to-moderate Calcification-none Right External Iliac Artery - Minimal Diameter-6.9 x 7.0 mm Tortuosity-moderate to severe Calcification-none Right Common Femoral Artery - Minimal Diameter-9.1 x 8.9 mm Tortuosity-mild Calcification-none LEFT PELVIS: Left Common Iliac Artery - Minimal Diameter-8.4 x 10.2 mm Tortuosity-moderate Calcification-mild Left External Iliac Artery - Minimal Diameter-7.2 x 6.6 mm Tortuosity-mild to moderate Calcification-none Left Common Femoral Artery - Minimal Diameter-8.1 x 8.5 mm Tortuosity-mild Calcification-none Review of the MIP images confirms the above findings. IMPRESSION: 1. Vascular findings and measurements pertinent to potential TAVR procedure, as detailed above. 2. Severe thickening calcification of the aortic valve, compatible with reported clinical history of severe aortic stenosis. 3. Aortic atherosclerosis, in addition to 3 vessel coronary artery disease. Assessment for potential risk factor modification, dietary therapy or pharmacologic therapy may be warranted, if clinically indicated. 4. Colonic diverticulosis without evidence of acute diverticulitis at this time. 5. Additional incidental findings, as above. Electronically Signed   By: Vinnie Langton M.D.   On: 01/15/2021 11:34   Disposition   Pt is being discharged home today in good condition.  Follow-up Plans & Appointments     Follow-up Information     Eileen Stanford, PA-C. Go on 02/12/2021.    Specialties: Cardiology, Radiology Why: at 4:00pm. Please arrive to your appointment 10 minutes prior Contact information: [place] [id number] [phone]                  Discharge Medications   Allergies as of 02/04/2021       Reactions   Dust Mite Extract Shortness Of Breath, Other (See Comments)   Other Shortness Of Breath   pet dander - wheezing        Medication List     TAKE these medications    albuterol 108 (90 Base) MCG/ACT inhaler Commonly known as: Ventolin HFA INHALE 2 PUFFS BY MOUTH INTO THE LUNGS EVERY 6 HOURS AS NEEDED FOR WHEEZING OR SHORTNESS OF BREATH   aspirin EC 81 MG tablet Take 1 tablet (81 mg total) by mouth every other day.   atorvastatin 10 MG tablet Commonly known as: LIPITOR Take 1 tablet (10 mg total) by mouth daily. What changed: when to take this   clopidogrel 75 MG tablet Commonly known as: PLAVIX Take 1 tablet (75 mg total) by mouth daily with breakfast.   FISH OIL PO Take 1 capsule by mouth in the morning and at bedtime.   multivitamin with minerals Tabs tablet Take 1 tablet by mouth daily.   quinapril 10 MG tablet Commonly known as: Accupril Take 1 tablet (10 mg total) by mouth daily.   sildenafil 20 MG tablet Commonly known as: REVATIO TAKE 3-5 TABLETS BY MOUTH DAILY AS NEEDED What changed:  how much to take how to take this when to take this reasons to take this additional instructions   venlafaxine XR 37.5 MG 24 hr capsule Commonly known as: EFFEXOR-XR TAKE ONE CAPSULE BY MOUTH ONCE DAILY WITH BREAKFAST         Outstanding Labs/Studies  none  Duration of Discharge Encounter   Greater than 30 minutes including physician time.  SignedAngelena Form, PA-C 02/04/2021, 10:22 AM [phone]

## 2021-02-04 NOTE — Progress Notes (Signed)
HEART AND [place]                                     Cardiology Office Note:    Date:  02/12/2021   ID:  Danny Johnson, DOB 12-Apr-1951, MRN [id number]  PCP:  Binnie Rail, MD  [facility] Cardiologist:  Rozann Lesches, MD / Dr. Angelena Form & Dr. Cyndia Bent (TAVR) [facility] Electrophysiologist:  None   Referring MD: Binnie Rail, MD   [facility] s/p TAVR  History of Present Illness:    Danny Johnson is a 70 y.o. male with a hx of anxiety, arthritis, asthma, HTN, HLD and functionally bicuspid aortic valve with paradoxical LFLG aortic stenosis s/p TAVR (02/03/21) who presents to clinic for follow up.   Patient has a history of aortic stenosis followed by Dr. Domenic Polite.  He had an echocardiogram in January 2022 showing a mean gradient of 32 mmHg.  He then developed progressive fatigue with exertion, substernal chest pain and decreased exercise tolerance when playing golf.  Repeat echo on 12/15/2020 showed EF 55%, severe aortic stenosis with a mean gradient of 31 mmHg, AVA 0.59 cm, DVI 0.19, SVI 20.  Cardiac catheterization on 01/12/2021 showed mild nonobstructive coronary disease.  The mean gradient at cath was 33.7 with a peak to peak gradient of 39 mmHg.   He was evaluated by the multidisciplinary valve team and underwent successful TAVR with a 29 mm Edwards Sapien 3 THV via the TF approach on 02/03/21. Post operative echo showed EF 60%, normally functioning TAVR with a mean gradient of 14 mmHg and no PVL. He was discharge on aspirin  Asprin and started on plavix '75mg'$  daily to be continued x 6 months.  Today the patient presents to clinic for follow up. Here with daughter. Wants to get back golfing. No CP or SOB. No LE edema, orthopnea or PND. No dizziness or syncope. No blood in stool or urine. Had one episode of palpitations this am with no recurrence.    Past Medical History:  Diagnosis Date   Anxiety    Arthritis    Asthma    Bicuspid  aortic valve    Coronary atherosclerosis of native coronary artery    Minimal, LVEF 60%   Depression    Essential hypertension, benign    History of hiatal hernia 12/2020   Melanoma ([place]) 1993   Mixed hyperlipidemia    PVC's (premature ventricular contractions)    S/P TAVR (transcatheter aortic valve replacement) 02/03/2021   79m Edwards S3 via the TF approach by Dr. MBuena Irishand Dr. BCyndia Bent  Sleep apnea     Past Surgical History:  Procedure Laterality Date   HERNIA REPAIR  12/2017   INTRAOPERATIVE TRANSTHORACIC ECHOCARDIOGRAM Left 02/03/2021   Procedure: INTRAOPERATIVE TRANSTHORACIC ECHOCARDIOGRAM;  Surgeon: MBurnell Blanks MD;  [place]  Service: Open Heart Surgery;  Laterality: Left;   RIGHT/LEFT HEART CATH AND CORONARY ANGIOGRAPHY N/A 01/12/2021   Procedure: RIGHT/LEFT HEART CATH AND CORONARY ANGIOGRAPHY;  Surgeon: MBurnell Blanks MD;  [facility];  Service: Cardiovascular;  Laterality: N/A;   TONSILLECTOMY     TRANSCATHETER AORTIC VALVE REPLACEMENT, TRANSFEMORAL N/A 02/03/2021   Procedure: TRANSCATHETER AORTIC VALVE REPLACEMENT, TRANSFEMORAL;  Surgeon: MBurnell Blanks MD;  [place]  Service: Open Heart Surgery;  Laterality: N/A;   ULTRASOUND GUIDANCE FOR VASCULAR ACCESS Bilateral  02/03/2021   Procedure: ULTRASOUND GUIDANCE FOR VASCULAR ACCESS;  Surgeon: Burnell Blanks, MD;  Location: [place];  Service: Open Heart Surgery;  Laterality: Bilateral;    Current Medications: Current Meds  Medication Sig   albuterol (VENTOLIN HFA) 108 (90 Base) MCG/ACT inhaler INHALE 2 PUFFS BY MOUTH INTO THE LUNGS EVERY 6 HOURS AS NEEDED FOR WHEEZING OR SHORTNESS OF BREATH   amoxicillin (AMOXIL) 500 MG capsule Take 4 capsules (2,000 mg total) by mouth once for 1 dose. Take (4) capsule 1 hour before dental work   aspirin EC 81 MG tablet Take 1 tablet (81 mg total) by mouth every other day.   atorvastatin (LIPITOR) 10 MG tablet Take 1  tablet (10 mg total) by mouth daily. (Patient taking differently: Take 10 mg by mouth in the morning.)   clopidogrel (PLAVIX) 75 MG tablet Take 1 tablet (75 mg total) by mouth daily with breakfast.   Multiple Vitamin (MULTIVITAMIN WITH MINERALS) TABS tablet Take 1 tablet by mouth daily.   Omega-3 Fatty Acids (FISH OIL PO) Take 1 capsule by mouth in the morning and at bedtime.   quinapril (ACCUPRIL) 10 MG tablet Take 1 tablet (10 mg total) by mouth daily.   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  Tobacco Use   Smoking status: Former    Packs/day: 0.50    Years: 10.00    Pack years: 5.00    Types: Cigarettes    Start date: 07/21/2001   Smokeless tobacco: Never   Tobacco comments:    quit 2019  Vaping Use   Vaping Use: Never used  Substance and Sexual Activity   Alcohol use: Yes    Alcohol/week: 46.0 standard drinks    Types: 4 Shots of liquor, 42 Standard drinks or equivalent per week    Comment: 4-6 drinks daily, beer/wine/scotch   Drug use: No   Sexual activity: Not on file  Other Topics Concern   Not on file  Social History Narrative   Not on file   Social Determinants of Health   Financial Resource Strain: Not on file  Food Insecurity: Not on file  Transportation Needs: Not on file  Physical Activity: Not on file  Stress: Not on file  Social Connections: Not on file     Family History: The patient's family history includes Dementia in his  mother; Heart Problems in his father and mother.  ROS:   Please see the history of present illness.    All other systems reviewed and are negative.  EKGs/Labs/Other Studies Reviewed:    The following studies were reviewed today:  TAVR OPERATIVE NOTE     Date of Procedure:                02/03/2021   Preoperative Diagnosis:      Severe Aortic Stenosis    Postoperative Diagnosis:    Same    Procedure:  Transcatheter Aortic Valve Replacement - Percutaneous Right Transfemoral Approach             Edwards Sapien 3 THV (size 29 mm, model # 9600TFX, serial # [id number])              Co-Surgeons:                        Gaye Pollack, MD and Lauree Chandler, MD     Anesthesiologist:                  Suella Broad, MD   Echocardiographer:              Bertrum Sol, MD   Pre-operative Echo Findings: Severe aortic stenosis Normal left ventricular systolic function   Post-operative Echo Findings: No paravalvular leak Normal left ventricular systolic function   _____________     Echo 02/04/21: IMPRESSIONS   1. Left ventricular ejection fraction, by estimation, is 60 to 65%. The  left ventricle has normal function. The left ventricle has no regional  wall motion abnormalities. Left ventricular diastolic parameters are  consistent with Grade I diastolic  dysfunction (impaired relaxation).   2. Right ventricular systolic function is normal. The right ventricular  size is mildly enlarged. Tricuspid regurgitation signal is inadequate for  assessing PA pressure.   3. Left atrial size was mildly dilated.   4. Right atrial size was mildly dilated.   5. The mitral valve is normal in structure. No evidence of mitral valve  regurgitation.   6. The aortic valve has been repaired/replaced. Aortic valve  regurgitation is not visualized. There is a 29 mm Edwards Ultra, stented  (TAVR) valve present in the aortic position. Procedure Date: 02/03/2021.  Echo findings are consistent  with normal  structure and function of the aortic valve prosthesis. Aortic valve area,  by VTI measures 1.98 cm. Aortic valve mean gradient measures 14.0 mmHg.  Aortic valve Vmax measures 2.54 m/s. Aortic valve acceleration time  measures 63 msec.   EKG:  EKG is ordered today.  The ekg ordered today demonstrates sinus with frequent PVCs HR 87  Recent Labs: 01/30/2021: ALT 26 02/04/2021: BUN 9; Creatinine, Ser 0.82; Hemoglobin 14.1; Platelets 181; Potassium 3.7; Sodium 136  Recent Lipid Panel    Component Value Date/Time   CHOL 133 10/07/2020 0916   TRIG 55.0 10/07/2020 0916   TRIG 89 10/25/2008 0000   HDL 54.80 10/07/2020 0916   CHOLHDL 2 10/07/2020 0916   VLDL 11.0 10/07/2020 0916   LDLCALC 68 10/07/2020 0916   LDLDIRECT 153.1 06/29/2012 0929     Risk Assessment/Calculations:       Physical Exam:    VS:  BP 124/70   Pulse 87   Ht '6\' 2"'$  (1.88 m)   Wt 231 lb 9.6 oz (105.1 kg)   SpO2 95%   BMI 29.74 kg/m     Wt Readings from Last 3 Encounters:  02/12/21 231 lb 9.6 oz (105.1 kg)  02/04/21 223 lb 12.8 oz (101.5 kg)  01/30/21 232 lb 6.4 oz (105.4 kg)     GEN:  Well nourished, well developed in no acute distress HEENT: Normal NECK: No JVD;  LYMPHATICS: No lymphadenopathy CARDIAC:RRR, soft flow murmur. No rubs, gallops RESPIRATORY:  Clear to auscultation without rales, wheezing or rhonchi  ABDOMEN: Soft, non-tender, non-distended MUSCULOSKELETAL:  No edema; No deformity  SKIN: Warm and dry.  Groin sites clear without hematoma or ecchymosis  NEUROLOGIC:  Alert and  oriented x 3 PSYCHIATRIC:  Normal affect   ASSESSMENT:    1. S/P TAVR (transcatheter aortic valve replacement)   2. Essential hypertension   3. Mixed hyperlipidemia   4. Palpitations    PLAN:    In order of problems listed above:  Severe AS s/p TAVR: doing well 1 week out from TAVR. ECG with no HAVB. Groin sites clear. SBE prophylaxis discussed; I have RX'd amoxicillin. Continue on aspirin and  plavix. I will see him back in 1 month for follow up and echo.    HTN:  BP well controlled. No changes made   HLD: continue statin   Palpations: had an episode of palpitations once today. Has frequent PVCs on ECG. Says he has a long history of PVCs. Discussed if he has recurrence of palpations, will place a monitor. He will call us if he continues to have issues   Medication Adjustments/Labs and Tests Ordered: Current medicines are reviewed at length with the patient today.  Concerns regarding medicines are outlined above.  Orders Placed This Encounter  Procedures   EKG 12-Lead    Meds ordered this encounter  Medications   amoxicillin (AMOXIL) 500 MG capsule    Sig: Take 4 capsules (2,000 mg total) by mouth once for 1 dose. Take (4) capsule 1 hour before dental work    Dispense:  4 capsule    Refill:  3     Patient Instructions  Medication Instructions:  The current medical regimen is effective;  continue present plan and medications.  *If you need a refill on your cardiac medications before your next appointment, please call your pharmacy*  Follow-Up: At [facility], you and your health needs are our priority.  As part of our continuing mission to provide you with exceptional heart care, we have created designated Provider Care Teams.  These Care Teams include your primary Cardiologist (physician) and Advanced Practice Providers (APPs -  Physician Assistants and Nurse Practitioners) who all work together to provide you with the care you need, when you need it.  We recommend signing up for the patient portal called "MyChart".  Sign up information is provided on this After Visit Summary.  MyChart is used to connect with patients for Virtual Visits (Telemedicine).  Patients are able to view lab/test results, encounter notes, upcoming appointments, etc.  Non-urgent messages can be sent to your provider as well.   To learn more about what you can do with MyChart, go to  [email or web address].    Your next appointment:   As scheduled.  Thank you for choosing [place]!!     Signed, Angelena Form, PA-C  02/12/2021 5:15 PM    [facility]

## 2021-02-04 NOTE — Plan of Care (Signed)

## 2021-02-04 NOTE — Progress Notes (Signed)
1 Day Post-Op Procedure(s) (LRB): TRANSCATHETER AORTIC VALVE REPLACEMENT, TRANSFEMORAL (N/A) INTRAOPERATIVE TRANSTHORACIC ECHOCARDIOGRAM (Left) ULTRASOUND GUIDANCE FOR VASCULAR ACCESS (Bilateral) Subjective: Says his lower abdomen is sore but otherwise feels well. Ambulated and says he can already feel improvement in his breathing.  Monitor reviewed and he has not had any bradycardia or heart block.   Objective: Vital signs in last 24 hours: Temp:  [97.6 F (36.4 C)-99 F (37.2 C)] 98.7 F (37.1 C) (09/14 0433) Pulse Rate:  [0-195] 84 (09/14 0433) Cardiac Rhythm: Normal sinus rhythm (09/14 0731) Resp:  [0-27] 16 (09/14 0433) BP: (88-153)/(48-90) 139/86 (09/14 0433) SpO2:  [91 %-98 %] 97 % (09/14 0433) Arterial Line BP: (110-123)/(53-60) 114/59 (09/13 1330) Weight:  [101.5 kg-104.2 kg] 101.5 kg (09/14 0447)  Hemodynamic parameters for last 24 hours:    Intake/Output from previous day: 09/13 0701 - 09/14 0700 In: 2486.7 [P.O.:240; I.V.:2146.7; IV Piggyback:100] Out: 1900 [Urine:1900] Intake/Output this shift: No intake/output data recorded.  General appearance: alert and cooperative Neurologic: intact Heart: regular rate and rhythm, S1, S2 normal, no murmur or rub. Lungs: clear to auscultation bilaterally Abdomen: soft, non-tender; bowel sounds normal Extremities: extremities normal, atraumatic, no cyanosis or edema Wound: groin sites look good.  Lab Results: Recent Labs    02/03/21 1203 02/04/21 0219  WBC  --  9.5  HGB 12.6* 14.1  HCT 37.0* 40.7  PLT  --  181   BMET:  Recent Labs    02/03/21 1203 02/04/21 0219  NA 137 136  K 4.4 3.7  CL 104 100  CO2  --  27  GLUCOSE 129* 98  BUN 10 9  CREATININE 0.60* 0.82  CALCIUM  --  8.7*    PT/INR: No results for input(s): LABPROT, INR in the last 72 hours. ABG    Component Value Date/Time   PHART 7.360 02/03/2021 1057   HCO3 27.7 02/03/2021 1057   TCO2 28 02/03/2021 1203   O2SAT 100.0 02/03/2021 1057    CBG (last 3)  No results for input(s): GLUCAP in the last 72 hours.  Assessment/Plan: S/P Procedure(s) (LRB): TRANSCATHETER AORTIC VALVE REPLACEMENT, TRANSFEMORAL (N/A) INTRAOPERATIVE TRANSTHORACIC ECHOCARDIOGRAM (Left) ULTRASOUND GUIDANCE FOR VASCULAR ACCESS (Bilateral)  POD 1 Hemodynamically stable in sinus rhythm. ECG is normal.  Labs ok  Plan 2D echo this am and home later this am.  ASA and Plavix for valve.   LOS: 1 day    Gaye Pollack 02/04/2021

## 2021-02-04 NOTE — Progress Notes (Signed)
Mobility Specialist Progress Note   02/04/21 1010  Mobility  Activity Ambulated in hall  Level of Assistance Independent  Distance Ambulated (ft) 790 ft  Mobility Ambulated independently in hallway  Mobility Response Tolerated well  Mobility performed by Mobility specialist  $Mobility charge 1 Mobility   Pt received in recliner. Asx and agreeable to mobility session. Gait strong and consistent. C/o of no sx towards end. Pt returned to recliner w/ call bell by side and wife in room.   Holland Falling Mobility Specialist Phone Number [phone]

## 2021-02-05 ENCOUNTER — Telehealth: Payer: Self-pay | Admitting: Physician Assistant

## 2021-02-05 MED FILL — Heparin Sodium (Porcine) Inj 1000 Unit/ML: INTRAMUSCULAR | Qty: 30 | Status: AC

## 2021-02-05 MED FILL — Magnesium Sulfate Inj 50%: INTRAMUSCULAR | Qty: 10 | Status: AC

## 2021-02-05 MED FILL — Potassium Chloride Inj 2 mEq/ML: INTRAVENOUS | Qty: 40 | Status: AC

## 2021-02-05 NOTE — Telephone Encounter (Signed)
  [place] VALVE TEAM   Patient contacted regarding discharge from [facility] on 9/14  Patient understands to follow up with provider Nell Range on 9/22 at [place].  Patient understands discharge instructions? yes Patient understands medications and regimen? yes Patient understands to bring all medications to this visit? yes  Angelena Form PA-C  [facility]

## 2021-02-05 NOTE — Telephone Encounter (Signed)
   [facility] CENTER   MULTIDISCIPLINARY HEART VALVE TEAM   Attempted TOC call.   Tommey Barret PA-C  [facility]   

## 2021-02-09 LAB — POCT I-STAT 7, (LYTES, BLD GAS, ICA,H+H)
Acid-Base Excess: 2 mmol/L (ref 0.0–2.0)
Bicarbonate: 28.6 mmol/L — ABNORMAL HIGH (ref 20.0–28.0)
Calcium, Ion: 1.25 mmol/L (ref 1.15–1.40)
HCT: 35 % — ABNORMAL LOW (ref 39.0–52.0)
Hemoglobin: 11.9 g/dL — ABNORMAL LOW (ref 13.0–17.0)
O2 Saturation: 100 %
Potassium: 3.2 mmol/L — ABNORMAL LOW (ref 3.5–5.1)
Sodium: 143 mmol/L (ref 135–145)
TCO2: 30 mmol/L (ref 22–32)
pCO2 arterial: 49.9 mmHg — ABNORMAL HIGH (ref 32.0–48.0)
pH, Arterial: 7.366 (ref 7.350–7.450)
pO2, Arterial: 179 mmHg — ABNORMAL HIGH (ref 83.0–108.0)

## 2021-02-12 ENCOUNTER — Ambulatory Visit: Payer: Medicare Other | Admitting: Physician Assistant

## 2021-02-12 ENCOUNTER — Encounter: Payer: Self-pay | Admitting: Physician Assistant

## 2021-02-12 ENCOUNTER — Other Ambulatory Visit: Payer: Self-pay

## 2021-02-12 VITALS — BP 124/70 | HR 87 | Ht 74.0 in | Wt 231.6 lb

## 2021-02-12 DIAGNOSIS — I10 Essential (primary) hypertension: Secondary | ICD-10-CM | POA: Diagnosis not present

## 2021-02-12 DIAGNOSIS — Z952 Presence of prosthetic heart valve: Secondary | ICD-10-CM

## 2021-02-12 DIAGNOSIS — E782 Mixed hyperlipidemia: Secondary | ICD-10-CM | POA: Diagnosis not present

## 2021-02-12 DIAGNOSIS — R002 Palpitations: Secondary | ICD-10-CM | POA: Diagnosis not present

## 2021-02-12 MED ORDER — AMOXICILLIN 500 MG PO CAPS: 2000.0000 mg | ORAL_CAPSULE | Freq: Once | ORAL | 3 refills | Status: AC

## 2021-02-12 NOTE — Patient Instructions (Signed)
Medication Instructions:  The current medical regimen is effective;  continue present plan and medications.  *If you need a refill on your cardiac medications before your next appointment, please call your pharmacy*  Follow-Up: At [facility], you and your health needs are our priority.  As part of our continuing mission to provide you with exceptional heart care, we have created designated Provider Care Teams.  These Care Teams include your primary Cardiologist (physician) and Advanced Practice Providers (APPs -  Physician Assistants and Nurse Practitioners) who all work together to provide you with the care you need, when you need it.  We recommend signing up for the patient portal called "MyChart".  Sign up information is provided on this After Visit Summary.  MyChart is used to connect with patients for Virtual Visits (Telemedicine).  Patients are able to view lab/test results, encounter notes, upcoming appointments, etc.  Non-urgent messages can be sent to your provider as well.   To learn more about what you can do with MyChart, go to [email or web address].    Your next appointment:   As scheduled.  Thank you for choosing [place]!!

## 2021-02-17 ENCOUNTER — Other Ambulatory Visit: Payer: Self-pay | Admitting: Internal Medicine

## 2021-02-24 ENCOUNTER — Encounter: Payer: Self-pay | Admitting: Internal Medicine

## 2021-02-26 MED ORDER — ALBUTEROL SULFATE HFA 108 (90 BASE) MCG/ACT IN AERS: INHALATION_SPRAY | RESPIRATORY_TRACT | 2 refills | Status: DC

## 2021-03-04 MED ORDER — ADVAIR HFA 230-21 MCG/ACT IN AERO: 2.0000 | INHALATION_SPRAY | Freq: Two times a day (BID) | RESPIRATORY_TRACT | 5 refills | Status: DC

## 2021-03-04 NOTE — Addendum Note (Signed)
Addended by: Binnie Rail on: 03/04/2021 09:11 PM   Modules accepted: Orders

## 2021-03-05 ENCOUNTER — Encounter: Payer: Self-pay | Admitting: Physician Assistant

## 2021-03-05 ENCOUNTER — Other Ambulatory Visit: Payer: Self-pay | Admitting: Physician Assistant

## 2021-03-05 ENCOUNTER — Ambulatory Visit: Payer: Medicare Other | Admitting: Physician Assistant

## 2021-03-05 ENCOUNTER — Other Ambulatory Visit (HOSPITAL_COMMUNITY): Payer: Medicare Other

## 2021-03-05 ENCOUNTER — Ambulatory Visit (HOSPITAL_COMMUNITY): Payer: Medicare Other | Attending: Internal Medicine

## 2021-03-05 ENCOUNTER — Other Ambulatory Visit: Payer: Self-pay

## 2021-03-05 VITALS — BP 150/90 | HR 73 | Ht 74.0 in | Wt 236.2 lb

## 2021-03-05 DIAGNOSIS — Z952 Presence of prosthetic heart valve: Secondary | ICD-10-CM

## 2021-03-05 DIAGNOSIS — R002 Palpitations: Secondary | ICD-10-CM

## 2021-03-05 DIAGNOSIS — E782 Mixed hyperlipidemia: Secondary | ICD-10-CM

## 2021-03-05 DIAGNOSIS — I10 Essential (primary) hypertension: Secondary | ICD-10-CM | POA: Diagnosis not present

## 2021-03-05 LAB — ECHOCARDIOGRAM COMPLETE
AV Mean grad: 6.8 mmHg
AV Peak grad: 11.1 mmHg
Ao pk vel: 1.67 m/s
Area-P 1/2: 4.89 cm2
S' Lateral: 3.8 cm

## 2021-03-05 MED ORDER — ADVAIR HFA 230-21 MCG/ACT IN AERO: 2.0000 | INHALATION_SPRAY | Freq: Two times a day (BID) | RESPIRATORY_TRACT | 5 refills | Status: DC

## 2021-03-05 MED ORDER — QUINAPRIL HCL 20 MG PO TABS: 20.0000 mg | ORAL_TABLET | Freq: Every day | ORAL | 3 refills | Status: DC

## 2021-03-05 MED ORDER — PERFLUTREN LIPID MICROSPHERE
1.0000 mL | INTRAVENOUS | Status: AC | PRN
  Administered 2021-03-05: 3 mL via INTRAVENOUS

## 2021-03-05 NOTE — Patient Instructions (Signed)
Medication Instructions:  Your physician has recommended you make the following change in your medication:  1.) increase quinapril to 20 mg daily 2.) stop Plavix when your bottle runs out after 5 more months  *If you need a refill on your cardiac medications before your next appointment, please call your pharmacy*   Lab Work: Please return to lab [facility]) in 2 weeks for blood work -basic metabolic panel  Testing/Procedures: none   Follow-Up: 6 month follow up with Dr. Domenic Polite (due April 2023)   Other Instructions

## 2021-03-05 NOTE — Progress Notes (Signed)
HEART AND [place]                                     Cardiology Office Note:    Date:  03/06/2021   ID:  Danny Johnson, DOB [birth year], MRN [id number]  PCP:  Danny Rail, MD  [facility] Cardiologist:  Rozann Lesches, MD / Dr. Angelena Form & Dr. Cyndia Bent (TAVR) [facility] Electrophysiologist:  None   Referring MD: Danny Rail, MD   1 month s/p TAVR  History of Present Illness:    Danny Johnson is a 70 y.o. male with a hx of anxiety, arthritis, asthma, HTN, HLD and functionally bicuspid aortic valve with paradoxical LFLG aortic stenosis s/p TAVR (02/03/21) who presents to clinic for follow up.   Patient has a history of aortic stenosis followed by Dr. Domenic Polite.  He had an echocardiogram in January 2022 showing a mean gradient of 32 mmHg.  He then developed progressive fatigue with exertion, substernal chest pain and decreased exercise tolerance when playing golf.  Repeat echo on 12/15/2020 showed EF 55%, severe aortic stenosis with a mean gradient of 31 mmHg, AVA 0.59 cm, DVI 0.19, SVI 20.  Cardiac catheterization on 01/12/2021 showed mild nonobstructive coronary disease.  The mean gradient at cath was 33.7 with a peak to peak gradient of 39 mmHg.   He was evaluated by the multidisciplinary valve team and underwent successful TAVR with a 29 mm Edwards Sapien 3 THV via the TF approach on 02/03/21. Post operative echo showed EF 60%, normally functioning TAVR with a mean gradient of 14 mmHg and no PVL. He was discharge on aspirin  Asprin and started on plavix 75mg  daily to be continued x 6 months.  Today the patient presents to clinic for follow up. Doing great golfing 3x a week. Wants to get back to biking. Can tell a big difference since TAVR. No CP or SOB. No LE edema, orthopnea or PND. No dizziness or syncope. No blood in stool or urine. No palpitations.    Past Medical History:  Diagnosis Date   Anxiety    Arthritis    Asthma     Bicuspid aortic valve    Coronary atherosclerosis of native coronary artery    Minimal, LVEF 60%   Depression    Essential hypertension, benign    History of hiatal hernia 12/2020   Melanoma ([place]) 1993   Mixed hyperlipidemia    PVC's (premature ventricular contractions)    S/P TAVR (transcatheter aortic valve replacement) 02/03/2021   50mm Edwards S3 via the TF approach by Dr. Buena Irish and Dr. Cyndia Bent   Sleep apnea     Past Surgical History:  Procedure Laterality Date   HERNIA REPAIR  12/2017   INTRAOPERATIVE TRANSTHORACIC ECHOCARDIOGRAM Left 02/03/2021   Procedure: INTRAOPERATIVE TRANSTHORACIC ECHOCARDIOGRAM;  Surgeon: Burnell Blanks, MD;  Location: [place];  Service: Open Heart Surgery;  Laterality: Left;   RIGHT/LEFT HEART CATH AND CORONARY ANGIOGRAPHY N/A 01/12/2021   Procedure: RIGHT/LEFT HEART CATH AND CORONARY ANGIOGRAPHY;  Surgeon: Burnell Blanks, MD;  Location: [place] [facility];  Service: Cardiovascular;  Laterality: N/A;   TONSILLECTOMY     TRANSCATHETER AORTIC VALVE REPLACEMENT, TRANSFEMORAL N/A 02/03/2021   Procedure: TRANSCATHETER AORTIC VALVE REPLACEMENT, TRANSFEMORAL;  Surgeon: Burnell Blanks, MD;  Location: [place];  Service: Open Heart Surgery;  Laterality: N/A;   ULTRASOUND GUIDANCE  FOR VASCULAR ACCESS Bilateral 02/03/2021   Procedure: ULTRASOUND GUIDANCE FOR VASCULAR ACCESS;  Surgeon: Burnell Blanks, MD;  Location: [place];  Service: Open Heart Surgery;  Laterality: Bilateral;    Current Medications: Current Meds  Medication Sig   albuterol (VENTOLIN HFA) 108 (90 Base) MCG/ACT inhaler INHALE 2 PUFFS BY MOUTH INTO THE LUNGS EVERY 6 HOURS AS NEEDED FOR WHEEZING OR SHORTNESS OF BREATH   aspirin EC 81 MG tablet Take 1 tablet (81 mg total) by mouth every other day.   atorvastatin (LIPITOR) 10 MG tablet TAKE 1 TABLET BY MOUTH DAILY.   clopidogrel (PLAVIX) 75 MG tablet Take 1 tablet (75 mg total) by mouth daily with breakfast.    fluticasone-salmeterol (ADVAIR HFA) 230-21 MCG/ACT inhaler Inhale 2 puffs into the lungs 2 (two) times daily.   Multiple Vitamin (MULTIVITAMIN WITH MINERALS) TABS tablet Take 1 tablet by mouth daily.   Omega-3 Fatty Acids (FISH OIL PO) Take 1 capsule by mouth in the morning and at bedtime.   quinapril (ACCUPRIL) 20 MG tablet Take 1 tablet (20 mg total) by mouth daily.   sildenafil (REVATIO) 20 MG tablet TAKE 3-5 TABLETS BY MOUTH DAILY AS NEEDED (Patient taking differently: Take 60-100 mg by mouth daily as needed.)   venlafaxine XR (EFFEXOR-XR) 37.5 MG 24 hr capsule TAKE ONE CAPSULE BY MOUTH ONCE DAILY WITH BREAKFAST   [DISCONTINUED] quinapril (ACCUPRIL) 10 MG tablet Take 1 tablet (10 mg total) by mouth daily.     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  Tobacco Use   Smoking status: Former    Packs/day: 0.50    Years: 10.00    Pack years: 5.00    Types: Cigarettes    Start date: 07/21/2001   Smokeless tobacco: Never   Tobacco comments:    quit 2019  Vaping Use   Vaping Use: Never used  Substance and Sexual Activity   Alcohol use: Yes    Alcohol/week: 46.0 standard drinks    Types: 4 Shots of liquor, 42 Standard drinks or equivalent per week    Comment: 4-6 drinks daily, beer/wine/scotch   Drug use: No   Sexual activity: Not on file  Other Topics Concern   Not on file  Social History Narrative   Not on file   Social Determinants of Health   Financial Resource Strain: Not on file  Food Insecurity: Not on file  Transportation Needs: Not on file  Physical Activity: Not on file  Stress: Not on file  Social Connections: Not on file     Family History: The patient's family history includes Dementia  in his mother; Heart Problems in his father and mother.  ROS:   Please see the history of present illness.    All other systems reviewed and are negative.  EKGs/Labs/Other Studies Reviewed:    The following studies were reviewed today:  TAVR OPERATIVE NOTE     Date of Procedure:                02/03/2021   Preoperative Diagnosis:      Severe Aortic Stenosis    Postoperative Diagnosis:    Same    Procedure:  Transcatheter Aortic Valve Replacement - Percutaneous Right Transfemoral Approach             Edwards Sapien 3 THV (size 29 mm, model # 9600TFX, serial # [id number])              Co-Surgeons:                        Gaye Pollack, MD and Lauree Chandler, MD     Anesthesiologist:                  Suella Broad, MD   Echocardiographer:              Bertrum Sol, MD   Pre-operative Echo Findings: Severe aortic stenosis Normal left ventricular systolic function   Post-operative Echo Findings: No paravalvular leak Normal left ventricular systolic function   _____________     Echo 02/04/21: IMPRESSIONS   1. Left ventricular ejection fraction, by estimation, is 60 to 65%. The  left ventricle has normal function. The left ventricle has no regional  wall motion abnormalities. Left ventricular diastolic parameters are  consistent with Grade I diastolic  dysfunction (impaired relaxation).   2. Right ventricular systolic function is normal. The right ventricular  size is mildly enlarged. Tricuspid regurgitation signal is inadequate for  assessing PA pressure.   3. Left atrial size was mildly dilated.   4. Right atrial size was mildly dilated.   5. The mitral valve is normal in structure. No evidence of mitral valve  regurgitation.   6. The aortic valve has been repaired/replaced. Aortic valve  regurgitation is not visualized. There is a 29 mm Edwards Ultra, stented  (TAVR) valve present in the aortic position. Procedure Date: 02/03/2021.  Echo findings are  consistent with normal  structure and function of the aortic valve prosthesis. Aortic valve area,  by VTI measures 1.98 cm. Aortic valve mean gradient measures 14.0 mmHg.  Aortic valve Vmax measures 2.54 m/s. Aortic valve acceleration time  measures 63 msec.   _________________________  Echo 03/05/21 IMPRESSIONS  1. Left ventricular ejection fraction, by estimation, is 55 to 60%. The  left ventricle has normal function. The left ventricle has no regional  wall motion abnormalities. The left ventricular internal cavity size was  mildly dilated. Left ventricular  diastolic parameters were normal.   2. Right ventricular systolic function is normal. The right ventricular  size is normal.   3. The mitral valve is normal in structure. Mild mitral valve  regurgitation.   4. S/p TAVR 29 mm Edwards Ultra. Peak and mean gradients through the  valve are 13 and 9 mm Hg respectively COmpared to previous echo from Sept  2022, mean graidnet is less (previously 14 mm Hg). Aortic valve  regurgitation is not visualized.   EKG:  EKG is NOT  ordered today.    Recent Labs: 01/30/2021: ALT 26 02/04/2021: BUN 9; Creatinine, Ser 0.82; Hemoglobin 14.1; Platelets 181; Potassium 3.7; Sodium 136  Recent Lipid Panel    Component Value Date/Time   CHOL 133 10/07/2020 0916   TRIG 55.0 10/07/2020 0916   TRIG 89 10/25/2008 0000   HDL 54.80 10/07/2020 0916   CHOLHDL 2 10/07/2020 0916   VLDL 11.0 10/07/2020 0916   LDLCALC 68 10/07/2020 0916   LDLDIRECT 153.1 06/29/2012 0929     Risk Assessment/Calculations:       Physical Exam:    VS:  BP (!) 150/90 Comment: 140/90 in right arm  Pulse 73  Ht 6\' 2"  (1.88 m)   Wt 236 lb 3.2 oz (107.1 kg)   SpO2 95%   BMI 30.33 kg/m     Wt Readings from Last 3 Encounters:  03/05/21 236 lb 3.2 oz (107.1 kg)  02/12/21 231 lb 9.6 oz (105.1 kg)  02/04/21 223 lb 12.8 oz (101.5 kg)     GEN:  Well nourished, well developed in no acute distress HEENT: Normal NECK:  No JVD;  LYMPHATICS: No lymphadenopathy CARDIAC:RRR, soft flow murmur. No rubs, gallops RESPIRATORY:  Clear to auscultation without rales, wheezing or rhonchi  ABDOMEN: Soft, non-tender, non-distended MUSCULOSKELETAL:  No edema; No deformity  SKIN: Warm and dry.   NEUROLOGIC:  Alert and oriented x 3 PSYCHIATRIC:  Normal affect   ASSESSMENT:    1. S/P TAVR (transcatheter aortic valve replacement)   2. Essential hypertension   3. Mixed hyperlipidemia   4. Palpitations     PLAN:    In order of problems listed above:  Severe AS s/p TAVR: echo today showed EF 55%, normally functioning TAVR with a mean gradient of 9 mmHg and no PVL. He has NYHA class I symptoms and back to playing golf 3x a week. SBE prophylaxis discussed; he has amoxicillin. Continue on ASA and Plavix. Plavix can be discontinued after 6 months of therapy (07/2021). I will see him back in 1 year with an echo   HTN:  BP elevated today. Plan to increase quinapril from 10mg  to 20mg  daily. BMET in 2 weeks.    HLD: continue statin   Palpations: these have resolved. He has a Investment banker, operational monitor which he checks time to time, which has been normal  Medication Adjustments/Labs and Tests Ordered: Current medicines are reviewed at length with the patient today.  Concerns regarding medicines are outlined above.  Orders Placed This Encounter  Procedures   Basic metabolic panel     Meds ordered this encounter  Medications   quinapril (ACCUPRIL) 20 MG tablet    Sig: Take 1 tablet (20 mg total) by mouth daily.    Dispense:  90 tablet    Refill:  3    Dose increase      Patient Instructions  Medication Instructions:  Your physician has recommended you make the following change in your medication:  1.) increase quinapril to 20 mg daily 2.) stop Plavix when your bottle runs out after 5 more months  *If you need a refill on your cardiac medications before your next appointment, please call your pharmacy*   Lab Work: Please  return to lab [facility]) in 2 weeks for blood work -basic metabolic panel  Testing/Procedures: none   Follow-Up: 6 month follow up with Dr. Domenic Polite (due April 2023)   Other Instructions     Signed, Angelena Form, PA-C  03/06/2021 10:02 AM    [place]

## 2021-03-05 NOTE — Addendum Note (Signed)
Addended by: Binnie Rail on: 03/05/2021 07:35 AM   Modules accepted: Orders

## 2021-03-10 ENCOUNTER — Telehealth: Payer: Self-pay | Admitting: Physician Assistant

## 2021-03-10 NOTE — Telephone Encounter (Signed)
  [facility]   MULTIDISCIPLINARY HEART VALVE TEAM   Patient called in after tripping over a fence yesterday trying to cover his friend's garden. He heard a pop in his ribs but not having significant pain. Did not hit his head or lose consciousness. He was just worried about his plavix. I have asked him to hold this for a week and call me if he has any other issues.   Angelena Form PA-C  [facility]

## 2021-03-16 ENCOUNTER — Encounter: Payer: Self-pay | Admitting: Internal Medicine

## 2021-03-16 NOTE — Progress Notes (Signed)
Subjective:    Patient ID: Danny Johnson, male    DOB: Jul 08, 1950, 70 y.o.   MRN: [id number]  This visit occurred during the SARS-CoV-2 public health emergency.  Safety protocols were in place, including screening questions prior to the visit, additional usage of staff PPE, and extensive cleaning of exam room while observing appropriate contact time as indicated for disinfecting solutions.    HPI The patient is here for an acute visit.   Left side pain, possible broken rib - he went to step over a low fence one week ago taking his friends dog out and he fell.  He thought it was getting better and then over the weekend sneezed and his pain increased.  He has pain in his left lateral ribs.  He feels popping in that area.    He bruised his right upper inner thigh.  No other injuries.  No LOC.   Ibuprofen has not helped much.     Medications and allergies reviewed with patient and updated if appropriate.  Patient Active Problem List   Diagnosis Date Noted   S/P TAVR (transcatheter aortic valve replacement) 02/03/2021   Severe aortic stenosis    Alcoholism ([place]) 10/01/2020   Depression 10/01/2020   Herpes simplex 06/10/2018   Obesity (BMI 30.0-34.9) 09/26/2017   Ventral hernia without obstruction or gangrene 09/26/2017   Right inguinal hernia 09/26/2017   Prediabetes 10/07/2015   ERECTILE DYSFUNCTION, ORGANIC 11/13/2009   Hyperlipidemia 03/18/2008   Essential hypertension 03/18/2008   PVC's (premature ventricular contractions) 03/18/2008    Current Outpatient Medications on File Prior to Visit  Medication Sig Dispense Refill   albuterol (VENTOLIN HFA) 108 (90 Base) MCG/ACT inhaler INHALE 2 PUFFS BY MOUTH INTO THE LUNGS EVERY 6 HOURS AS NEEDED FOR WHEEZING OR SHORTNESS OF BREATH 54 g 2   aspirin EC 81 MG tablet Take 1 tablet (81 mg total) by mouth every other day. 90 tablet 3   atorvastatin (LIPITOR) 10 MG tablet TAKE 1 TABLET BY MOUTH DAILY. 90 tablet 3   clopidogrel  (PLAVIX) 75 MG tablet Take 1 tablet (75 mg total) by mouth daily with breakfast. 90 tablet 1   fluticasone-salmeterol (ADVAIR HFA) 230-21 MCG/ACT inhaler Inhale 2 puffs into the lungs 2 (two) times daily. 1 each 5   Multiple Vitamin (MULTIVITAMIN WITH MINERALS) TABS tablet Take 1 tablet by mouth daily.     Omega-3 Fatty Acids (FISH OIL PO) Take 1 capsule by mouth in the morning and at bedtime.     quinapril (ACCUPRIL) 20 MG tablet Take 1 tablet (20 mg total) by mouth daily. 90 tablet 3   sildenafil (REVATIO) 20 MG tablet TAKE 3-5 TABLETS BY MOUTH DAILY AS NEEDED (Patient taking differently: Take 60-100 mg by mouth daily as needed.) 60 tablet 2   venlafaxine XR (EFFEXOR-XR) 37.5 MG 24 hr capsule TAKE ONE CAPSULE BY MOUTH ONCE DAILY WITH BREAKFAST 90 capsule 0   No current facility-administered medications on file prior to visit.    Past Medical History:  Diagnosis Date   Anxiety    Arthritis    Asthma    Bicuspid aortic valve    Coronary atherosclerosis of native coronary artery    Minimal, LVEF 60%   Depression    Essential hypertension, benign    History of hiatal hernia 12/2020   Melanoma ([place]) 1993   Mixed hyperlipidemia    PVC's (premature ventricular contractions)    S/P TAVR (transcatheter aortic valve replacement) 02/03/2021   58mm Edwards S3  via the TF approach by Dr. Buena Irish and Dr. Cyndia Bent   Sleep apnea     Past Surgical History:  Procedure Laterality Date   HERNIA REPAIR  12/2017   INTRAOPERATIVE TRANSTHORACIC ECHOCARDIOGRAM Left 02/03/2021   Procedure: INTRAOPERATIVE TRANSTHORACIC ECHOCARDIOGRAM;  Surgeon: Burnell Blanks, MD;  Location: [place];  Service: Open Heart Surgery;  Laterality: Left;   RIGHT/LEFT HEART CATH AND CORONARY ANGIOGRAPHY N/A 01/12/2021   Procedure: RIGHT/LEFT HEART CATH AND CORONARY ANGIOGRAPHY;  Surgeon: Burnell Blanks, MD;  Location: [place] [facility];  Service: Cardiovascular;  Laterality: N/A;   TONSILLECTOMY     TRANSCATHETER  AORTIC VALVE REPLACEMENT, TRANSFEMORAL N/A 02/03/2021   Procedure: TRANSCATHETER AORTIC VALVE REPLACEMENT, TRANSFEMORAL;  Surgeon: Burnell Blanks, MD;  Location: [place];  Service: Open Heart Surgery;  Laterality: N/A;   ULTRASOUND GUIDANCE FOR VASCULAR ACCESS Bilateral 02/03/2021   Procedure: ULTRASOUND GUIDANCE FOR VASCULAR ACCESS;  Surgeon: Burnell Blanks, MD;  Location: [place];  Service: Open Heart Surgery;  Laterality: Bilateral;    Social History   Socioeconomic History   Marital status: Divorced    Spouse name: Not on file   Number of children: 3   Years of education: Not on file   Highest education level: Not on file  Occupational History   Occupation: Retired-Safety and Environmental Compliance  Tobacco Use   Smoking status: Former    Packs/day: 0.50    Years: 10.00    Pack years: 5.00    Types: Cigarettes    Start date: 07/21/2001   Smokeless tobacco: Never   Tobacco comments:    quit 2019  Vaping Use   Vaping Use: Never used  Substance and Sexual Activity   Alcohol use: Yes    Alcohol/week: 46.0 standard drinks    Types: 4 Shots of liquor, 42 Standard drinks or equivalent per week    Comment: 4-6 drinks daily, beer/wine/scotch   Drug use: No   Sexual activity: Not on file  Other Topics Concern   Not on file  Social History Narrative   Not on file   Social Determinants of Health   Financial Resource Strain: Not on file  Food Insecurity: Not on file  Transportation Needs: Not on file  Physical Activity: Not on file  Stress: Not on file  Social Connections: Not on file    Family History  Problem Relation Age of Onset   Dementia Mother    Heart Problems Mother    Heart Problems Father     Review of Systems  Constitutional:  Negative for fever.  Respiratory:  Negative for cough, shortness of breath and wheezing.   Cardiovascular:  Positive for chest pain (Left lateral ribs).      Objective:   Vitals:   03/17/21 0917  BP: (!) 150/86   Pulse: 61  Temp: 98.1 F (36.7 C)  SpO2: 97%   BP Readings from Last 3 Encounters:  03/17/21 (!) 150/86  03/05/21 (!) 150/90  02/12/21 124/70   Wt Readings from Last 3 Encounters:  03/17/21 234 lb (106.1 kg)  03/05/21 236 lb 3.2 oz (107.1 kg)  02/12/21 231 lb 9.6 oz (105.1 kg)   Body mass index is 30.04 kg/m.   Physical Exam Constitutional:      General: He is not in acute distress (NAD but uncomfortable).    Appearance: Normal appearance. He is not ill-appearing.  HENT:     Head: Normocephalic and atraumatic.  Cardiovascular:     Rate and Rhythm: Normal rate  and regular rhythm.  Pulmonary:     Effort: Pulmonary effort is normal. No respiratory distress.     Breath sounds: No wheezing or rales.  Musculoskeletal:        General: Tenderness (left lateral ribs - focal area of tenderness) present. No swelling or deformity.  Skin:    General: Skin is warm and dry.  Neurological:     Mental Status: He is alert.           Assessment & Plan:    See Problem List for Assessment and Plan of chronic medical problems.

## 2021-03-17 ENCOUNTER — Ambulatory Visit (INDEPENDENT_AMBULATORY_CARE_PROVIDER_SITE_OTHER): Payer: Medicare Other

## 2021-03-17 ENCOUNTER — Ambulatory Visit (INDEPENDENT_AMBULATORY_CARE_PROVIDER_SITE_OTHER): Payer: Medicare Other | Admitting: Internal Medicine

## 2021-03-17 ENCOUNTER — Other Ambulatory Visit: Payer: Self-pay

## 2021-03-17 VITALS — BP 150/86 | HR 61 | Temp 98.1°F | Ht 74.0 in | Wt 234.0 lb

## 2021-03-17 DIAGNOSIS — S2242XA Multiple fractures of ribs, left side, initial encounter for closed fracture: Secondary | ICD-10-CM | POA: Diagnosis not present

## 2021-03-17 DIAGNOSIS — R0781 Pleurodynia: Secondary | ICD-10-CM | POA: Diagnosis not present

## 2021-03-17 MED ORDER — HYDROCODONE-ACETAMINOPHEN 5-325 MG PO TABS: 1.0000 | ORAL_TABLET | Freq: Four times a day (QID) | ORAL | 0 refills | Status: AC | PRN

## 2021-03-17 NOTE — Patient Instructions (Addendum)
     Rib xray was ordered.     Medications changes include :   vicodin for pain as needed  Your prescription(s) have been submitted to your pharmacy. Please take as directed and contact our office if you believe you are having problem(s) with the medication(s).   Please call if there is no improvement in your symptoms.

## 2021-03-17 NOTE — Assessment & Plan Note (Signed)
Acute Secondary to a fall proximally 1 week ago Tenderness on exam and concern for fractured rib No shortness of breath, cough or fever Will obtain rib/chest x-ray Discussed treatment is pain management-ibuprofen is not helping much Start Vicodin 5-325 1-2 tabs every 6 hours as needed Discussed that pain should improve over the next several days He will call if his pain is not controlled or if he develops any concerning symptoms such as shortness of breath

## 2021-03-19 DIAGNOSIS — I10 Essential (primary) hypertension: Secondary | ICD-10-CM | POA: Diagnosis not present

## 2021-03-19 DIAGNOSIS — Z952 Presence of prosthetic heart valve: Secondary | ICD-10-CM | POA: Diagnosis not present

## 2021-03-19 DIAGNOSIS — R002 Palpitations: Secondary | ICD-10-CM | POA: Diagnosis not present

## 2021-03-19 DIAGNOSIS — E782 Mixed hyperlipidemia: Secondary | ICD-10-CM | POA: Diagnosis not present

## 2021-03-19 LAB — BASIC METABOLIC PANEL
BUN/Creatinine Ratio: 20 (ref 10–24)
BUN: 15 mg/dL (ref 8–27)
CO2: 23 mmol/L (ref 20–29)
Calcium: 9.5 mg/dL (ref 8.6–10.2)
Chloride: 100 mmol/L (ref 96–106)
Creatinine, Ser: 0.76 mg/dL (ref 0.76–1.27)
Glucose: 111 mg/dL — ABNORMAL HIGH (ref 70–99)
Potassium: 4.6 mmol/L (ref 3.5–5.2)
Sodium: 136 mmol/L (ref 134–144)
eGFR: 95 mL/min/{1.73_m2} (ref >59)

## 2021-03-30 ENCOUNTER — Other Ambulatory Visit: Payer: Self-pay | Admitting: Internal Medicine

## 2021-04-08 ENCOUNTER — Other Ambulatory Visit: Payer: Self-pay | Admitting: Internal Medicine

## 2021-05-04 ENCOUNTER — Encounter: Payer: Self-pay | Admitting: Internal Medicine

## 2021-05-06 ENCOUNTER — Encounter: Payer: Self-pay | Admitting: Internal Medicine

## 2021-05-06 ENCOUNTER — Other Ambulatory Visit: Payer: Self-pay

## 2021-05-06 ENCOUNTER — Ambulatory Visit (INDEPENDENT_AMBULATORY_CARE_PROVIDER_SITE_OTHER): Payer: Medicare Other | Admitting: Internal Medicine

## 2021-05-06 DIAGNOSIS — R42 Dizziness and giddiness: Secondary | ICD-10-CM

## 2021-05-06 MED ORDER — MECLIZINE HCL 25 MG PO TABS: 25.0000 mg | ORAL_TABLET | Freq: Three times a day (TID) | ORAL | 0 refills | Status: DC | PRN

## 2021-05-06 NOTE — Patient Instructions (Signed)
Medications changes include :   meclizine 25 mg three times a day as needed  Your prescription(s) have been submitted to your pharmacy. Please take as directed and contact our office if you believe you are having problem(s) with the medication(s).   Vertigo Vertigo is the feeling that you or your surroundings are moving when they are not. This feeling can come and go at any time. Vertigo often goes away on its own. Vertigo can be dangerous if it occurs while you are doing something that could endanger yourself or others, such as driving or operating machinery. Your health care provider will do tests to try to determine the cause of your vertigo. Tests will also help your health care provider decide how best to treat your condition. Follow these instructions at home: Eating and drinking   Dehydration can make vertigo worse. Drink enough fluid to keep your urine pale yellow. Do not drink alcohol. Activity Return to your normal activities as told by your health care provider. Ask your health care provider what activities are safe for you. In the morning, first sit up on the side of the bed. When you feel okay, stand slowly while you hold onto something until you know that your balance is fine. Move slowly. Avoid sudden body or head movements or certain positions, as told by your health care provider. If you have trouble walking or keeping your balance, try using a cane for stability. If you feel dizzy or unstable, sit down right away. Avoid doing any tasks that would cause danger to you or others if vertigo occurs. Avoid bending down if you feel dizzy. Place items in your home so that they are easy for you to reach without bending or leaning over. Do not drive or use machinery if you feel dizzy. General instructions Take over-the-counter and prescription medicines only as told by your health care provider. Keep all follow-up visits. This is important. Contact a health care provider  if: Your medicines do not relieve your vertigo or they make it worse. Your condition gets worse or you develop new symptoms. You have a fever. You develop nausea or vomiting, or if nausea gets worse. Your family or friends notice any behavioral changes. You have numbness or a prickling and tingling sensation in part of your body. Get help right away if you: Are always dizzy or you faint. Develop severe headaches. Develop a stiff neck. Develop sensitivity to light. Have difficulty moving or speaking. Have weakness in your hands, arms, or legs. Have changes in your hearing or vision. These symptoms may represent a serious problem that is an emergency. Do not wait to see if the symptoms will go away. Get medical help right away. Call your local emergency services (911 in the [place] Do not drive yourself to the hospital. Summary Vertigo is the feeling that you or your surroundings are moving when they are not. Your health care provider will do tests to try to determine the cause of your vertigo. Follow instructions for home care. You may be told to avoid certain tasks, positions, or movements. Contact a health care provider if your medicines do not relieve your symptoms, or if you have a fever, nausea, vomiting, or changes in behavior. Get help right away if you have severe headaches or difficulty speaking, or you develop hearing or vision problems. This information is not intended to replace advice given to you by your health care provider. Make sure you discuss any questions you have with your  health care provider. Document Revised: 04/09/2020 Document Reviewed: 04/09/2020 Elsevier Patient Education  2022 [employer].

## 2021-05-06 NOTE — Assessment & Plan Note (Signed)
Acute Symptoms consistent with probable BPPV He has had 1 episode in the past years ago Symptoms only occur with rolling over in bed and in the morning-the rest of the day he has been fine No other concerning symptoms of infection or neurological symptoms to indicate stroke Exam is normal Advised meclizine 25 mg 3 times daily as needed-most likely can take at night only since he is asymptomatic during the day.  Discussed possible drowsiness as a side effect Call if symptoms do not resolve or with any questions

## 2021-05-06 NOTE — Progress Notes (Signed)
Subjective:    Patient ID: Danny Johnson, male    DOB: August 24, 1950, 70 y.o.   MRN: [id number]  This visit occurred during the SARS-CoV-2 public health emergency.  Safety protocols were in place, including screening questions prior to the visit, additional usage of staff PPE, and extensive cleaning of exam room while observing appropriate contact time as indicated for disinfecting solutions.    HPI The patient is here for an acute visit.   Vertigo - he had this about 25-30 years ago.    Saturday morning, 4 days ago, he turned over in bed and he had a spinning sensation.  It happened Sunday morning as well. The next day he took dayquil and nyquil.  The first day the symptoms were the worst.  He stopped taking the DayQuil and NyQuil and this morning he still had some symptoms, which concerned him.  It only occurs when he turns in bed.  Not when sitting up or during the day, even with head movements.  He denies any other symptoms associated with the spinning sensation    Medications and allergies reviewed with patient and updated if appropriate.  Patient Active Problem List   Diagnosis Date Noted   Rib pain on left side 03/17/2021   S/P TAVR (transcatheter aortic valve replacement) 02/03/2021   Severe aortic stenosis    Alcoholism ([place]) 10/01/2020   Depression 10/01/2020   Herpes simplex 06/10/2018   Obesity (BMI 30.0-34.9) 09/26/2017   Ventral hernia without obstruction or gangrene 09/26/2017   Right inguinal hernia 09/26/2017   Prediabetes 10/07/2015   ERECTILE DYSFUNCTION, ORGANIC 11/13/2009   Hyperlipidemia 03/18/2008   Essential hypertension 03/18/2008   PVC's (premature ventricular contractions) 03/18/2008    Current Outpatient Medications on File Prior to Visit  Medication Sig Dispense Refill   albuterol (VENTOLIN HFA) 108 (90 Base) MCG/ACT inhaler INHALE 2 PUFFS BY MOUTH INTO THE LUNGS EVERY 6 HOURS AS NEEDED FOR WHEEZING OR SHORTNESS OF BREATH 54 g 2   aspirin EC 81  MG tablet Take 1 tablet (81 mg total) by mouth every other day. 90 tablet 3   atorvastatin (LIPITOR) 10 MG tablet TAKE 1 TABLET BY MOUTH DAILY. 90 tablet 3   clopidogrel (PLAVIX) 75 MG tablet Take 1 tablet (75 mg total) by mouth daily with breakfast. 90 tablet 1   fluticasone-salmeterol (ADVAIR HFA) 230-21 MCG/ACT inhaler Inhale 2 puffs into the lungs 2 (two) times daily. 1 each 5   Multiple Vitamin (MULTIVITAMIN WITH MINERALS) TABS tablet Take 1 tablet by mouth daily.     Omega-3 Fatty Acids (FISH OIL PO) Take 1 capsule by mouth in the morning and at bedtime.     quinapril (ACCUPRIL) 20 MG tablet Take 1 tablet (20 mg total) by mouth daily. 90 tablet 3   sildenafil (REVATIO) 20 MG tablet TAKE 3 TO 5 TABLETS BY MOUTH DAILY AS NEEDED 60 tablet 2   venlafaxine XR (EFFEXOR-XR) 37.5 MG 24 hr capsule TAKE ONE CAPSULE BY MOUTH DAILY WITH BREAKFAST 90 capsule 0   No current facility-administered medications on file prior to visit.    Past Medical History:  Diagnosis Date   Anxiety    Arthritis    Asthma    Bicuspid aortic valve    Coronary atherosclerosis of native coronary artery    Minimal, LVEF 60%   Depression    Essential hypertension, benign    History of hiatal hernia 12/2020   Melanoma ([place]) 1993   Mixed hyperlipidemia    PVC's (  premature ventricular contractions)    S/P TAVR (transcatheter aortic valve replacement) 02/03/2021   38mm Edwards S3 via the TF approach by Dr. Buena Irish and Dr. Cyndia Bent   Sleep apnea     Past Surgical History:  Procedure Laterality Date   HERNIA REPAIR  12/2017   INTRAOPERATIVE TRANSTHORACIC ECHOCARDIOGRAM Left 02/03/2021   Procedure: INTRAOPERATIVE TRANSTHORACIC ECHOCARDIOGRAM;  Surgeon: Burnell Blanks, MD;  Location: [facility];  Service: Open Heart Surgery;  Laterality: Left;   RIGHT/LEFT HEART CATH AND CORONARY ANGIOGRAPHY N/A 01/12/2021   Procedure: RIGHT/LEFT HEART CATH AND CORONARY ANGIOGRAPHY;  Surgeon: Burnell Blanks, MD;  Location:  [facility];  Service: Cardiovascular;  Laterality: N/A;   TONSILLECTOMY     TRANSCATHETER AORTIC VALVE REPLACEMENT, TRANSFEMORAL N/A 02/03/2021   Procedure: TRANSCATHETER AORTIC VALVE REPLACEMENT, TRANSFEMORAL;  Surgeon: Burnell Blanks, MD;  Location: [facility];  Service: Open Heart Surgery;  Laterality: N/A;   ULTRASOUND GUIDANCE FOR VASCULAR ACCESS Bilateral 02/03/2021   Procedure: ULTRASOUND GUIDANCE FOR VASCULAR ACCESS;  Surgeon: Burnell Blanks, MD;  Location: [place] [facility];  Service: Open Heart Surgery;  Laterality: Bilateral;    Social History   Socioeconomic History   Marital status: Divorced    Spouse name: Not on file   Number of children: 3   Years of education: Not on file   Highest education level: Not on file  Occupational History   Occupation: Retired-[employer]  Tobacco Use   Smoking status: Former    Packs/day: 0.50    Years: 10.00    Pack years: 5.00    Types: Cigarettes    Start date: 07/21/2001   Smokeless tobacco: Never   Tobacco comments:    quit 2019  Vaping Use   Vaping Use: Never used  Substance and Sexual Activity   Alcohol use: Yes    Alcohol/week: 46.0 standard drinks    Types: 4 Shots of liquor, 42 Standard drinks or equivalent per week    Comment: 4-6 drinks daily, beer/wine/scotch   Drug use: No   Sexual activity: Not on file  Other Topics Concern   Not on file  Social History Narrative   Not on file   Social Determinants of Health   Financial Resource Strain: Not on file  Food Insecurity: Not on file  Transportation Needs: Not on file  Physical Activity: Not on file  Stress: Not on file  Social Connections: Not on file    Family History  Problem Relation Age of Onset   Dementia Mother    Heart Problems Mother    Heart Problems Father     Review of Systems  Constitutional:  Negative for fever.  HENT:  Negative for congestion, ear pain and sore throat.        Left ear feels full a lot of  times  Eyes:  Negative for visual disturbance.  Respiratory:  Negative for cough, shortness of breath and wheezing.   Cardiovascular:  Negative for chest pain and palpitations.  Gastrointestinal:  Negative for nausea.  Neurological:  Positive for dizziness. Negative for weakness, numbness and headaches.      Objective:   Vitals:   05/06/21 1436  BP: 140/82  Pulse: 88  Temp: 98.7 F (37.1 C)  SpO2: 97%   BP Readings from Last 3 Encounters:  05/06/21 140/82  03/17/21 (!) 150/86  03/05/21 (!) 150/90   Wt Readings from Last 3 Encounters:  05/06/21 235 lb 6.4 oz (106.8 kg)  03/17/21 234 lb (106.1 kg)  03/05/21 236 lb 3.2 oz (107.1 kg)   Body mass index is 30.22 kg/m.   Physical Exam Constitutional:      General: He is not in acute distress.    Appearance: Normal appearance. He is not ill-appearing.  HENT:     Head: Normocephalic and atraumatic.     Right Ear: Tympanic membrane, ear canal and external ear normal. There is no impacted cerumen.     Left Ear: Tympanic membrane, ear canal and external ear normal. There is no impacted cerumen.     Nose: Nose normal.     Mouth/Throat:     Mouth: Mucous membranes are moist.     Pharynx: No oropharyngeal exudate or posterior oropharyngeal erythema.  Eyes:     Conjunctiva/sclera: Conjunctivae normal.  Neck:     Vascular: No carotid bruit.  Cardiovascular:     Rate and Rhythm: Normal rate and regular rhythm.  Pulmonary:     Effort: Pulmonary effort is normal. No respiratory distress.     Breath sounds: No wheezing or rales.  Musculoskeletal:     Cervical back: Neck supple. No tenderness.     Right lower leg: No edema.     Left lower leg: No edema.  Lymphadenopathy:     Cervical: No cervical adenopathy.  Skin:    General: Skin is warm and dry.  Neurological:     General: No focal deficit present.     Mental Status: He is alert.     Cranial Nerves: No cranial nerve deficit.     Sensory: No sensory deficit.     Motor: No  weakness.           Assessment & Plan:    See Problem List for Assessment and Plan of chronic medical problems.

## 2021-05-26 ENCOUNTER — Encounter: Payer: Self-pay | Admitting: Internal Medicine

## 2021-05-28 DIAGNOSIS — D1801 Hemangioma of skin and subcutaneous tissue: Secondary | ICD-10-CM | POA: Diagnosis not present

## 2021-05-28 DIAGNOSIS — Z85820 Personal history of malignant melanoma of skin: Secondary | ICD-10-CM | POA: Diagnosis not present

## 2021-05-28 DIAGNOSIS — L570 Actinic keratosis: Secondary | ICD-10-CM | POA: Diagnosis not present

## 2021-05-28 DIAGNOSIS — Z808 Family history of malignant neoplasm of other organs or systems: Secondary | ICD-10-CM | POA: Diagnosis not present

## 2021-05-28 NOTE — Telephone Encounter (Signed)
Patient states he is available to come in tomorrow  Please advise  *see below*

## 2021-05-31 NOTE — Progress Notes (Signed)
Subjective:    Patient ID: Danny Johnson, male    DOB: 1951-05-23, 71 y.o.   MRN: [id number]  This visit occurred during the SARS-CoV-2 public health emergency.  Safety protocols were in place, including screening questions prior to the visit, additional usage of staff PPE, and extensive cleaning of exam room while observing appropriate contact time as indicated for disinfecting solutions.    HPI The patient is here for an acute visit.   Vertigo -  he was seen last month for vertigo.  It went away and it was good.  New years day it started again - it was severe.  Since then he has had it getting in and out of bed.  Today he played golf and felt fine.   If he turns in bed or lays back in bed and he gets the spinning sensation. The spinning sensation may last for 5 seconds.     You recently had a wisdom tooth taken out of his left lower jaw-he states it felt like there was a sac of fluid there and was unsure if it looked normal and wanted me to take a look.  Medications and allergies reviewed with patient and updated if appropriate.  Patient Active Problem List   Diagnosis Date Noted   Rib pain on left side 03/17/2021   S/P TAVR (transcatheter aortic valve replacement) 02/03/2021   Severe aortic stenosis    Alcoholism ([place]) 10/01/2020   Depression 10/01/2020   Herpes simplex 06/10/2018   Obesity (BMI 30.0-34.9) 09/26/2017   Ventral hernia without obstruction or gangrene 09/26/2017   Right inguinal hernia 09/26/2017   Prediabetes 10/07/2015   ERECTILE DYSFUNCTION, ORGANIC 11/13/2009   Vertigo 11/05/2008   Hyperlipidemia 03/18/2008   Essential hypertension 03/18/2008   PVC's (premature ventricular contractions) 03/18/2008    Current Outpatient Medications on File Prior to Visit  Medication Sig Dispense Refill   albuterol (VENTOLIN HFA) 108 (90 Base) MCG/ACT inhaler INHALE 2 PUFFS BY MOUTH INTO THE LUNGS EVERY 6 HOURS AS NEEDED FOR WHEEZING OR SHORTNESS OF BREATH 54 g 2    aspirin EC 81 MG tablet Take 1 tablet (81 mg total) by mouth every other day. 90 tablet 3   atorvastatin (LIPITOR) 10 MG tablet TAKE 1 TABLET BY MOUTH DAILY. 90 tablet 3   clopidogrel (PLAVIX) 75 MG tablet Take 1 tablet (75 mg total) by mouth daily with breakfast. 90 tablet 1   fluticasone-salmeterol (ADVAIR HFA) 230-21 MCG/ACT inhaler Inhale 2 puffs into the lungs 2 (two) times daily. 1 each 5   meclizine (ANTIVERT) 25 MG tablet Take 1 tablet (25 mg total) by mouth 3 (three) times daily as needed for dizziness. 30 tablet 0   Multiple Vitamin (MULTIVITAMIN WITH MINERALS) TABS tablet Take 1 tablet by mouth daily.     Omega-3 Fatty Acids (FISH OIL PO) Take 1 capsule by mouth in the morning and at bedtime.     quinapril (ACCUPRIL) 20 MG tablet Take 1 tablet (20 mg total) by mouth daily. 90 tablet 3   sildenafil (REVATIO) 20 MG tablet TAKE 3 TO 5 TABLETS BY MOUTH DAILY AS NEEDED 60 tablet 2   venlafaxine XR (EFFEXOR-XR) 37.5 MG 24 hr capsule TAKE ONE CAPSULE BY MOUTH DAILY WITH BREAKFAST 90 capsule 0   No current facility-administered medications on file prior to visit.    Past Medical History:  Diagnosis Date   Anxiety    Arthritis    Asthma    Bicuspid aortic valve  Coronary atherosclerosis of native coronary artery    Minimal, LVEF 60%   Depression    Essential hypertension, benign    History of hiatal hernia 12/2020   Melanoma ([place]) 1993   Mixed hyperlipidemia    PVC's (premature ventricular contractions)    S/P TAVR (transcatheter aortic valve replacement) 02/03/2021   38mm Edwards S3 via the TF approach by Dr. Buena Irish and Dr. Cyndia Bent   Sleep apnea     Past Surgical History:  Procedure Laterality Date   HERNIA REPAIR  12/2017   INTRAOPERATIVE TRANSTHORACIC ECHOCARDIOGRAM Left 02/03/2021   Procedure: INTRAOPERATIVE TRANSTHORACIC ECHOCARDIOGRAM;  Surgeon: Burnell Blanks, MD;  Location: [facility];  Service: Open Heart Surgery;  Laterality: Left;   RIGHT/LEFT HEART CATH AND  CORONARY ANGIOGRAPHY N/A 01/12/2021   Procedure: RIGHT/LEFT HEART CATH AND CORONARY ANGIOGRAPHY;  Surgeon: Burnell Blanks, MD;  Location: [facility];  Service: Cardiovascular;  Laterality: N/A;   TONSILLECTOMY     TRANSCATHETER AORTIC VALVE REPLACEMENT, TRANSFEMORAL N/A 02/03/2021   Procedure: TRANSCATHETER AORTIC VALVE REPLACEMENT, TRANSFEMORAL;  Surgeon: Burnell Blanks, MD;  Location: [facility];  Service: Open Heart Surgery;  Laterality: N/A;   ULTRASOUND GUIDANCE FOR VASCULAR ACCESS Bilateral 02/03/2021   Procedure: ULTRASOUND GUIDANCE FOR VASCULAR ACCESS;  Surgeon: Burnell Blanks, MD;  Location: [facility];  Service: Open Heart Surgery;  Laterality: Bilateral;    Social History   Socioeconomic History   Marital status: Divorced    Spouse name: Not on file   Number of children: 3   Years of education: Not on file   Highest education level: Not on file  Occupational History   Occupation: Retired-[employer]  Tobacco Use   Smoking status: Former    Packs/day: 0.50    Years: 10.00    Pack years: 5.00    Types: Cigarettes    Start date: 07/21/2001   Smokeless tobacco: Never   Tobacco comments:    quit 2019  Vaping Use   Vaping Use: Never used  Substance and Sexual Activity   Alcohol use: Yes    Alcohol/week: 46.0 standard drinks    Types: 4 Shots of liquor, 42 Standard drinks or equivalent per week    Comment: 4-6 drinks daily, beer/wine/scotch   Drug use: No   Sexual activity: Not on file  Other Topics Concern   Not on file  Social History Narrative   Not on file   Social Determinants of Health   Financial Resource Strain: Not on file  Food Insecurity: Not on file  Transportation Needs: Not on file  Physical Activity: Not on file  Stress: Not on file  Social Connections: Not on file    Family History  Problem Relation Age of Onset   Dementia Mother    Heart Problems Mother    Heart Problems Father     Review of  Systems  Constitutional:  Negative for fever.  Eyes:  Negative for visual disturbance.  Respiratory:  Negative for shortness of breath.   Cardiovascular:  Negative for chest pain and palpitations.  Neurological:  Negative for weakness, light-headedness, numbness and headaches.      Objective:   Vitals:   06/01/21 1432  BP: 130/72  Pulse: 91  Temp: 98.2 F (36.8 C)  SpO2: 97%   BP Readings from Last 3 Encounters:  06/01/21 130/72  05/06/21 140/82  03/17/21 (!) 150/86   Wt Readings from Last 3 Encounters:  06/01/21 239 lb (108.4 kg)  05/06/21 235 lb  6.4 oz (106.8 kg)  03/17/21 234 lb (106.1 kg)   Body mass index is 30.69 kg/m.   Physical Exam Constitutional:      General: He is not in acute distress.    Appearance: Normal appearance. He is not ill-appearing.  HENT:     Head: Normocephalic and atraumatic.     Mouth/Throat:     Comments: Where the tooth was removed there is a hole-stitch does not look like it is intact.  Possible pus.  No bleeding or active discharge Neurological:     Mental Status: He is alert.           Assessment & Plan:    He will contact his oral surgeon to have them look at his mouth   See Problem List for Assessment and Plan of chronic medical problems.

## 2021-06-01 ENCOUNTER — Other Ambulatory Visit: Payer: Self-pay

## 2021-06-01 ENCOUNTER — Encounter: Payer: Self-pay | Admitting: Internal Medicine

## 2021-06-01 ENCOUNTER — Ambulatory Visit (INDEPENDENT_AMBULATORY_CARE_PROVIDER_SITE_OTHER): Payer: Medicare Other | Admitting: Internal Medicine

## 2021-06-01 VITALS — BP 130/72 | HR 91 | Temp 98.2°F | Ht 74.0 in | Wt 239.0 lb

## 2021-06-01 DIAGNOSIS — R42 Dizziness and giddiness: Secondary | ICD-10-CM

## 2021-06-01 NOTE — Assessment & Plan Note (Signed)
Acute on chronic Has had a couple of episodes in the past couple of months of BPPV This most recent episode started January 1 and it did cause him to fall He has had some episodes since then that typically occur with changing positions in bed-he has been able to do other activities without difficulty No concerning neurological symptoms and neurological exam normal last month Has meclizine that he can take as needed Will refer for vestibular rehab

## 2021-06-01 NOTE — Patient Instructions (Addendum)
° ° °  A referral was ordered for  rehab for your dizziness.   Someone from their office will call you to schedule an appointment.

## 2021-06-12 DIAGNOSIS — H8111 Benign paroxysmal vertigo, right ear: Secondary | ICD-10-CM | POA: Diagnosis not present

## 2021-06-12 DIAGNOSIS — R42 Dizziness and giddiness: Secondary | ICD-10-CM | POA: Diagnosis not present

## 2021-06-18 DIAGNOSIS — R42 Dizziness and giddiness: Secondary | ICD-10-CM | POA: Diagnosis not present

## 2021-06-18 DIAGNOSIS — H8111 Benign paroxysmal vertigo, right ear: Secondary | ICD-10-CM | POA: Diagnosis not present

## 2021-06-24 DIAGNOSIS — H8111 Benign paroxysmal vertigo, right ear: Secondary | ICD-10-CM | POA: Diagnosis not present

## 2021-06-30 DIAGNOSIS — H8111 Benign paroxysmal vertigo, right ear: Secondary | ICD-10-CM | POA: Diagnosis not present

## 2021-07-06 DIAGNOSIS — H8111 Benign paroxysmal vertigo, right ear: Secondary | ICD-10-CM | POA: Diagnosis not present

## 2021-07-07 ENCOUNTER — Other Ambulatory Visit: Payer: Self-pay | Admitting: Internal Medicine

## 2021-07-11 ENCOUNTER — Encounter: Payer: Self-pay | Admitting: Internal Medicine

## 2021-07-13 ENCOUNTER — Other Ambulatory Visit: Payer: Self-pay

## 2021-07-13 MED ORDER — VENLAFAXINE HCL ER 37.5 MG PO CP24: 37.5000 mg | ORAL_CAPSULE | Freq: Every day | ORAL | 0 refills | Status: DC

## 2021-07-16 ENCOUNTER — Encounter: Payer: Self-pay | Admitting: Internal Medicine

## 2021-07-20 MED ORDER — LISINOPRIL 20 MG PO TABS: 20.0000 mg | ORAL_TABLET | Freq: Every day | ORAL | 3 refills | Status: DC

## 2021-07-20 MED ORDER — QUINAPRIL HCL 20 MG PO TABS: 20.0000 mg | ORAL_TABLET | Freq: Every day | ORAL | 2 refills | Status: DC

## 2021-07-20 NOTE — Addendum Note (Signed)
Addended by: Aris Georgia, Kamarie Veno L on: 07/20/2021 02:19 PM   Modules accepted: Orders

## 2021-07-20 NOTE — Telephone Encounter (Signed)
Called patient and informed him of changes. Patient verbalized understanding.

## 2021-09-02 ENCOUNTER — Ambulatory Visit (INDEPENDENT_AMBULATORY_CARE_PROVIDER_SITE_OTHER): Payer: Medicare Other

## 2021-09-02 DIAGNOSIS — Z0000 Encounter for general adult medical examination without abnormal findings: Secondary | ICD-10-CM

## 2021-09-02 NOTE — Progress Notes (Signed)
?I connected with Danny Johnson today by telephone and verified that I am speaking with the correct person using two identifiers. ?Location patient: home ?Location provider: work ?Persons participating in the virtual visit: patient, provider. ?  ?I discussed the limitations, risks, security and privacy concerns of performing an evaluation and management service by telephone and the availability of in person appointments. I also discussed with the patient that there may be a patient responsible charge related to this service. The patient expressed understanding and verbally consented to this telephonic visit.  ?  ?Interactive audio and video telecommunications were attempted between this provider and patient, however failed, due to patient having technical difficulties OR patient did not have access to video capability.  We continued and completed visit with audio only. ? ?Some vital signs may be absent or patient reported.  ? ?Time Spent with patient on telephone encounter: 30 minutes ? ?Subjective:  ? Danny Johnson is a 71 y.o. male who presents for Medicare Annual/Subsequent preventive examination. ? ?Review of Systems    ? ?Cardiac Risk Factors include: advanced age (>85mn, >>104women);dyslipidemia;hypertension;male gender;family history of premature cardiovascular disease ? ?   ?Objective:  ?  ?There were no vitals filed for this visit. ?There is no height or weight on file to calculate BMI. ? ? ?  09/02/2021  ?  2:03 PM 02/03/2021  ?  2:34 PM 01/30/2021  ?  3:06 PM 01/28/2021  ? 11:35 AM 01/12/2021  ?  7:49 AM  ?Advanced Directives  ?Does Patient Have a Medical Advance Directive? No No No No No  ?Would patient like information on creating a medical advance directive? No - Patient declined No - Patient declined No - Patient declined Yes (MAU/Ambulatory/Procedural Areas - Information given) Yes (MAU/Ambulatory/Procedural Areas - Information given)  ? ? ?Current Medications (verified) ?Outpatient Encounter Medications as  of 09/02/2021  ?Medication Sig  ? albuterol (VENTOLIN HFA) 108 (90 Base) MCG/ACT inhaler INHALE 2 PUFFS BY MOUTH INTO THE LUNGS EVERY 6 HOURS AS NEEDED FOR WHEEZING OR SHORTNESS OF BREATH  ? aspirin EC 81 MG tablet Take 1 tablet (81 mg total) by mouth every other day.  ? atorvastatin (LIPITOR) 10 MG tablet TAKE 1 TABLET BY MOUTH DAILY.  ? fluticasone-salmeterol (ADVAIR HFA) 230-21 MCG/ACT inhaler Inhale 2 puffs into the lungs 2 (two) times daily.  ? lisinopril (ZESTRIL) 20 MG tablet Take 1 tablet (20 mg total) by mouth daily.  ? Multiple Vitamin (MULTIVITAMIN WITH MINERALS) TABS tablet Take 1 tablet by mouth daily.  ? Omega-3 Fatty Acids (FISH OIL PO) Take 1 capsule by mouth in the morning and at bedtime.  ? sildenafil (REVATIO) 20 MG tablet TAKE 3 TO 5 TABLETS BY MOUTH DAILY AS NEEDED  ? venlafaxine XR (EFFEXOR-XR) 37.5 MG 24 hr capsule Take 1 capsule (37.5 mg total) by mouth daily with breakfast.  ? [DISCONTINUED] clopidogrel (PLAVIX) 75 MG tablet Take 1 tablet (75 mg total) by mouth daily with breakfast.  ? [DISCONTINUED] meclizine (ANTIVERT) 25 MG tablet Take 1 tablet (25 mg total) by mouth 3 (three) times daily as needed for dizziness.  ? ?No facility-administered encounter medications on file as of 09/02/2021.  ? ? ?Allergies (verified) ?Dust mite extract and Other  ? ?History: ?Past Medical History:  ?Diagnosis Date  ? Anxiety   ? Arthritis   ? Asthma   ? Bicuspid aortic valve   ? Coronary atherosclerosis of native coronary artery   ? Minimal, LVEF 60%  ? Depression   ? Essential  hypertension, benign   ? History of hiatal hernia 12/2020  ? Melanoma ([place]) 1993  ? Mixed hyperlipidemia   ? PVC's (premature ventricular contractions)   ? S/P TAVR (transcatheter aortic valve replacement) 02/03/2021  ? 3m Edwards S3 via the TF approach by Dr. MBuena Irishand Dr. BCyndia Bent ? Sleep apnea   ? ?Past Surgical History:  ?Procedure Laterality Date  ? HERNIA REPAIR  12/2017  ? INTRAOPERATIVE TRANSTHORACIC ECHOCARDIOGRAM Left  02/03/2021  ? Procedure: INTRAOPERATIVE TRANSTHORACIC ECHOCARDIOGRAM;  Surgeon: MBurnell Blanks MD;  [facility]  Service: Open Heart Surgery;  Laterality: Left;  ? RIGHT/LEFT HEART CATH AND CORONARY ANGIOGRAPHY N/A 01/12/2021  ? Procedure: RIGHT/LEFT HEART CATH AND CORONARY ANGIOGRAPHY;  Surgeon: MBurnell Blanks MD;  [facility];  Service: Cardiovascular;  Laterality: N/A;  ? TONSILLECTOMY    ? TRANSCATHETER AORTIC VALVE REPLACEMENT, TRANSFEMORAL N/A 02/03/2021  ? Procedure: TRANSCATHETER AORTIC VALVE REPLACEMENT, TRANSFEMORAL;  Surgeon: MBurnell Blanks MD;  [facility]  Service: Open Heart Surgery;  Laterality: N/A;  ? ULTRASOUND GUIDANCE FOR VASCULAR ACCESS Bilateral 02/03/2021  ? Procedure: ULTRASOUND GUIDANCE FOR VASCULAR ACCESS;  Surgeon: MBurnell Blanks MD;  [facility]  Service: Open Heart Surgery;  Laterality: Bilateral;  ? ?Family History  ?Problem Relation Age of Onset  ? Dementia Mother   ? Heart Problems Mother   ? Heart Problems Father   ? ?Social History  ? ?Socioeconomic History  ? Marital status: Divorced  ?  Spouse name: Not on file  ? Number of children: 3  ? Years of education: Not on file  ? Highest education level: Not on file  ?Occupational History  ? 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 Smoking status: Former  ?  Packs/day: 0.50  ?  Years: 10.00  ?  Pack years: 5.00  ?  Types: Cigarettes  ?  Start date: 07/21/2001  ? Smokeless tobacco: Never  ? Tobacco comments:  ?  quit 2019  ?Vaping Use  ? Vaping Use: Never used  ?Substance and Sexual Activity  ? Alcohol use: Yes  ?  Alcohol/week: 46.0 standard drinks  ?  Types: 4 Shots of liquor, 42 Standard drinks or equivalent per week  ?  Comment: 4-6 drinks daily, beer/wine/scotch  ? Drug use: No  ? Sexual activity: Not on file  ?Other Topics Concern  ? Not on file  ?Social History Narrative  ? Not on file  ? ?Social Determinants of Health  ? ?Financial  Resource Strain: Low Risk   ? Difficulty of Paying Living Expenses: Not hard at all  ?Food Insecurity: No Food Insecurity  ? Worried About RCharity fundraiserin the Last Year: Never true  ? Ran Out of Food in the Last Year: Never true  ?Transportation Needs: No Transportation Needs  ? Lack of Transportation (Medical): No  ? Lack of Transportation (Non-Medical): No  ?Physical Activity: Sufficiently Active  ? Days of Exercise per Week: 7 days  ? Minutes of Exercise per Session: 60 min  ?Stress: No Stress Concern Present  ? Feeling of Stress : Not at all  ?Social Connections: Unknown  ? Frequency of Communication with Friends and Family: More than three times a week  ? Frequency of Social Gatherings with Friends and Family: More than three times a week  ? Attends Religious Services: Patient refused  ? Active Member of Clubs or Organizations: Yes  ? Attends CArchivistMeetings: More than 4  times per year  ? Marital Status: Divorced  ? ? ?Tobacco Counseling ?Counseling given: Not Answered ?Tobacco comments: quit 2019 ? ? ?Clinical Intake: ? ?Pre-visit preparation completed: Yes ? ?Pain : No/denies pain ? ?  ? ?Nutritional Risks: None ?Diabetes: No ? ?How often do you need to have someone help you when you read instructions, pamphlets, or other written materials from your doctor or pharmacy?: 1 - Never ?What is the last grade level you completed in school?: B.A. Degree; some Post-Graduate work ? ?Diabetic? no ? ?Interpreter Needed?: No ? ?Information entered by :: Lisette Abu, LPN ? ? ?Activities of Daily Living ? ?  09/02/2021  ?  2:16 PM 02/03/2021  ?  2:34 PM  ?In your present state of health, do you have any difficulty performing the following activities:  ?Hearing? 0 0  ?Vision? 0 0  ?Difficulty concentrating or making decisions? 0 0  ?Walking or climbing stairs? 0 0  ?Dressing or bathing? 0 0  ?Doing errands, shopping? 0 0  ?Preparing Food and eating ? N   ?Using the Toilet? N   ?In the past six  months, have you accidently leaked urine? N   ?Do you have problems with loss of bowel control? N   ?Managing your Medications? N   ?Managing your Finances? N   ?Housekeeping or managing your Housekeeping? N

## 2021-09-02 NOTE — Patient Instructions (Signed)
Danny Johnson , ?Thank you for taking time to come for your Medicare Wellness Visit. I appreciate your ongoing commitment to your health goals. Please review the following plan we discussed and let me know if I can assist you in the future.  ? ?Screening recommendations/referrals: ?Colonoscopy: 07/21/2016; due every 5 years (scheduled for 09/22/2021) ?Recommended yearly ophthalmology/optometry visit for glaucoma screening and checkup ?Recommended yearly dental visit for hygiene and checkup ? ?Vaccinations: ?Influenza vaccine: 01/2021 ?Pneumococcal vaccine: 08/26/2014, 10/03/2015 ?Tdap vaccine: 07/24/2019; due every 10 years ?Shingles vaccine: never done ?Zoster vaccine: 08/21/2013   ?Covid-19: 06/13/2019, 07/04/2019, 03/07/2020 ? ?Advanced directives: No ? ?Conditions/risks identified: Yes ? ?Next appointment: Please schedule your next Medicare Wellness Visit with your Nurse Health Advisor in 1 year by calling [phone]. ? ?Preventive Care 2 Years and Older, Male ?Preventive care refers to lifestyle choices and visits with your health care provider that can promote health and wellness. ?What does preventive care include? ?A yearly physical exam. This is also called an annual well check. ?Dental exams once or twice a year. ?Routine eye exams. Ask your health care provider how often you should have your eyes checked. ?Personal lifestyle choices, including: ?Daily care of your teeth and gums. ?Regular physical activity. ?Eating a healthy diet. ?Avoiding tobacco and drug use. ?Limiting alcohol use. ?Practicing safe sex. ?Taking low doses of aspirin every day. ?Taking vitamin and mineral supplements as recommended by your health care provider. ?What happens during an annual well check? ?The services and screenings done by your health care provider during your annual well check will depend on your age, overall health, lifestyle risk factors, and family history of disease. ?Counseling  ?Your health care provider may ask you questions  about your: ?Alcohol use. ?Tobacco use. ?Drug use. ?Emotional well-being. ?Home and relationship well-being. ?Sexual activity. ?Eating habits. ?History of falls. ?Memory and ability to understand (cognition). ?Work and work Statistician. ?Screening  ?You may have the following tests or measurements: ?Height, weight, and BMI. ?Blood pressure. ?Lipid and cholesterol levels. These may be checked every 5 years, or more frequently if you are over 66 years old. ?Skin check. ?Lung cancer screening. You may have this screening every year starting at age 8 if you have a 30-pack-year history of smoking and currently smoke or have quit within the past 15 years. ?Fecal occult blood test (FOBT) of the stool. You may have this test every year starting at age 8. ?Flexible sigmoidoscopy or colonoscopy. You may have a sigmoidoscopy every 5 years or a colonoscopy every 10 years starting at age 43. ?Prostate cancer screening. Recommendations will vary depending on your family history and other risks. ?Hepatitis C blood test. ?Hepatitis B blood test. ?Sexually transmitted disease (STD) testing. ?Diabetes screening. This is done by checking your blood sugar (glucose) after you have not eaten for a while (fasting). You may have this done every 1-3 years. ?Abdominal aortic aneurysm (AAA) screening. You may need this if you are a current or former smoker. ?Osteoporosis. You may be screened starting at age 19 if you are at high risk. ?Talk with your health care provider about your test results, treatment options, and if necessary, the need for more tests. ?Vaccines  ?Your health care provider may recommend certain vaccines, such as: ?Influenza vaccine. This is recommended every year. ?Tetanus, diphtheria, and acellular pertussis (Tdap, Td) vaccine. You may need a Td booster every 10 years. ?Zoster vaccine. You may need this after age 97. ?Pneumococcal 13-valent conjugate (PCV13) vaccine. One dose is recommended after  age 65. ?Pneumococcal  polysaccharide (PPSV23) vaccine. One dose is recommended after age 32. ?Talk to your health care provider about which screenings and vaccines you need and how often you need them. ?This information is not intended to replace advice given to you by your health care provider. Make sure you discuss any questions you have with your health care provider. ?Document Released: 06/06/2015 Document Revised: 01/28/2016 Document Reviewed: 03/11/2015 ?Elsevier Interactive Patient Education ? 2017 [place]. ? ?Fall Prevention in the Home ?Falls can cause injuries. They can happen to people of all ages. There are many things you can do to make your home safe and to help prevent falls. ?What can I do on the outside of my home? ?Regularly fix the edges of walkways and driveways and fix any cracks. ?Remove anything that might make you trip as you walk through a door, such as a raised step or threshold. ?Trim any bushes or trees on the path to your home. ?Use bright outdoor lighting. ?Clear any walking paths of anything that might make someone trip, such as rocks or tools. ?Regularly check to see if handrails are loose or broken. Make sure that both sides of any steps have handrails. ?Any raised decks and porches should have guardrails on the edges. ?Have any leaves, snow, or ice cleared regularly. ?Use sand or salt on walking paths during winter. ?Clean up any spills in your garage right away. This includes oil or grease spills. ?What can I do in the bathroom? ?Use night lights. ?Install grab bars by the toilet and in the tub and shower. Do not use towel bars as grab bars. ?Use non-skid mats or decals in the tub or shower. ?If you need to sit down in the shower, use a plastic, non-slip stool. ?Keep the floor dry. Clean up any water that spills on the floor as soon as it happens. ?Remove soap buildup in the tub or shower regularly. ?Attach bath mats securely with double-sided non-slip rug tape. ?Do not have throw rugs and other  things on the floor that can make you trip. ?What can I do in the bedroom? ?Use night lights. ?Make sure that you have a light by your bed that is easy to reach. ?Do not use any sheets or blankets that are too big for your bed. They should not hang down onto the floor. ?Have a firm chair that has side arms. You can use this for support while you get dressed. ?Do not have throw rugs and other things on the floor that can make you trip. ?What can I do in the kitchen? ?Clean up any spills right away. ?Avoid walking on wet floors. ?Keep items that you use a lot in easy-to-reach places. ?If you need to reach something above you, use a strong step stool that has a grab bar. ?Keep electrical cords out of the way. ?Do not use floor polish or wax that makes floors slippery. If you must use wax, use non-skid floor wax. ?Do not have throw rugs and other things on the floor that can make you trip. ?What can I do with my stairs? ?Do not leave any items on the stairs. ?Make sure that there are handrails on both sides of the stairs and use them. Fix handrails that are broken or loose. Make sure that handrails are as long as the stairways. ?Check any carpeting to make sure that it is firmly attached to the stairs. Fix any carpet that is loose or worn. ?Avoid having throw rugs  at the top or bottom of the stairs. If you do have throw rugs, attach them to the floor with carpet tape. ?Make sure that you have a light switch at the top of the stairs and the bottom of the stairs. If you do not have them, ask someone to add them for you. ?What else can I do to help prevent falls? ?Wear shoes that: ?Do not have high heels. ?Have rubber bottoms. ?Are comfortable and fit you well. ?Are closed at the toe. Do not wear sandals. ?If you use a stepladder: ?Make sure that it is fully opened. Do not climb a closed stepladder. ?Make sure that both sides of the stepladder are locked into place. ?Ask someone to hold it for you, if possible. ?Clearly  mark and make sure that you can see: ?Any grab bars or handrails. ?First and last steps. ?Where the edge of each step is. ?Use tools that help you move around (mobility aids) if they are needed. These inc

## 2021-09-14 DIAGNOSIS — L509 Urticaria, unspecified: Secondary | ICD-10-CM | POA: Diagnosis not present

## 2021-09-14 DIAGNOSIS — L308 Other specified dermatitis: Secondary | ICD-10-CM | POA: Diagnosis not present

## 2021-09-14 DIAGNOSIS — Z85820 Personal history of malignant melanoma of skin: Secondary | ICD-10-CM | POA: Diagnosis not present

## 2021-09-22 DIAGNOSIS — D123 Benign neoplasm of transverse colon: Secondary | ICD-10-CM | POA: Diagnosis not present

## 2021-09-22 DIAGNOSIS — D122 Benign neoplasm of ascending colon: Secondary | ICD-10-CM | POA: Diagnosis not present

## 2021-09-22 DIAGNOSIS — Z86010 Personal history of colonic polyps: Secondary | ICD-10-CM | POA: Diagnosis not present

## 2021-09-22 DIAGNOSIS — Z1211 Encounter for screening for malignant neoplasm of colon: Secondary | ICD-10-CM | POA: Diagnosis not present

## 2021-10-14 ENCOUNTER — Other Ambulatory Visit: Payer: Self-pay | Admitting: Internal Medicine

## 2021-10-15 ENCOUNTER — Other Ambulatory Visit: Payer: Self-pay | Admitting: Internal Medicine

## 2021-11-05 DIAGNOSIS — H524 Presbyopia: Secondary | ICD-10-CM | POA: Diagnosis not present

## 2021-11-16 DIAGNOSIS — H8111 Benign paroxysmal vertigo, right ear: Secondary | ICD-10-CM | POA: Diagnosis not present

## 2021-11-22 NOTE — Progress Notes (Deleted)
Cardiology Office Note  Date: 11/22/2021   ID: Danny Johnson, DOB 07-13-1950, MRN [id number]  PCP:  Binnie Rail, MD  Cardiologist:  Rozann Lesches, MD Electrophysiologist:  None   No chief complaint on file.   History of Present Illness: Danny Johnson is a 71 y.o. male last seen in October 2022 in the structural heart clinic, I reviewed the note.  He is status post successful TAVR with placement of 29 mm Edwards SAPIEN 3 THV via transfemoral approach in September 2022.  Follow-up echocardiogram in October 2022 revealed LVEF 55 to 60% with stable TAVR function, mean gradient 9 mmHg, and no paravalvular regurgitation.  Past Medical History:  Diagnosis Date   Anxiety    Arthritis    Asthma    Bicuspid aortic valve    Coronary atherosclerosis of native coronary artery    Minimal, LVEF 60%   Depression    Essential hypertension, benign    History of hiatal hernia 12/2020   Melanoma ([place]) 1993   Mixed hyperlipidemia    PVC's (premature ventricular contractions)    S/P TAVR (transcatheter aortic valve replacement) 02/03/2021   4m Edwards S3 via the TF approach by Dr. MBuena Irishand Dr. BCyndia Bent  Sleep apnea     Past Surgical History:  Procedure Laterality Date   HERNIA REPAIR  12/2017   INTRAOPERATIVE TRANSTHORACIC ECHOCARDIOGRAM Left 02/03/2021   Procedure: INTRAOPERATIVE TRANSTHORACIC ECHOCARDIOGRAM;  Surgeon: MBurnell Blanks MD;  [facility]  Service: Open Heart Surgery;  Laterality: Left;   RIGHT/LEFT HEART CATH AND CORONARY ANGIOGRAPHY N/A 01/12/2021   Procedure: RIGHT/LEFT HEART CATH AND CORONARY ANGIOGRAPHY;  Surgeon: MBurnell Blanks MD;  [facility];  Service: Cardiovascular;  Laterality: N/A;   TONSILLECTOMY     TRANSCATHETER AORTIC VALVE REPLACEMENT, TRANSFEMORAL N/A 02/03/2021   Procedure: TRANSCATHETER AORTIC VALVE REPLACEMENT, TRANSFEMORAL;  Surgeon: MBurnell Blanks MD;  [facility]  Service: Open Heart  Surgery;  Laterality: N/A;   ULTRASOUND GUIDANCE FOR VASCULAR ACCESS Bilateral 02/03/2021   Procedure: ULTRASOUND GUIDANCE FOR VASCULAR ACCESS;  Surgeon: MBurnell Blanks MD;  [facility]  Service: Open Heart Surgery;  Laterality: Bilateral;    Current Outpatient Medications  Medication Sig Dispense Refill   albuterol (VENTOLIN HFA) 108 (90 Base) MCG/ACT inhaler INHALE 2 PUFFS BY MOUTH INTO THE LUNGS EVERY 6 HOURS AS NEEDED FOR WHEEZING OR SHORTNESS OF BREATH 54 g 2   aspirin EC 81 MG tablet Take 1 tablet (81 mg total) by mouth every other day. 90 tablet 3   atorvastatin (LIPITOR) 10 MG tablet TAKE 1 TABLET BY MOUTH DAILY. 90 tablet 3   fluticasone-salmeterol (ADVAIR HFA) 230-21 MCG/ACT inhaler Inhale 2 puffs into the lungs 2 (two) times daily. 1 each 5   lisinopril (ZESTRIL) 20 MG tablet Take 1 tablet (20 mg total) by mouth daily. 90 tablet 3   Multiple Vitamin (MULTIVITAMIN WITH MINERALS) TABS tablet Take 1 tablet by mouth daily.     Omega-3 Fatty Acids (FISH OIL PO) Take 1 capsule by mouth in the morning and at bedtime.     sildenafil (REVATIO) 20 MG tablet TAKE 3-5 TABLETS BY MOUTH DAILY AS NEEDED 60 tablet 2   venlafaxine XR (EFFEXOR-XR) 37.5 MG 24 hr capsule Take 1 capsule (37.5 mg total) by mouth daily with breakfast. Annual appt due in July must see provider for future refills 90 capsule 0   No current facility-administered medications for this visit.   Allergies:  Dust mite extract and Other   Social History: The patient  reports that he has quit smoking. His smoking use included cigarettes. He started smoking about 20 years ago. He has a 5.00 pack-year smoking history. He has never used smokeless tobacco. He reports current alcohol use of about 46.0 standard drinks of alcohol per week. He reports that he does not use drugs.   Family History: The patient's family history includes Dementia in his mother; Heart Problems in his father and mother.   ROS:  Please see the  history of present illness. Otherwise, complete review of systems is positive for {NONE DEFAULTED:18576}.  All other systems are reviewed and negative.   Physical Exam: VS:  There were no vitals taken for this visit., BMI There is no height or weight on file to calculate BMI.  Wt Readings from Last 3 Encounters:  06/01/21 239 lb (108.4 kg)  05/06/21 235 lb 6.4 oz (106.8 kg)  03/17/21 234 lb (106.1 kg)    General: Patient appears comfortable at rest. HEENT: Conjunctiva and lids normal, oropharynx clear with moist mucosa. Neck: Supple, no elevated JVP or carotid bruits, no thyromegaly. Lungs: Clear to auscultation, nonlabored breathing at rest. Cardiac: Regular rate and rhythm, no S3 or significant systolic murmur, no pericardial rub. Abdomen: Soft, nontender, no hepatomegaly, bowel sounds present, no guarding or rebound. Extremities: No pitting edema, distal pulses 2+. Skin: Warm and dry. Musculoskeletal: No kyphosis. Neuropsychiatric: Alert and oriented x3, affect grossly appropriate.  ECG:  An ECG dated 02/12/2021 was personally reviewed today and demonstrated:  Sinus rhythm with frequent PVCs.  Recent Labwork: 01/30/2021: ALT 26; AST 26 02/04/2021: Hemoglobin 14.1; Platelets 181 03/19/2021: BUN 15; Creatinine, Ser 0.76; Potassium 4.6; Sodium 136     Component Value Date/Time   CHOL 133 10/07/2020 0916   TRIG 55.0 10/07/2020 0916   TRIG 89 10/25/2008 0000   HDL 54.80 10/07/2020 0916   CHOLHDL 2 10/07/2020 0916   VLDL 11.0 10/07/2020 0916   LDLCALC 68 10/07/2020 0916   LDLDIRECT 153.1 06/29/2012 0929    Other Studies Reviewed Today:  Echocardiogram 03/05/2021:  1. Left ventricular ejection fraction, by estimation, is 55 to 60%. The  left ventricle has normal function. The left ventricle has no regional  wall motion abnormalities. The left ventricular internal cavity size was  mildly dilated. Left ventricular  diastolic parameters were normal.   2. Right ventricular systolic  function is normal. The right ventricular  size is normal.   3. The mitral valve is normal in structure. Mild mitral valve  regurgitation.   4. S/p TAVR 29 mm Edwards Ultra. Peak and mean gradients through the  valve are 13 and 9 mm Hg respectively COmpared to previous echo from Sept  2022, mean graidnet is less (previously 14 mm Hg). Aortic valve  regurgitation is not visualized.   Assessment and Plan:    Medication Adjustments/Labs and Tests Ordered: Current medicines are reviewed at length with the patient today.  Concerns regarding medicines are outlined above.   Tests Ordered: No orders of the defined types were placed in this encounter.   Medication Changes: No orders of the defined types were placed in this encounter.   Disposition:  Follow up {follow up:15908}  Signed, Satira Sark, MD, [facility] 11/22/2021 11:41 AM    [facility], [place], [place] [address] Phone: [phone]; Fax: [phone]

## 2021-11-25 ENCOUNTER — Ambulatory Visit: Payer: Medicare Other | Admitting: Cardiology

## 2021-11-25 DIAGNOSIS — Z952 Presence of prosthetic heart valve: Secondary | ICD-10-CM

## 2021-11-26 ENCOUNTER — Encounter: Payer: Self-pay | Admitting: Cardiology

## 2021-11-29 ENCOUNTER — Encounter: Payer: Self-pay | Admitting: Internal Medicine

## 2021-11-30 DIAGNOSIS — E785 Hyperlipidemia, unspecified: Secondary | ICD-10-CM | POA: Diagnosis not present

## 2021-11-30 DIAGNOSIS — F1010 Alcohol abuse, uncomplicated: Secondary | ICD-10-CM | POA: Diagnosis not present

## 2021-11-30 DIAGNOSIS — I359 Nonrheumatic aortic valve disorder, unspecified: Secondary | ICD-10-CM | POA: Diagnosis not present

## 2021-11-30 DIAGNOSIS — Z79899 Other long term (current) drug therapy: Secondary | ICD-10-CM | POA: Diagnosis not present

## 2021-11-30 DIAGNOSIS — Z9109 Other allergy status, other than to drugs and biological substances: Secondary | ICD-10-CM | POA: Diagnosis not present

## 2021-11-30 DIAGNOSIS — F17210 Nicotine dependence, cigarettes, uncomplicated: Secondary | ICD-10-CM | POA: Diagnosis not present

## 2021-11-30 DIAGNOSIS — J45909 Unspecified asthma, uncomplicated: Secondary | ICD-10-CM | POA: Diagnosis not present

## 2021-11-30 DIAGNOSIS — F1020 Alcohol dependence, uncomplicated: Secondary | ICD-10-CM | POA: Diagnosis not present

## 2021-11-30 DIAGNOSIS — Z7982 Long term (current) use of aspirin: Secondary | ICD-10-CM | POA: Diagnosis not present

## 2021-11-30 DIAGNOSIS — R002 Palpitations: Secondary | ICD-10-CM | POA: Diagnosis not present

## 2021-12-01 DIAGNOSIS — F1020 Alcohol dependence, uncomplicated: Secondary | ICD-10-CM | POA: Diagnosis not present

## 2021-12-10 ENCOUNTER — Encounter: Payer: Self-pay | Admitting: Internal Medicine

## 2021-12-10 DIAGNOSIS — F329 Major depressive disorder, single episode, unspecified: Secondary | ICD-10-CM

## 2021-12-10 DIAGNOSIS — E782 Mixed hyperlipidemia: Secondary | ICD-10-CM

## 2021-12-10 DIAGNOSIS — R7303 Prediabetes: Secondary | ICD-10-CM

## 2021-12-10 DIAGNOSIS — I10 Essential (primary) hypertension: Secondary | ICD-10-CM

## 2021-12-10 DIAGNOSIS — Z125 Encounter for screening for malignant neoplasm of prostate: Secondary | ICD-10-CM

## 2021-12-13 NOTE — Progress Notes (Signed)
Subjective:    Patient ID: Danny Johnson, male    DOB: [birth year], 71 y.o.   MRN: [id number]     HPI Jaleil is here for a physical exam.   Beginning of July started drinking after a breakup.  He went to [facility] ED 7/10 for etoh detox.  BAL 118.  He normally drinks 3-4 scotch/day and increased that to 6-8 after the breakup.  He did not drink daily.  He did not have symptoms the days he did not drink.  When trying to withdraw he had shakes, tremors, racing heart and dec appetite.  He feels like he got overwhelmed with worrying about his kids, everything going on in the world and his relationship with his girlfriend - they are seeing each other now.  Not sure if depression is really controlled.   He has not drank since the ED.  He feels fine.  He is walking 2-3 times a day and golfing.     Medications and allergies reviewed with patient and updated if appropriate.  Current Outpatient Medications on File Prior to Visit  Medication Sig Dispense Refill   albuterol (VENTOLIN HFA) 108 (90 Base) MCG/ACT inhaler INHALE 2 PUFFS BY MOUTH INTO THE LUNGS EVERY 6 HOURS AS NEEDED FOR WHEEZING OR SHORTNESS OF BREATH 54 g 2   aspirin EC 81 MG tablet Take 1 tablet (81 mg total) by mouth every other day. 90 tablet 3   atorvastatin (LIPITOR) 10 MG tablet TAKE 1 TABLET BY MOUTH DAILY. 90 tablet 3   fluticasone-salmeterol (ADVAIR HFA) 230-21 MCG/ACT inhaler Inhale 2 puffs into the lungs 2 (two) times daily. 1 each 5   lisinopril (ZESTRIL) 20 MG tablet Take 1 tablet (20 mg total) by mouth daily. 90 tablet 3   Multiple Vitamin (MULTIVITAMIN WITH MINERALS) TABS tablet Take 1 tablet by mouth daily.     Omega-3 Fatty Acids (FISH OIL PO) Take 1 capsule by mouth in the morning and at bedtime.     sildenafil (REVATIO) 20 MG tablet TAKE 3-5 TABLETS BY MOUTH DAILY AS NEEDED 60 tablet 2   No current facility-administered medications on file prior to visit.    Review of Systems  Constitutional:  Negative for chills  and fever.  Eyes:  Negative for visual disturbance.  Respiratory:  Negative for cough, shortness of breath and wheezing.   Cardiovascular:  Negative for chest pain, palpitations and leg swelling.  Gastrointestinal:  Negative for abdominal pain, blood in stool, constipation, diarrhea and nausea.       No gerd  Genitourinary:  Negative for difficulty urinating, dysuria and hematuria.  Musculoskeletal:  Positive for arthralgias (left knee pain). Negative for back pain.  Skin:  Negative for rash.  Neurological:  Negative for light-headedness and headaches.  Psychiatric/Behavioral:  Positive for dysphoric mood. The patient is nervous/anxious.        Objective:   Vitals:   12/14/21 1301  BP: 132/80  Pulse: 87  Temp: 98.8 F (37.1 C)  SpO2: 96%   Filed Weights   12/14/21 1301  Weight: 233 lb (105.7 kg)   Body mass index is 29.92 kg/m.  BP Readings from Last 3 Encounters:  12/14/21 132/80  06/01/21 130/72  05/06/21 140/82    Wt Readings from Last 3 Encounters:  12/14/21 233 lb (105.7 kg)  06/01/21 239 lb (108.4 kg)  05/06/21 235 lb 6.4 oz (106.8 kg)      Physical Exam Constitutional: He appears well-developed and well-nourished. No distress.  HENT:  Head: Normocephalic and atraumatic.  Right Ear: External ear normal.  Left Ear: External ear normal.  Mouth/Throat: Oropharynx is clear and moist.  Normal ear canals and TM b/l  Eyes: Conjunctivae and EOM are normal.  Neck: Neck supple. No tracheal deviation present. No thyromegaly present.  No carotid bruit  Cardiovascular: Normal rate, regular rhythm, normal heart sounds and intact distal pulses.   No murmur heard. Pulmonary/Chest: Effort normal and breath sounds normal. No respiratory distress. He has no wheezes. He has no rales.  Abdominal: Soft. He exhibits no distension. There is no tenderness.  Genitourinary: deferred  Musculoskeletal: He exhibits no edema.  Lymphadenopathy:   He has no cervical adenopathy.   Skin: Skin is warm and dry. He is not diaphoretic.  Psychiatric: He has a normal mood and affect. His behavior is normal.         Assessment & Plan:   Physical exam: Screening blood work  ordered - done this morning and we reviewed it Exercise   walking, golf Weight  ok for age Substance abuse   none   Reviewed recommended immunizations.   Health Maintenance  Topic Date Due   COVID-19 Vaccine (4 - Booster for Pfizer series) 05/02/2020   INFLUENZA VACCINE  12/22/2021   COLONOSCOPY (Pts 45-13yr Insurance coverage will need to be confirmed)  07/21/2026   TETANUS/TDAP  07/23/2029   Pneumonia Vaccine 71 Years old  Completed   Hepatitis C Screening  Completed   HPV VACCINES  Aged Out   Zoster Vaccines- Shingrix  Discontinued     See Problem List for Assessment and Plan of chronic medical problems.

## 2021-12-13 NOTE — Patient Instructions (Addendum)
Medications changes include :   increase effexor to 75 mg daily   Your prescription(s) have been sent to your pharmacy.      Return in about 6 months (around 06/16/2022) for follow up.   Health Maintenance, Male Adopting a healthy lifestyle and getting preventive care are important in promoting health and wellness. Ask your health care provider about: The right schedule for you to have regular tests and exams. Things you can do on your own to prevent diseases and keep yourself healthy. What should I know about diet, weight, and exercise? Eat a healthy diet  Eat a diet that includes plenty of vegetables, fruits, low-fat dairy products, and lean protein. Do not eat a lot of foods that are high in solid fats, added sugars, or sodium. Maintain a healthy weight Body mass index (BMI) is a measurement that can be used to identify possible weight problems. It estimates body fat based on height and weight. Your health care provider can help determine your BMI and help you achieve or maintain a healthy weight. Get regular exercise Get regular exercise. This is one of the most important things you can do for your health. Most adults should: Exercise for at least 150 minutes each week. The exercise should increase your heart rate and make you sweat (moderate-intensity exercise). Do strengthening exercises at least twice a week. This is in addition to the moderate-intensity exercise. Spend less time sitting. Even light physical activity can be beneficial. Watch cholesterol and blood lipids Have your blood tested for lipids and cholesterol at 73 years of age, then have this test every 5 years. You may need to have your cholesterol levels checked more often if: Your lipid or cholesterol levels are high. You are older than 73 years of age. You are at high risk for heart disease. What should I know about cancer screening? Many types of cancers can be detected early and may often be  prevented. Depending on your health history and family history, you may need to have cancer screening at various ages. This may include screening for: Colorectal cancer. Prostate cancer. Skin cancer. Lung cancer. What should I know about heart disease, diabetes, and high blood pressure? Blood pressure and heart disease High blood pressure causes heart disease and increases the risk of stroke. This is more likely to develop in people who have high blood pressure readings or are overweight. Talk with your health care provider about your target blood pressure readings. Have your blood pressure checked: Every 3-5 years if you are 76-26 years of age. Every year if you are 64 years old or older. If you are between the ages of 64 and 83 and are a current or former smoker, ask your health care provider if you should have a one-time screening for abdominal aortic aneurysm (AAA). Diabetes Have regular diabetes screenings. This checks your fasting blood sugar level. Have the screening done: Once every three years after age 21 if you are at a normal weight and have a low risk for diabetes. More often and at a younger age if you are overweight or have a high risk for diabetes. What should I know about preventing infection? Hepatitis B If you have a higher risk for hepatitis B, you should be screened for this virus. Talk with your health care provider to find out if you are at risk for hepatitis B infection. Hepatitis C Blood testing is recommended for: Everyone born from [birth year] through 1967. Anyone with known risk  factors for hepatitis C. Sexually transmitted infections (STIs) You should be screened each year for STIs, including gonorrhea and chlamydia, if: You are sexually active and are younger than 73 years of age. You are older than 73 years of age and your health care provider tells you that you are at risk for this type of infection. Your sexual activity has changed since you were last screened,  and you are at increased risk for chlamydia or gonorrhea. Ask your health care provider if you are at risk. Ask your health care provider about whether you are at high risk for HIV. Your health care provider may recommend a prescription medicine to help prevent HIV infection. If you choose to take medicine to prevent HIV, you should first get tested for HIV. You should then be tested every 3 months for as long as you are taking the medicine. Follow these instructions at home: Alcohol use Do not drink alcohol if your health care provider tells you not to drink. If you drink alcohol: Limit how much you have to 0-2 drinks a day. Know how much alcohol is in your drink. In the [place] one drink equals one 12 oz bottle of beer (355 mL), one 5 oz glass of wine (148 mL), or one 1 oz glass of hard liquor (44 mL). Lifestyle Do not use any products that contain nicotine or tobacco. These products include cigarettes, chewing tobacco, and vaping devices, such as e-cigarettes. If you need help quitting, ask your health care provider. Do not use street drugs. Do not share needles. Ask your health care provider for help if you need support or information about quitting drugs. General instructions Schedule regular health, dental, and eye exams. Stay current with your vaccines. Tell your health care provider if: You often feel depressed. You have ever been abused or do not feel safe at home. Summary Adopting a healthy lifestyle and getting preventive care are important in promoting health and wellness. Follow your health care provider's instructions about healthy diet, exercising, and getting tested or screened for diseases. Follow your health care provider's instructions on monitoring your cholesterol and blood pressure. This information is not intended to replace advice given to you by your health care provider. Make sure you discuss any questions you have with your health care provider. Document Revised:  09/29/2020 Document Reviewed: 09/29/2020 Elsevier Patient Education  [employer].

## 2021-12-14 ENCOUNTER — Encounter: Payer: Self-pay | Admitting: Internal Medicine

## 2021-12-14 ENCOUNTER — Ambulatory Visit (INDEPENDENT_AMBULATORY_CARE_PROVIDER_SITE_OTHER): Payer: Medicare Other | Admitting: Internal Medicine

## 2021-12-14 VITALS — BP 132/80 | HR 87 | Temp 98.8°F | Ht 74.0 in | Wt 233.0 lb

## 2021-12-14 DIAGNOSIS — E782 Mixed hyperlipidemia: Secondary | ICD-10-CM | POA: Diagnosis not present

## 2021-12-14 DIAGNOSIS — F329 Major depressive disorder, single episode, unspecified: Secondary | ICD-10-CM

## 2021-12-14 DIAGNOSIS — F32A Depression, unspecified: Secondary | ICD-10-CM

## 2021-12-14 DIAGNOSIS — Z125 Encounter for screening for malignant neoplasm of prostate: Secondary | ICD-10-CM | POA: Diagnosis not present

## 2021-12-14 DIAGNOSIS — F1020 Alcohol dependence, uncomplicated: Secondary | ICD-10-CM

## 2021-12-14 DIAGNOSIS — I10 Essential (primary) hypertension: Secondary | ICD-10-CM

## 2021-12-14 DIAGNOSIS — R7303 Prediabetes: Secondary | ICD-10-CM

## 2021-12-14 DIAGNOSIS — J4530 Mild persistent asthma, uncomplicated: Secondary | ICD-10-CM

## 2021-12-14 DIAGNOSIS — Z0000 Encounter for general adult medical examination without abnormal findings: Secondary | ICD-10-CM | POA: Diagnosis not present

## 2021-12-14 DIAGNOSIS — J45909 Unspecified asthma, uncomplicated: Secondary | ICD-10-CM | POA: Insufficient documentation

## 2021-12-14 DIAGNOSIS — F419 Anxiety disorder, unspecified: Secondary | ICD-10-CM

## 2021-12-14 LAB — COMPREHENSIVE METABOLIC PANEL
ALT: 25 U/L (ref 0–53)
AST: 20 U/L (ref 0–37)
Albumin: 4.4 g/dL (ref 3.5–5.2)
Alkaline Phosphatase: 71 U/L (ref 39–117)
BUN: 9 mg/dL (ref 6–23)
CO2: 29 mEq/L (ref 19–32)
Calcium: 9.3 mg/dL (ref 8.4–10.5)
Chloride: 101 mEq/L (ref 96–112)
Creatinine, Ser: 0.8 mg/dL (ref 0.40–1.50)
GFR: 88.16 mL/min (ref >60.00)
Glucose, Bld: 101 mg/dL — ABNORMAL HIGH (ref 70–99)
Potassium: 5.2 mEq/L — ABNORMAL HIGH (ref 3.5–5.1)
Sodium: 137 mEq/L (ref 135–145)
Total Bilirubin: 0.4 mg/dL (ref 0.2–1.2)
Total Protein: 6.3 g/dL (ref 6.0–8.3)

## 2021-12-14 LAB — CBC WITH DIFFERENTIAL/PLATELET
Basophils Absolute: 0.1 10*3/uL (ref 0.0–0.1)
Basophils Relative: 2 % (ref 0.0–3.0)
Eosinophils Absolute: 0.4 10*3/uL (ref 0.0–0.7)
Eosinophils Relative: 7.9 % — ABNORMAL HIGH (ref 0.0–5.0)
HCT: 44.8 % (ref 39.0–52.0)
Hemoglobin: 15 g/dL (ref 13.0–17.0)
Lymphocytes Relative: 31.2 % (ref 12.0–46.0)
Lymphs Abs: 1.5 10*3/uL (ref 0.7–4.0)
MCHC: 33.6 g/dL (ref 30.0–36.0)
MCV: 101.9 fl — ABNORMAL HIGH (ref 78.0–100.0)
Monocytes Absolute: 0.5 10*3/uL (ref 0.1–1.0)
Monocytes Relative: 10.8 % (ref 3.0–12.0)
Neutro Abs: 2.3 10*3/uL (ref 1.4–7.7)
Neutrophils Relative %: 48.1 % (ref 43.0–77.0)
Platelets: 274 10*3/uL (ref 150.0–400.0)
RBC: 4.39 Mil/uL (ref 4.22–5.81)
RDW: 13 % (ref 11.5–15.5)
WBC: 4.7 10*3/uL (ref 4.0–10.5)

## 2021-12-14 LAB — LIPID PANEL
Cholesterol: 150 mg/dL (ref 0–200)
HDL: 57.1 mg/dL (ref >39.00)
LDL Cholesterol: 81 mg/dL (ref 0–99)
NonHDL: 92.75
Total CHOL/HDL Ratio: 3
Triglycerides: 58 mg/dL (ref 0.0–149.0)
VLDL: 11.6 mg/dL (ref 0.0–40.0)

## 2021-12-14 LAB — HEMOGLOBIN A1C: Hgb A1c MFr Bld: 5.8 % (ref 4.6–6.5)

## 2021-12-14 LAB — PSA, MEDICARE: PSA: 0.34 ng/ml (ref 0.10–4.00)

## 2021-12-14 LAB — TSH: TSH: 1.37 u[IU]/mL (ref 0.35–5.50)

## 2021-12-14 MED ORDER — VENLAFAXINE HCL ER 75 MG PO CP24: 75.0000 mg | ORAL_CAPSULE | Freq: Every day | ORAL | 1 refills | Status: DC

## 2021-12-14 NOTE — Assessment & Plan Note (Addendum)
Chronic Anxiety and depression not ideally controlled  Increase effexor to 75 mg daily Deferred referral to a therapist Continue regular exercise

## 2021-12-14 NOTE — Assessment & Plan Note (Signed)
Chronic Blood pressure well controlled CMP Continue lisinopril 20 mg daily 

## 2021-12-14 NOTE — Assessment & Plan Note (Signed)
Chronic Check a1c Low sugar / carb diet Stressed regular exercise  

## 2021-12-14 NOTE — Assessment & Plan Note (Signed)
Chronic Regular exercise and healthy diet encouraged Check lipid panel  Continue atorvastatin 10 mg daily 

## 2021-12-14 NOTE — Assessment & Plan Note (Signed)
Chronic Mild, persistent Controlled, stable Continue adviar 230-21 2 puffs bid, albuterol prn

## 2021-12-17 IMAGING — CR DG CHEST 2V
2 series · 2 of 2 positions shown · non-contrast
Comparison: CT from 01/15/2021

CLINICAL DATA: Preoperative evaluation for upcoming TAVR

EXAM:
CHEST - 2 VIEW

[w chest pa]
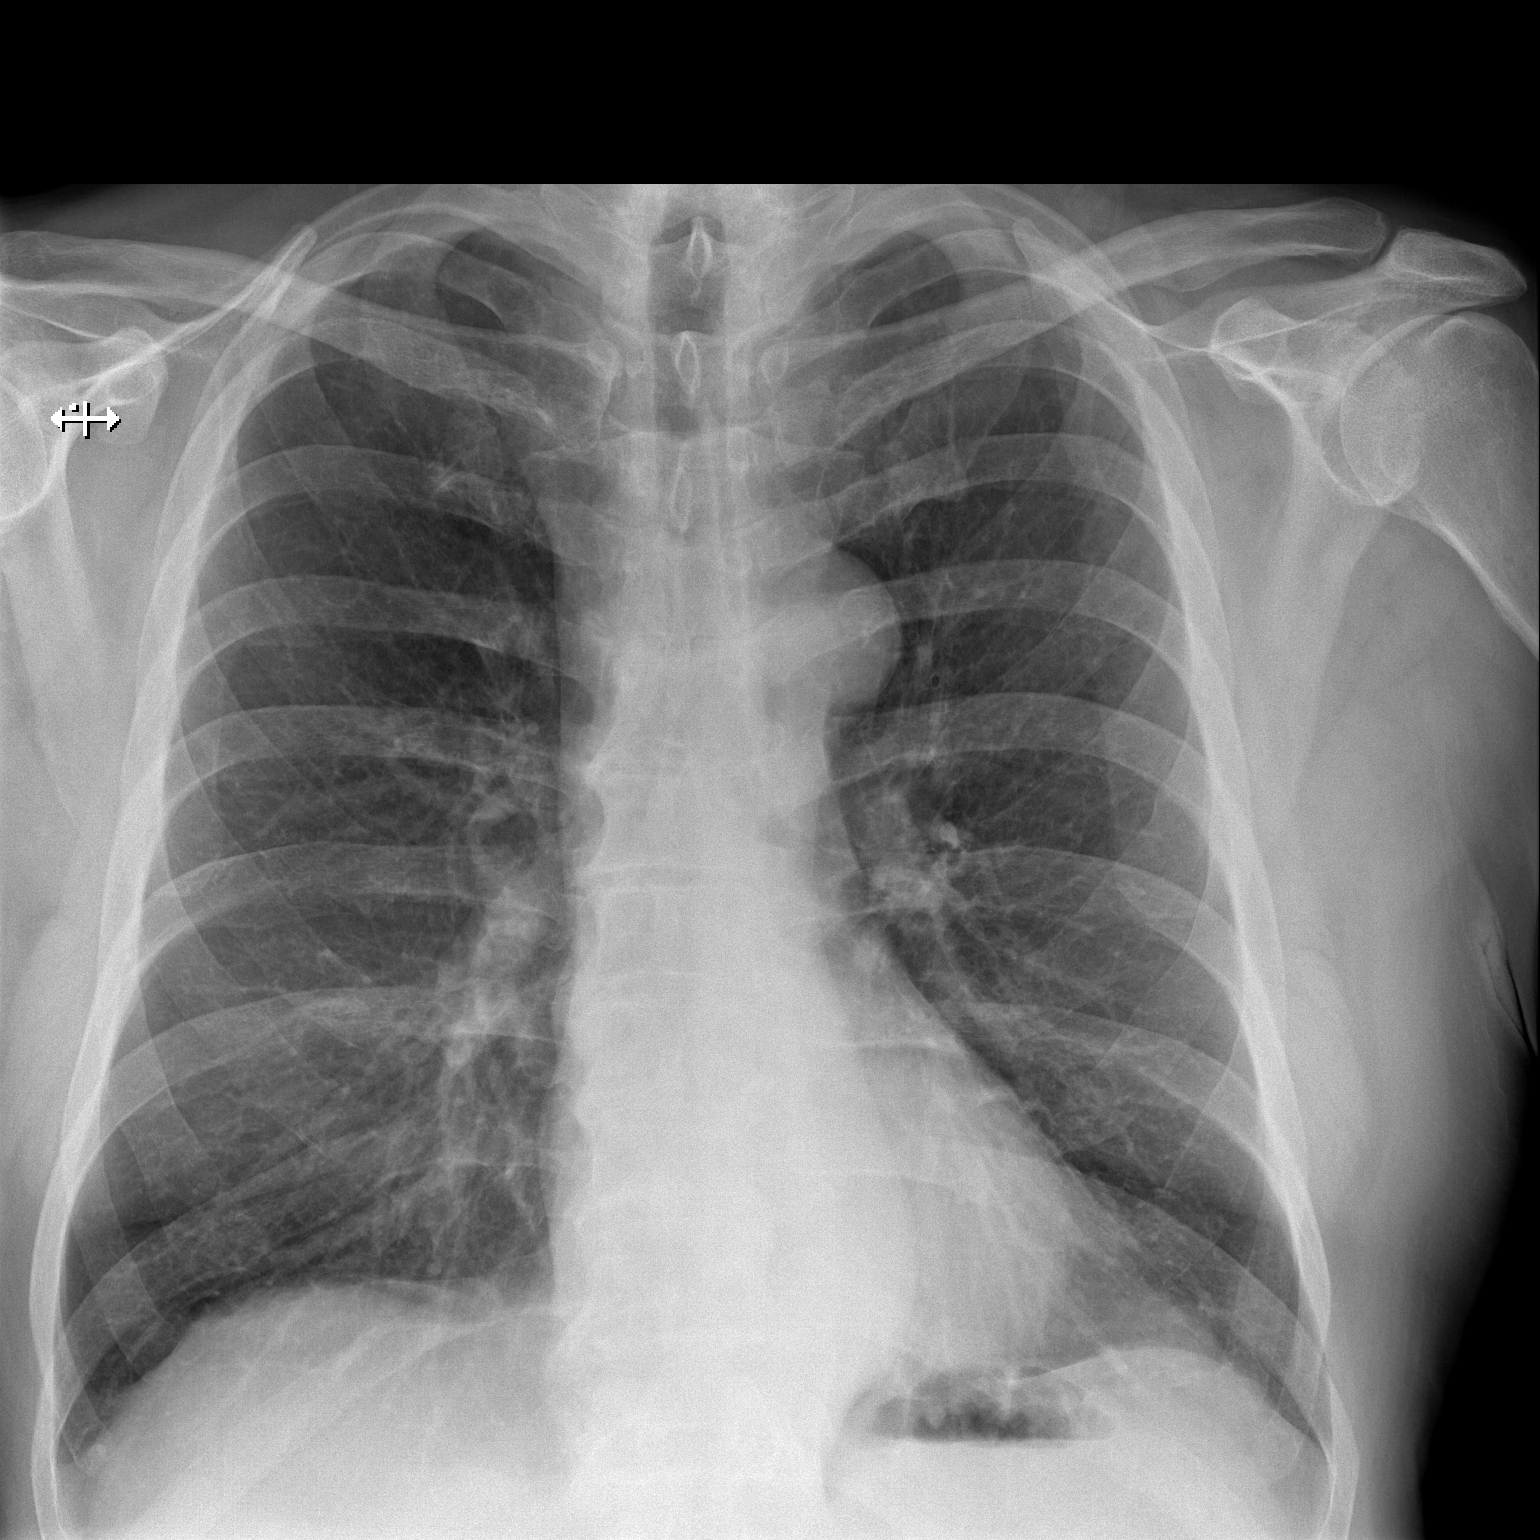

[w chest lat]
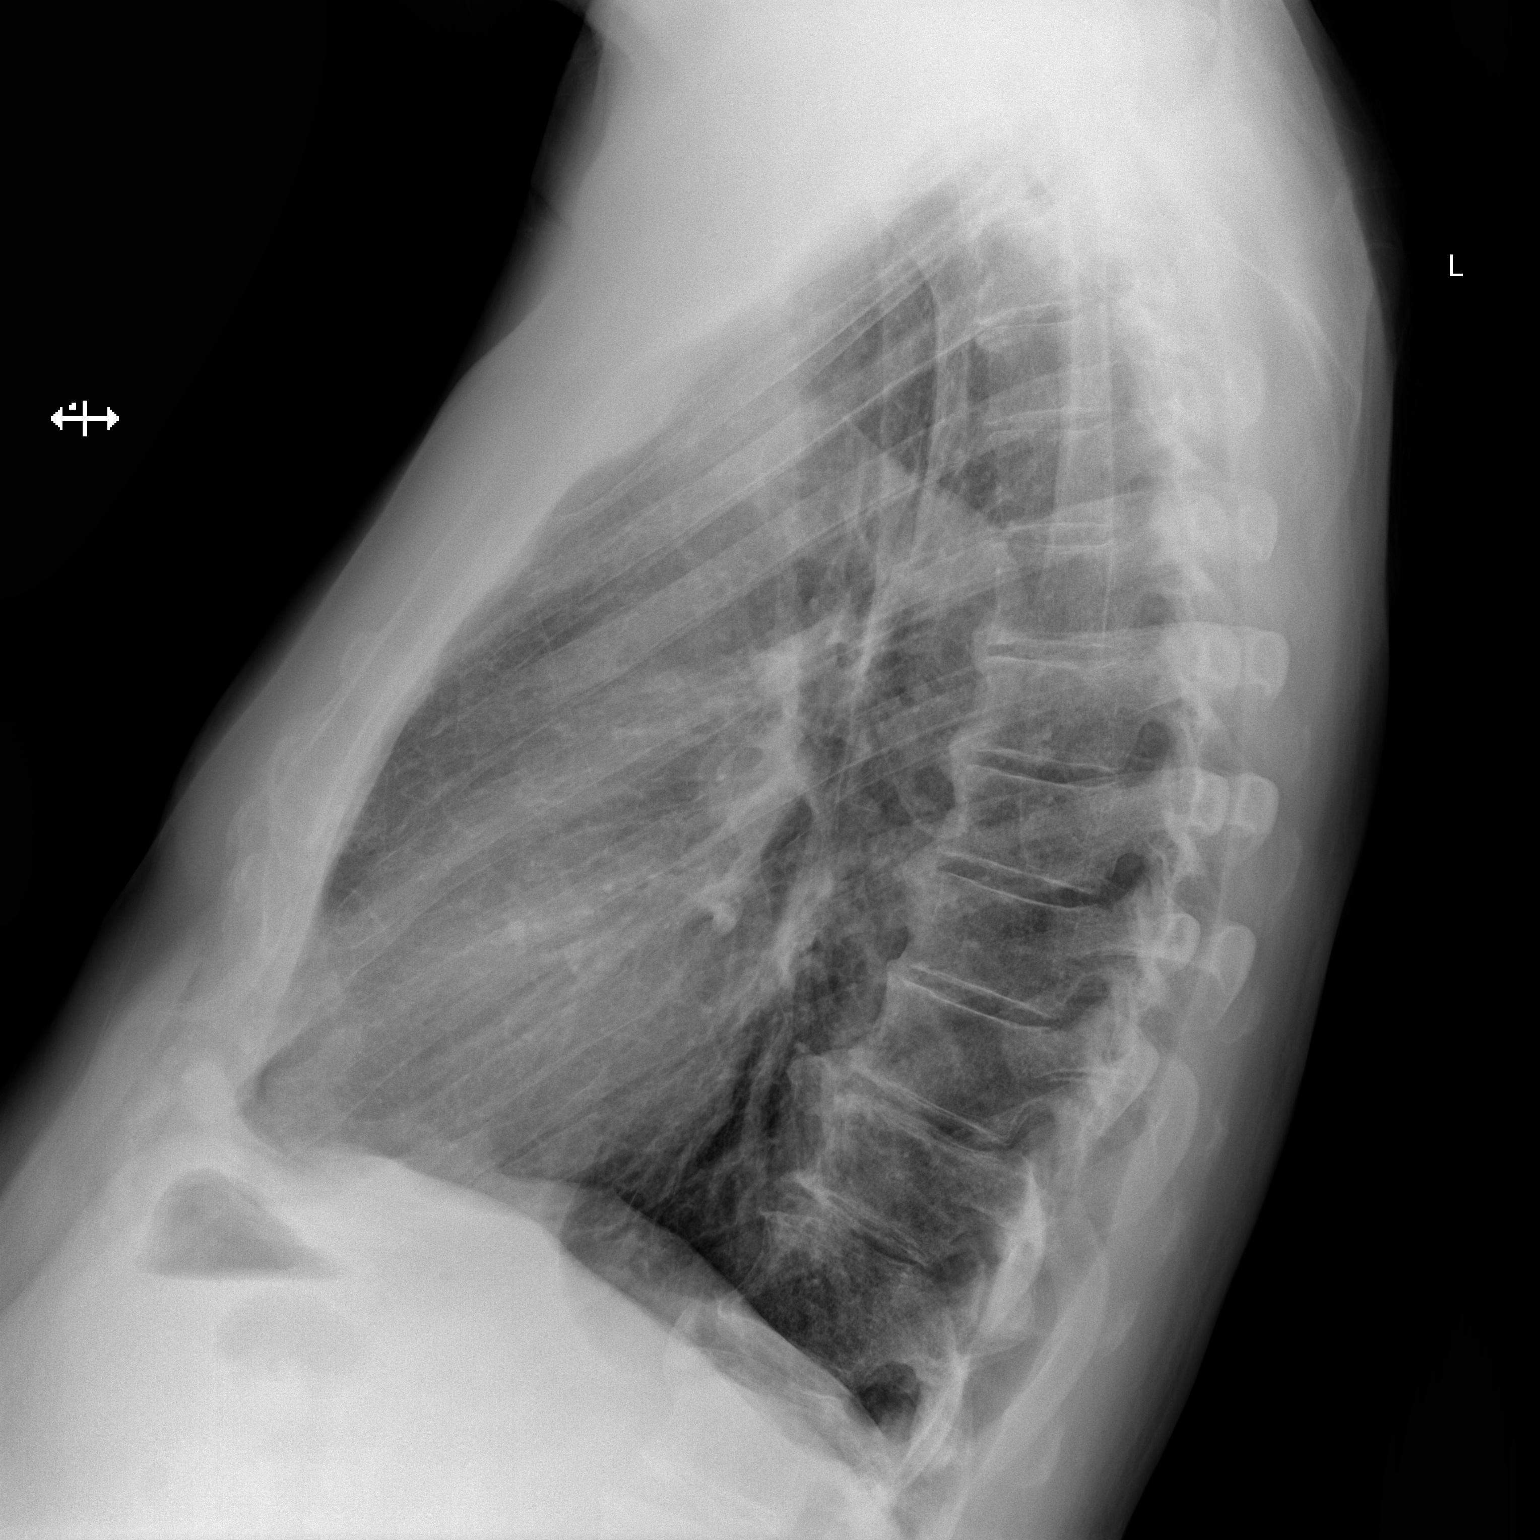

[2 of 2 positions shown; findings below may reference images not displayed]

FINDINGS: Cardiac shadow is within normal limits. The lungs are mildly
hyperinflated but clear. No sizable effusion is noted. Degenerative
changes of the thoracic spine are noted.
IMPRESSION: No active cardiopulmonary disease.

## 2021-12-31 ENCOUNTER — Other Ambulatory Visit: Payer: Self-pay | Admitting: Internal Medicine

## 2022-02-01 IMAGING — DX DG RIBS W/ CHEST 3+V*L*
3 series · 4 of 4 positions shown · non-contrast
Comparison: Chest radiograph 01/30/2021; CT chest 01/15/2021

CLINICAL DATA: Fall 1 week ago. Left lateral rib pain. Evaluate for
fracture. History of smoking.

EXAM:
LEFT RIBS AND CHEST - 3+ VIEW

[Series 1: chest pa · 0.14mm/px · 2 of 2 slices shown]
[im 1/2]
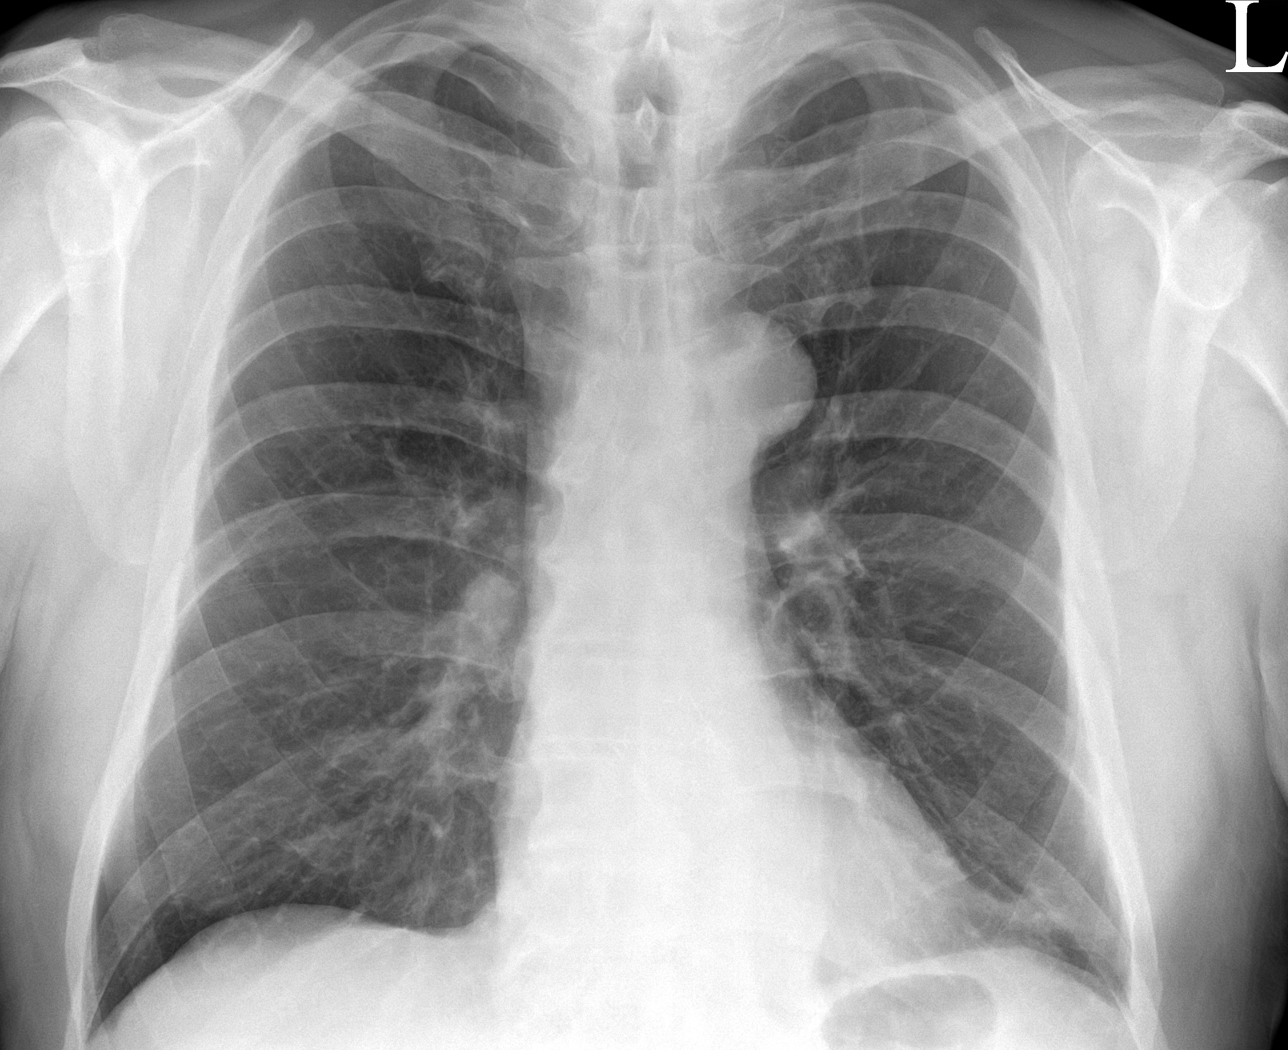
[im 2/2]
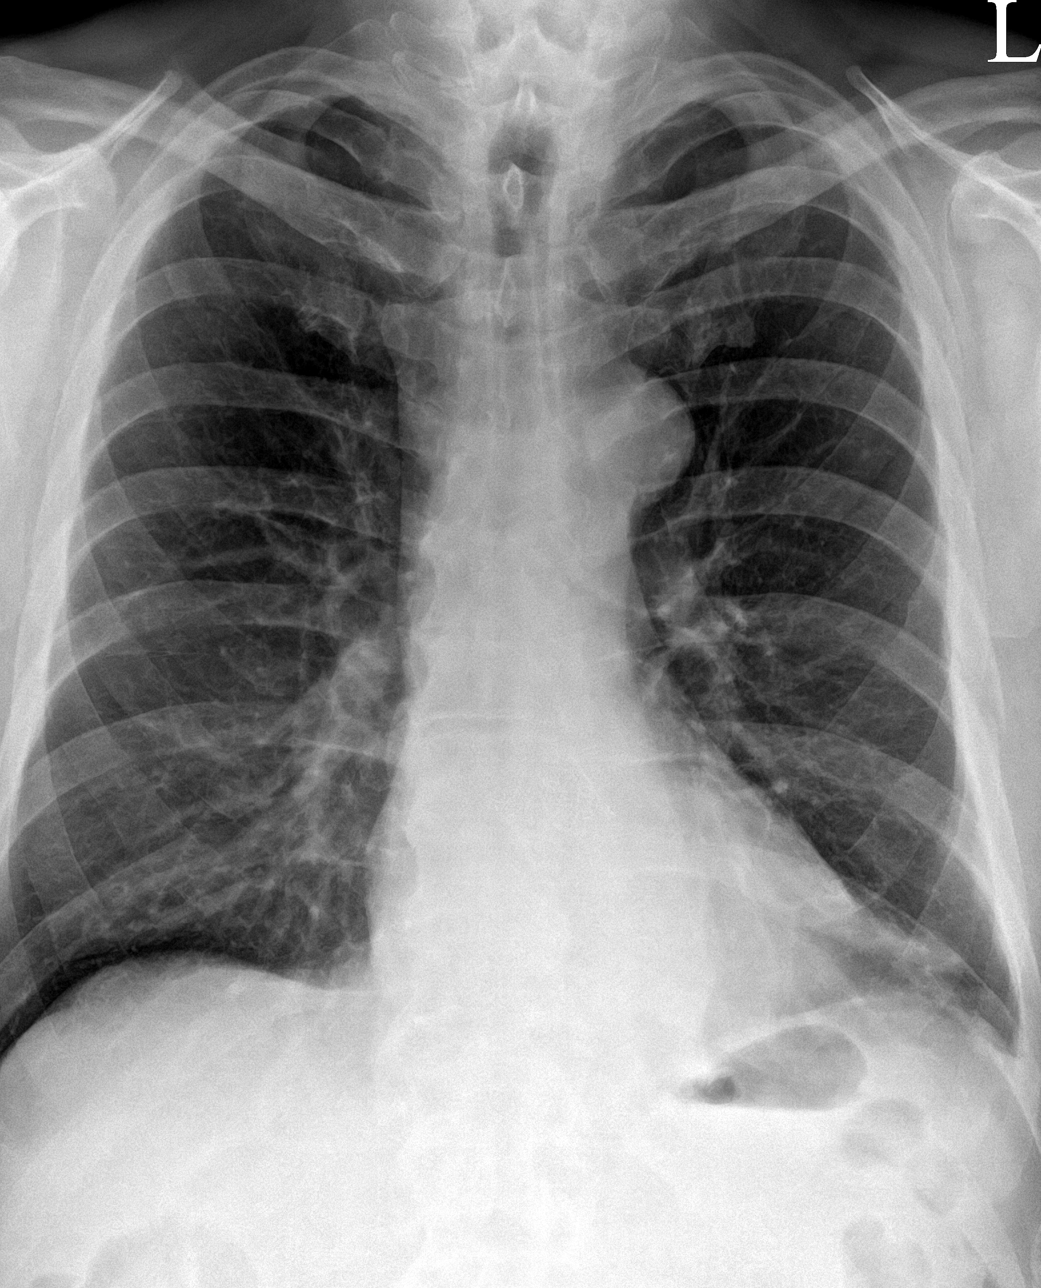

[rib ap]
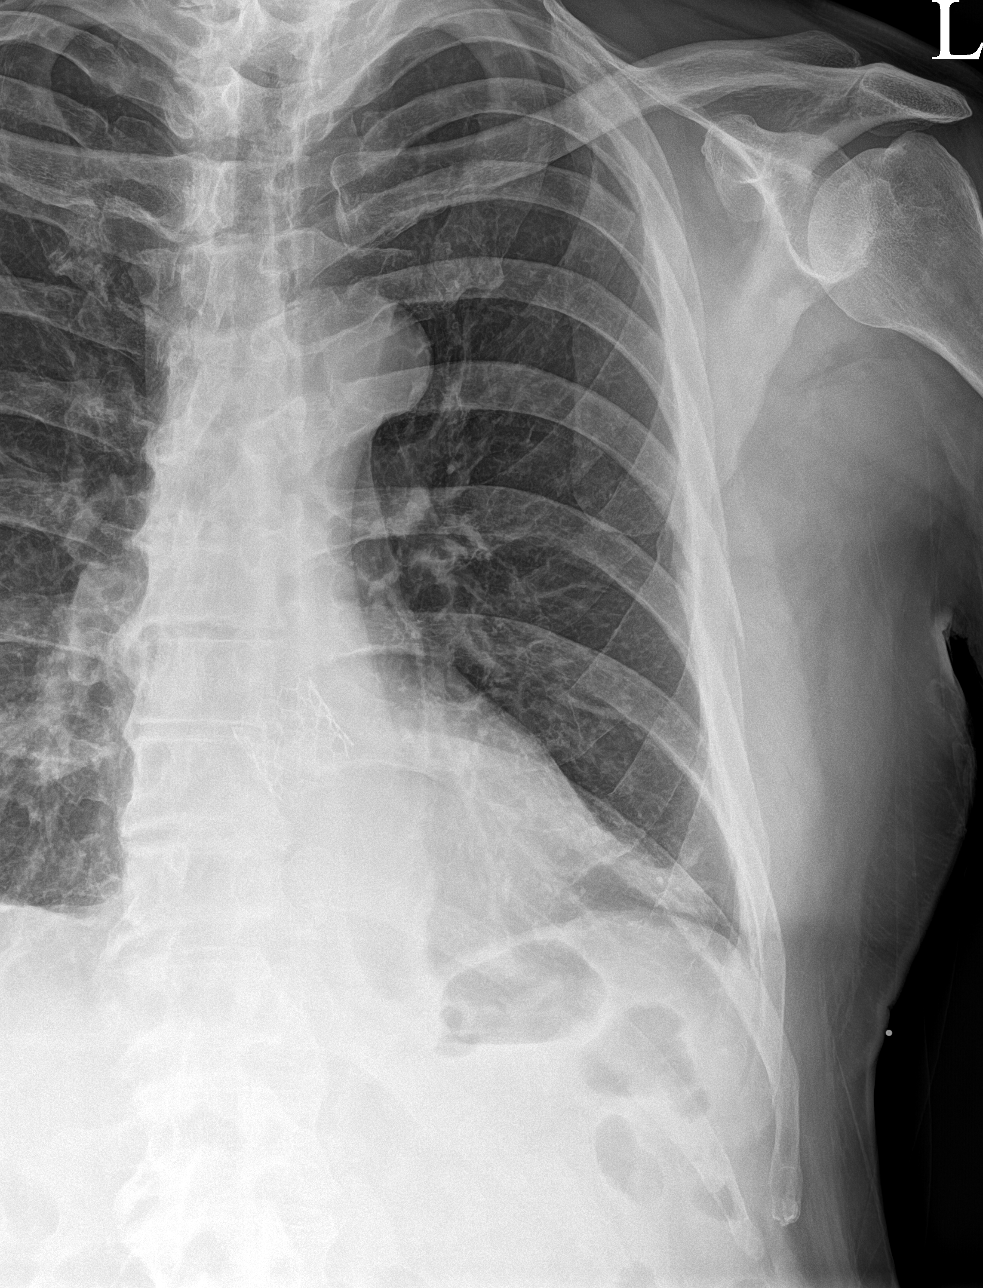

[rib obl]
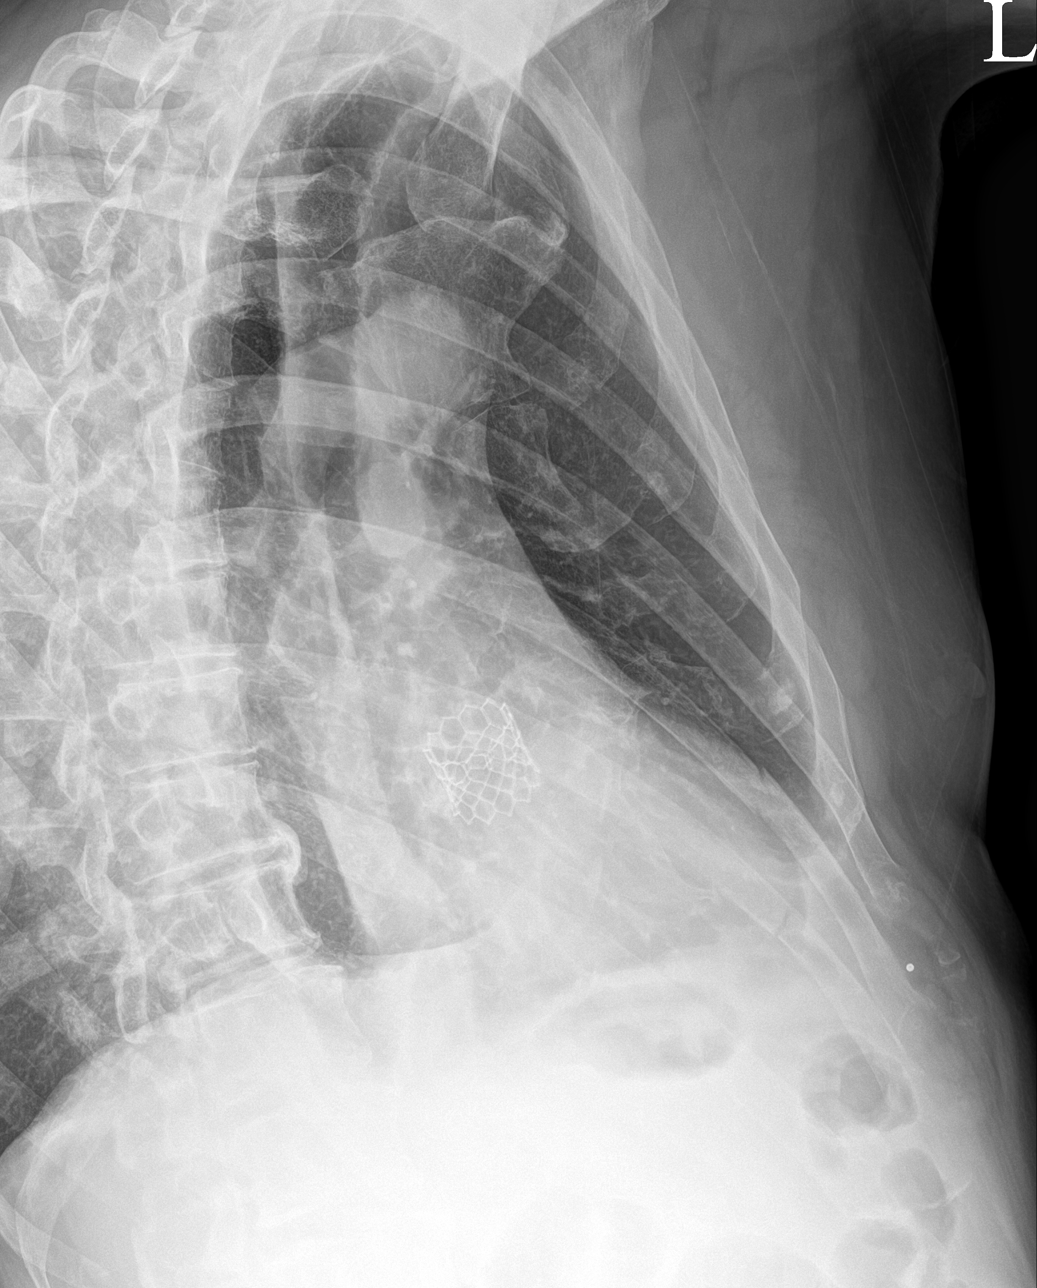

[4 of 4 positions shown; findings below may reference images not displayed]

FINDINGS: Minimally displaced fractures of left lateral sixth and seventh ribs
and nondisplaced acute fracture of the anterolateral left eighth
rib. Old healed fracture of the anterior left fourth and fifth ribs.

Subsegmental atelectasis at the left lung base. No pleural effusion.
Heart is normal in size. Postoperative changes of TAVR.
IMPRESSION: Minimally displaced fractures of left lateral sixth and seventh ribs
and nondisplaced acute fracture of the anterolateral left eighth
rib.

## 2022-02-02 NOTE — Progress Notes (Signed)
[facility] [place]                                     Cardiology Office Note:    Date:  02/04/2022   ID:  Danny Johnson, DOB 1950-11-28, MRN [id number]  PCP:  Binnie Rail, MD  [facility] Cardiologist:  Rozann Lesches, MD / Dr. Angelena Form & Dr. Cyndia Bent (TAVR) [facility] Electrophysiologist:  None   Referring MD: Binnie Rail, MD   1 year s/p TAVR  History of Present Illness:    Danny Johnson is a 71 y.o. male with a hx of anxiety, arthritis, asthma, HTN, HLD and functionally bicuspid aortic valve with paradoxical LFLG aortic stenosis s/p TAVR (02/03/21) who presents to clinic for follow up.   Patient has a history of aortic stenosis followed by Dr. Domenic Polite.  He had an echocardiogram in January 2022 showing a mean gradient of 32 mmHg.  He then developed progressive fatigue with exertion, substernal chest pain and decreased exercise tolerance when playing golf.  Repeat echo on 12/15/2020 showed EF 55%, severe aortic stenosis with a mean gradient of 31 mmHg, AVA 0.59 cm, DVI 0.19, SVI 20.  Cardiac catheterization on 01/12/2021 showed mild nonobstructive coronary disease.  The mean gradient at cath was 33.7 with a peak to peak gradient of 39 mmHg.   He was evaluated by the multidisciplinary valve team and underwent successful TAVR with a 29 mm Edwards Sapien 3 THV via the TF approach on 02/03/21. Post operative echo showed EF 60%, normally functioning TAVR with a mean gradient of 14 mmHg and no PVL. Continue on Asprin and started on plavix '75mg'$  daily to be continued x 6 months. 1 month echo showed EF 55%, normally functioning TAVR with a mean gradient of 9 mmHg and no PVL.  Seen in [facility] ER for alcohol detox in July.   Today the patient presents to clinic for follow up.  No CP or SOB. No LE edema, orthopnea or PND. No dizziness or syncope. No blood in stool or urine. No palpitations. He golfs three times a week and was walking 30 minutes a  day. He has slowed down with age as he was used to being very active and an athlete.    Past Medical History:  Diagnosis Date   Anxiety    Arthritis    Asthma    Bicuspid aortic valve    Coronary atherosclerosis of native coronary artery    Minimal, LVEF 60%   Depression    Essential hypertension, benign    History of hiatal hernia 12/2020   Melanoma ([place]) 1993   Mixed hyperlipidemia    PVC's (premature ventricular contractions)    S/P TAVR (transcatheter aortic valve replacement) 02/03/2021   20m Edwards S3 via the TF approach by Dr. MBuena Irishand Dr. BCyndia Bent  Sleep apnea     Past Surgical History:  Procedure Laterality Date   HERNIA REPAIR  12/2017   INTRAOPERATIVE TRANSTHORACIC ECHOCARDIOGRAM Left 02/03/2021   Procedure: INTRAOPERATIVE TRANSTHORACIC ECHOCARDIOGRAM;  Surgeon: MBurnell Blanks MD;  Location: [facility]  Service: Open Heart Surgery;  Laterality: Left;   RIGHT/LEFT HEART CATH AND CORONARY ANGIOGRAPHY N/A 01/12/2021   Procedure: RIGHT/LEFT HEART CATH AND CORONARY ANGIOGRAPHY;  Surgeon: MBurnell Blanks MD;  Location[facility];  Service: Cardiovascular;  Laterality: N/A;   TONSILLECTOMY  TRANSCATHETER AORTIC VALVE REPLACEMENT, TRANSFEMORAL N/A 02/03/2021   Procedure: TRANSCATHETER AORTIC VALVE REPLACEMENT, TRANSFEMORAL;  Surgeon: Burnell Blanks, MD;  Location: [place];  Service: Open Heart Surgery;  Laterality: N/A;   ULTRASOUND GUIDANCE FOR VASCULAR ACCESS Bilateral 02/03/2021   Procedure: ULTRASOUND GUIDANCE FOR VASCULAR ACCESS;  Surgeon: Burnell Blanks, MD;  Location: [place];  Service: Open Heart Surgery;  Laterality: Bilateral;    Current Medications: Current Meds  Medication Sig   amoxicillin (AMOXIL) 500 MG tablet Take 4 tablets by mouth 1 hour before dental procedures and cleanings     Allergies:   Dust mite extract and Other   Social History   Socioeconomic History   Marital status: Divorced    Spouse name: Not  on file   Number of children: 3   Years of education: Not on file   Highest education level: Not on file  Occupational History   Occupation: Retired-Safety and Environmental Compliance  Tobacco Use   Smoking status: Former    Packs/day: 0.50    Years: 10.00    Total pack years: 5.00    Types: Cigarettes    Start date: 07/21/2001   Smokeless tobacco: Never   Tobacco comments:    quit 2019  Vaping Use   Vaping Use: Never used  Substance and Sexual Activity   Alcohol use: Yes    Alcohol/week: 46.0 standard drinks of alcohol    Types: 4 Shots of liquor, 42 Standard drinks or equivalent per week    Comment: 4-6 drinks daily, beer/wine/scotch   Drug use: No   Sexual activity: Not on file  Other Topics Concern   Not on file  Social History Narrative   Not on file   Social Determinants of Health   Financial Resource Strain: Low Risk  (09/02/2021)   Overall Financial Resource Strain (CARDIA)    Difficulty of Paying Living Expenses: Not hard at all  Food Insecurity: No Food Insecurity (09/02/2021)   Hunger Vital Sign    Worried About Running Out of Food in the Last Year: Never true    Ran Out of Food in the Last Year: Never true  Transportation Needs: No Transportation Needs (09/02/2021)   PRAPARE - Hydrologist (Medical): No    Lack of Transportation (Non-Medical): No  Physical Activity: Sufficiently Active (09/02/2021)   Exercise Vital Sign    Days of Exercise per Week: 7 days    Minutes of Exercise per Session: 60 min  Stress: No Stress Concern Present (09/02/2021)   [place]    Feeling of Stress : Not at all  Social Connections: Unknown (09/02/2021)   Social Connection and Isolation Panel [NHANES]    Frequency of Communication with Friends and Family: More than three times a week    Frequency of Social Gatherings with Friends and Family: More than three times a week    Attends  Religious Services: Patient refused    Marine scientist or Organizations: Yes    Attends Music therapist: More than 4 times per year    Marital Status: Divorced     Family History: The patient's family history includes Dementia in his mother; Heart Problems in his father and mother.  ROS:   Please see the history of present illness.    All other systems reviewed and are negative.  EKGs/Labs/Other Studies Reviewed:    The following studies were reviewed today:  TAVR OPERATIVE NOTE  Date of Procedure:                02/03/2021   Preoperative Diagnosis:      Severe Aortic Stenosis    Postoperative Diagnosis:    Same    Procedure:        Transcatheter Aortic Valve Replacement - Percutaneous Right Transfemoral Approach             Edwards Sapien 3 THV (size 29 mm, model # 9600TFX, serial # [id number])              Co-Surgeons:                        Gaye Pollack, MD and Lauree Chandler, MD     Anesthesiologist:                  Suella Broad, MD   Echocardiographer:              Bertrum Sol, MD   Pre-operative Echo Findings: Severe aortic stenosis Normal left ventricular systolic function   Post-operative Echo Findings: No paravalvular leak Normal left ventricular systolic function   _____________     Echo 02/04/21: IMPRESSIONS   1. Left ventricular ejection fraction, by estimation, is 60 to 65%. The  left ventricle has normal function. The left ventricle has no regional  wall motion abnormalities. Left ventricular diastolic parameters are  consistent with Grade I diastolic  dysfunction (impaired relaxation).   2. Right ventricular systolic function is normal. The right ventricular  size is mildly enlarged. Tricuspid regurgitation signal is inadequate for  assessing PA pressure.   3. Left atrial size was mildly dilated.   4. Right atrial size was mildly dilated.   5. The mitral valve is normal in structure. No evidence of mitral valve   regurgitation.   6. The aortic valve has been repaired/replaced. Aortic valve  regurgitation is not visualized. There is a 29 mm Edwards Ultra, stented  (TAVR) valve present in the aortic position. Procedure Date: 02/03/2021.  Echo findings are consistent with normal  structure and function of the aortic valve prosthesis. Aortic valve area,  by VTI measures 1.98 cm. Aortic valve mean gradient measures 14.0 mmHg.  Aortic valve Vmax measures 2.54 m/s. Aortic valve acceleration time  measures 63 msec.   _________________________  Echo 03/05/21 IMPRESSIONS  1. Left ventricular ejection fraction, by estimation, is 55 to 60%. The  left ventricle has normal function. The left ventricle has no regional  wall motion abnormalities. The left ventricular internal cavity size was  mildly dilated. Left ventricular  diastolic parameters were normal.   2. Right ventricular systolic function is normal. The right ventricular  size is normal.   3. The mitral valve is normal in structure. Mild mitral valve  regurgitation.   4. S/p TAVR 29 mm Edwards Ultra. Peak and mean gradients through the  valve are 13 and 9 mm Hg respectively COmpared to previous echo from Sept  2022, mean graidnet is less (previously 14 mm Hg). Aortic valve  regurgitation is not visualized.   ______________________  Echo 02/03/22 IMPRESSIONS   1. Left ventricular ejection fraction, by estimation, is 50 to 55%. The  left ventricle has low normal function. The left ventricle has no regional  wall motion abnormalities. The left ventricular internal cavity size was  mildly dilated. Left ventricular  diastolic parameters are consistent with Grade I diastolic dysfunction  (impaired relaxation).   2. Right ventricular systolic  function is normal. The right ventricular  size is normal. There is normal pulmonary artery systolic pressure. The  estimated right ventricular systolic pressure is 62.9 mmHg.   3. Left atrial size was  mildly dilated.   4. The mitral valve is normal in structure. Trivial mitral valve  regurgitation.   5. The aortic valve has been repaired/replaced. Aortic valve  regurgitation is trivial. There is a 29 mm Sapien prosthetic (TAVR) valve  present in the aortic position. Procedure Date: 02/03/2021. Echo findings  are consistent with normal structure and function of the aortic valve prosthesis. Aortic valve mean gradient measures 12.0 mmHg. Aortic valve Vmax measures 2.10 m/s. Aortic valve  acceleration time measures 74 msec.   Comparison(s): No significant change from prior study. Prior images  reviewed side by side.   EKG:  EKG is ordered today.  This shows sinus with PVC, HR 73  Recent Labs: 12/14/2021: ALT 25; BUN 9; Creatinine, Ser 0.80; Hemoglobin 15.0; Platelets 274.0; Potassium 5.2; Sodium 137; TSH 1.37  Recent Lipid Panel    Component Value Date/Time   CHOL 150 12/14/2021 1019   TRIG 58.0 12/14/2021 1019   TRIG 89 10/25/2008 0000   HDL 57.10 12/14/2021 1019   CHOLHDL 3 12/14/2021 1019   VLDL 11.6 12/14/2021 1019   LDLCALC 81 12/14/2021 1019   LDLDIRECT 153.1 06/29/2012 0929     Risk Assessment/Calculations:       Physical Exam:    VS:  BP 126/78   Pulse 73   Ht '6\' 4"'$  (1.93 m)   Wt 233 lb 6.4 oz (105.9 kg)   SpO2 98%   BMI 28.41 kg/m     Wt Readings from Last 3 Encounters:  02/03/22 233 lb 6.4 oz (105.9 kg)  12/14/21 233 lb (105.7 kg)  06/01/21 239 lb (108.4 kg)     GEN:  Well nourished, well developed in no acute distress HEENT: Normal NECK: No JVD;  LYMPHATICS: No lymphadenopathy CARDIAC:RRR, soft flow murmur. No rubs, gallops RESPIRATORY:  Clear to auscultation without rales, wheezing or rhonchi  ABDOMEN: Soft, non-tender, non-distended MUSCULOSKELETAL:  No edema; No deformity  SKIN: Warm and dry.   NEUROLOGIC:  Alert and oriented x 3 PSYCHIATRIC:  Normal affect   ASSESSMENT:    1. S/P TAVR (transcatheter aortic valve replacement)   2. Essential  hypertension   3. Mixed hyperlipidemia     PLAN:    In order of problems listed above:  Severe AS s/p TAVR: echo today shows EF 50%, normally functioning TAVR with a mean gradient of 12 mm hg and no PVL. He has NYHA class I symptoms. Remains very active playing golf and walking but has slowed down with age. SBE prophylaxis discussed; he has amoxicillin. Continue on ASA alone. Continue regular follow up with Dr. Domenic Polite.    HTN: BP well controlled today. No changes made.   HLD: continue statin    Medication Adjustments/Labs and Tests Ordered: Current medicines are reviewed at length with the patient today.  Concerns regarding medicines are outlined above.  Orders Placed This Encounter  Procedures   EKG 12-Lead     Meds ordered this encounter  Medications   amoxicillin (AMOXIL) 500 MG tablet    Sig: Take 4 tablets by mouth 1 hour before dental procedures and cleanings    Dispense:  12 tablet    Refill:  6      Patient Instructions  Medication Instructions:  Your physician recommends that you continue on your current medications as directed.  Please refer to the Current Medication list given to you today.  *If you need a refill on your cardiac medications before your next appointment, please call your pharmacy*   Lab Work: None ordered   If you have labs (blood work) drawn today and your tests are completely normal, you will receive your results only by: [place] (if you have MyChart) OR A paper copy in the mail If you have any lab test that is abnormal or we need to change your treatment, we will call you to review the results.   Testing/Procedures: None ordered    Follow-Up: Follow up as scheduled   Other Instructions   Important Information About Sugar         Signed, Angelena Form, PA-C  02/04/2022 9:43 AM    [place]

## 2022-02-03 ENCOUNTER — Ambulatory Visit (HOSPITAL_BASED_OUTPATIENT_CLINIC_OR_DEPARTMENT_OTHER): Payer: Medicare Other

## 2022-02-03 ENCOUNTER — Ambulatory Visit (HOSPITAL_COMMUNITY): Payer: Medicare Other | Attending: Physician Assistant | Admitting: Physician Assistant

## 2022-02-03 VITALS — BP 126/78 | HR 73 | Ht 76.0 in | Wt 233.4 lb

## 2022-02-03 DIAGNOSIS — I10 Essential (primary) hypertension: Secondary | ICD-10-CM | POA: Diagnosis not present

## 2022-02-03 DIAGNOSIS — Z952 Presence of prosthetic heart valve: Secondary | ICD-10-CM

## 2022-02-03 DIAGNOSIS — E782 Mixed hyperlipidemia: Secondary | ICD-10-CM | POA: Diagnosis not present

## 2022-02-03 LAB — ECHOCARDIOGRAM COMPLETE
AR max vel: 1.4 cm2
AV Area VTI: 1.42 cm2
AV Area mean vel: 1.33 cm2
AV Mean grad: 12 mmHg
AV Peak grad: 17.6 mmHg
Ao pk vel: 2.1 m/s
Area-P 1/2: 4.24 cm2
S' Lateral: 4.3 cm
Single Plane A4C EF: 48 %

## 2022-02-03 MED ORDER — AMOXICILLIN 500 MG PO TABS: ORAL_TABLET | ORAL | 6 refills | Status: AC

## 2022-02-03 NOTE — Patient Instructions (Signed)

## 2022-02-08 ENCOUNTER — Other Ambulatory Visit: Payer: Self-pay | Admitting: Internal Medicine

## 2022-02-26 ENCOUNTER — Encounter: Payer: Self-pay | Admitting: Cardiology

## 2022-03-03 ENCOUNTER — Ambulatory Visit: Payer: Medicare Other | Admitting: Cardiology

## 2022-04-21 DIAGNOSIS — L821 Other seborrheic keratosis: Secondary | ICD-10-CM | POA: Diagnosis not present

## 2022-04-21 DIAGNOSIS — L820 Inflamed seborrheic keratosis: Secondary | ICD-10-CM | POA: Diagnosis not present

## 2022-04-26 ENCOUNTER — Other Ambulatory Visit: Payer: Self-pay | Admitting: Internal Medicine

## 2022-06-15 NOTE — Patient Instructions (Addendum)
      Blood work was ordered.   The lab is on the first floor.    Medications changes include :   none    A referral was ordered for ENT.     Someone will call you to schedule an appointment.    Return in about 6 months (around 12/15/2022) for Physical Exam.

## 2022-06-15 NOTE — Progress Notes (Unsigned)
Subjective:    Patient ID: Danny Johnson, male    DOB: 1950-10-17, 72 y.o.   MRN: [id number]     HPI Danny Johnson is here for follow up of his chronic medical problems, including htn, hld, prediabetes, anxiety, depression, h/o alcoholism, asthma  Feeling off balanced, feels unsteady - started this morning.  No dizziness.  He noticed this while driving here.  He thinks it is related to when he looks down.  He does have a history of vertigo and did physical therapy and that helped.  This feels different than that episode.  He states he could be dehydrated.  Fatigued - sleeping 10-11 hrs.  Gets up 2-3 times.  Plays golf but tired.  He snores and has apnea per his girlfriend.  Has tremor b/l hands.  He sees it when he is holding coffee cup at rest.  No issues with eating, drinking.  4-5 cig / day, etoh 3-4 after 5 pm most days.     No exercising - not motivated.  He has gotten on the bike a couple times in the last 3 days but can only go 5 minutes.  He knows everything will get better if he is able to get back into exercise-used to do this regularly  Medications and allergies reviewed with patient and updated if appropriate.  Current Outpatient Medications on File Prior to Visit  Medication Sig Dispense Refill   albuterol (VENTOLIN HFA) 108 (90 Base) MCG/ACT inhaler INHALE TWO PUFFS BY MOUTH INTO THE LUNGS EVERY SIX HOURS AS NEEDED FOR WHEEZING OR SHORTNESS OF BREATH 54 g 2   amoxicillin (AMOXIL) 500 MG tablet Take 4 tablets by mouth 1 hour before dental procedures and cleanings 12 tablet 6   aspirin EC 81 MG tablet Take 1 tablet (81 mg total) by mouth every other day. 90 tablet 3   atorvastatin (LIPITOR) 10 MG tablet TAKE 1 TABLET BY MOUTH DAILY 90 tablet 3   fluticasone-salmeterol (ADVAIR HFA) 230-21 MCG/ACT inhaler Inhale 2 puffs into the lungs 2 (two) times daily. 1 each 5   lisinopril (ZESTRIL) 20 MG tablet Take 1 tablet (20 mg total) by mouth daily. 90 tablet 3   Multiple Vitamin  (MULTIVITAMIN WITH MINERALS) TABS tablet Take 1 tablet by mouth daily.     sildenafil (REVATIO) 20 MG tablet TAKE 3-5 TABLETS BY MOUTH DAILY AS NEEDED 60 tablet 2   venlafaxine XR (EFFEXOR XR) 75 MG 24 hr capsule Take 1 capsule (75 mg total) by mouth daily with breakfast. 90 capsule 1   No current facility-administered medications on file prior to visit.     Review of Systems  Constitutional:  Positive for fatigue. Negative for fever.  Eyes:  Negative for visual disturbance.  Respiratory:  Positive for wheezing. Negative for cough and shortness of breath.   Cardiovascular:  Negative for chest pain, palpitations and leg swelling.  Gastrointestinal:  Negative for nausea.  Neurological:  Positive for light-headedness. Negative for dizziness, weakness, numbness and headaches.       Objective:   Vitals:   06/16/22 0946  BP: 126/80  Pulse: 73  Temp: 98 F (36.7 C)  SpO2: 95%   BP Readings from Last 3 Encounters:  06/16/22 126/80  02/03/22 126/78  12/14/21 132/80   Wt Readings from Last 3 Encounters:  06/16/22 229 lb (103.9 kg)  02/03/22 233 lb 6.4 oz (105.9 kg)  12/14/21 233 lb (105.7 kg)   Body mass index is 27.87 kg/m.  Physical Exam Constitutional:      General: He is not in acute distress.    Appearance: Normal appearance. He is not ill-appearing.  HENT:     Head: Normocephalic and atraumatic.  Eyes:     Conjunctiva/sclera: Conjunctivae normal.  Cardiovascular:     Rate and Rhythm: Normal rate and regular rhythm.     Heart sounds: Normal heart sounds. No murmur heard. Pulmonary:     Effort: Pulmonary effort is normal. No respiratory distress.     Breath sounds: Normal breath sounds. No wheezing or rales.  Musculoskeletal:     Right lower leg: No edema.     Left lower leg: No edema.  Skin:    General: Skin is warm and dry.     Findings: No rash.  Neurological:     Mental Status: He is alert and oriented to person, place, and time. Mental status is at  baseline.     Sensory: No sensory deficit.     Motor: No weakness.  Psychiatric:        Mood and Affect: Mood normal.        Lab Results  Component Value Date   WBC 4.7 12/14/2021   HGB 15.0 12/14/2021   HCT 44.8 12/14/2021   PLT 274.0 12/14/2021   GLUCOSE 101 (H) 12/14/2021   CHOL 150 12/14/2021   TRIG 58.0 12/14/2021   HDL 57.10 12/14/2021   LDLDIRECT 153.1 06/29/2012   LDLCALC 81 12/14/2021   ALT 25 12/14/2021   AST 20 12/14/2021   NA 137 12/14/2021   K 5.2 (H) 12/14/2021   CL 101 12/14/2021   CREATININE 0.80 12/14/2021   BUN 9 12/14/2021   CO2 29 12/14/2021   TSH 1.37 12/14/2021   PSA 0.34 12/14/2021   INR 0.9 01/30/2021   HGBA1C 5.8 12/14/2021     Assessment & Plan:    See Problem List for Assessment and Plan of chronic medical problems.

## 2022-06-16 ENCOUNTER — Encounter: Payer: Self-pay | Admitting: Internal Medicine

## 2022-06-16 ENCOUNTER — Ambulatory Visit (INDEPENDENT_AMBULATORY_CARE_PROVIDER_SITE_OTHER): Payer: Medicare Other | Admitting: Internal Medicine

## 2022-06-16 VITALS — BP 126/80 | HR 73 | Temp 98.0°F | Ht 76.0 in | Wt 229.0 lb

## 2022-06-16 DIAGNOSIS — F17210 Nicotine dependence, cigarettes, uncomplicated: Secondary | ICD-10-CM

## 2022-06-16 DIAGNOSIS — F1020 Alcohol dependence, uncomplicated: Secondary | ICD-10-CM

## 2022-06-16 DIAGNOSIS — J4530 Mild persistent asthma, uncomplicated: Secondary | ICD-10-CM

## 2022-06-16 DIAGNOSIS — F32A Depression, unspecified: Secondary | ICD-10-CM

## 2022-06-16 DIAGNOSIS — I10 Essential (primary) hypertension: Secondary | ICD-10-CM

## 2022-06-16 DIAGNOSIS — R42 Dizziness and giddiness: Secondary | ICD-10-CM | POA: Insufficient documentation

## 2022-06-16 DIAGNOSIS — R7303 Prediabetes: Secondary | ICD-10-CM

## 2022-06-16 DIAGNOSIS — E782 Mixed hyperlipidemia: Secondary | ICD-10-CM

## 2022-06-16 DIAGNOSIS — F419 Anxiety disorder, unspecified: Secondary | ICD-10-CM | POA: Diagnosis not present

## 2022-06-16 DIAGNOSIS — R251 Tremor, unspecified: Secondary | ICD-10-CM

## 2022-06-16 DIAGNOSIS — G4733 Obstructive sleep apnea (adult) (pediatric): Secondary | ICD-10-CM

## 2022-06-16 DIAGNOSIS — R5383 Other fatigue: Secondary | ICD-10-CM

## 2022-06-16 LAB — CBC WITH DIFFERENTIAL/PLATELET
Basophils Absolute: 0.1 10*3/uL (ref 0.0–0.1)
Basophils Relative: 1 % (ref 0.0–3.0)
Eosinophils Absolute: 0.4 10*3/uL (ref 0.0–0.7)
Eosinophils Relative: 6 % — ABNORMAL HIGH (ref 0.0–5.0)
HCT: 46.1 % (ref 39.0–52.0)
Hemoglobin: 15.6 g/dL (ref 13.0–17.0)
Lymphocytes Relative: 24.2 % (ref 12.0–46.0)
Lymphs Abs: 1.6 10*3/uL (ref 0.7–4.0)
MCHC: 34 g/dL (ref 30.0–36.0)
MCV: 100.8 fl — ABNORMAL HIGH (ref 78.0–100.0)
Monocytes Absolute: 0.7 10*3/uL (ref 0.1–1.0)
Monocytes Relative: 10 % (ref 3.0–12.0)
Neutro Abs: 3.8 10*3/uL (ref 1.4–7.7)
Neutrophils Relative %: 58.8 % (ref 43.0–77.0)
Platelets: 213 10*3/uL (ref 150.0–400.0)
RBC: 4.57 Mil/uL (ref 4.22–5.81)
RDW: 13.8 % (ref 11.5–15.5)
WBC: 6.5 10*3/uL (ref 4.0–10.5)

## 2022-06-16 LAB — COMPREHENSIVE METABOLIC PANEL
ALT: 29 U/L (ref 0–53)
AST: 26 U/L (ref 0–37)
Albumin: 4.5 g/dL (ref 3.5–5.2)
Alkaline Phosphatase: 69 U/L (ref 39–117)
BUN: 11 mg/dL (ref 6–23)
CO2: 28 mEq/L (ref 19–32)
Calcium: 9.2 mg/dL (ref 8.4–10.5)
Chloride: 97 mEq/L (ref 96–112)
Creatinine, Ser: 0.75 mg/dL (ref 0.40–1.50)
GFR: 89.57 mL/min (ref >60.00)
Glucose, Bld: 99 mg/dL (ref 70–99)
Potassium: 3.9 mEq/L (ref 3.5–5.1)
Sodium: 134 mEq/L — ABNORMAL LOW (ref 135–145)
Total Bilirubin: 0.6 mg/dL (ref 0.2–1.2)
Total Protein: 7.1 g/dL (ref 6.0–8.3)

## 2022-06-16 LAB — LIPID PANEL
Cholesterol: 162 mg/dL (ref 0–200)
HDL: 68.5 mg/dL (ref >39.00)
LDL Cholesterol: 77 mg/dL (ref 0–99)
NonHDL: 93.24
Total CHOL/HDL Ratio: 2
Triglycerides: 79 mg/dL (ref 0.0–149.0)
VLDL: 15.8 mg/dL (ref 0.0–40.0)

## 2022-06-16 LAB — VITAMIN B12: Vitamin B-12: 335 pg/mL (ref 211–911)

## 2022-06-16 LAB — TSH: TSH: 1.75 u[IU]/mL (ref 0.35–5.50)

## 2022-06-16 LAB — HEMOGLOBIN A1C: Hgb A1c MFr Bld: 5.9 % (ref 4.6–6.5)

## 2022-06-16 MED ORDER — FLUTICASONE-SALMETEROL 250-50 MCG/ACT IN AEPB: 1.0000 | INHALATION_SPRAY | Freq: Two times a day (BID) | RESPIRATORY_TRACT | 5 refills | Status: DC

## 2022-06-16 NOTE — Assessment & Plan Note (Addendum)
Chronic Mild, persistent ?  Controlled-currently not using the Advair Advised using Advair twice daily-will change to a different type of inhaler which may be more affordable-Advair 250-50 mcg per ACT AEPB Continue albuterol prn

## 2022-06-16 NOTE — Assessment & Plan Note (Signed)
Acute Started this morning He states episodic lightheadedness or feeling of off-balance-unsteadiness Denies a true spinning sensation, but does have a history of vertigo He thinks some of the sensation might be related to head movements Likely atypical vertigo No neurological deficits on exam-denies numbness, tingling or weakness in his arms or legs, no changes in vision He will increase his hydration because he may also be dehydrated Advised that if symptoms worsen or do not resolve he needs to call

## 2022-06-16 NOTE — Assessment & Plan Note (Signed)
Chronic Check lipid panel  Continue atorvastatin 10 mg daily Regular exercise and healthy diet encouraged  

## 2022-06-16 NOTE — Assessment & Plan Note (Signed)
Chronic Check a1c Low sugar / carb diet Stressed regular exercise   

## 2022-06-16 NOTE — Assessment & Plan Note (Signed)
States he was tested several years ago and was told he had mild sleep apnea Did not tolerate CPAP and is not currently using it Girlfriend says he does snore and has apnea in the middle of the night He states fatigue after getting a good amount of rest He may consider inspire Referral ordered for ENT

## 2022-06-16 NOTE — Assessment & Plan Note (Signed)
Chronic BP well controlled Continue lisinopril 20 mg daily cmp

## 2022-06-16 NOTE — Assessment & Plan Note (Signed)
Chronic He has cut down on how much he is drinking, but states he is still drinking most nights and 3-4 drinks in the evenings Discussed this is still excessive and he should not be having more than 2 drinks a night and ideally not nightly Discussed this could make his sleep apnea worse and his tremor worse not to mention his depression

## 2022-06-16 NOTE — Assessment & Plan Note (Addendum)
Chronic Somewhat controlled-he does have some depression, but does not want to increase the Effexor.  He knows if he gets back to regular exercise it will help Stressed regular exercise, decrease alcohol intake Continue effexor 75 mg daily

## 2022-06-16 NOTE — Assessment & Plan Note (Signed)
He is actively smoking States he smokes 4-5 cigarettes a day Stressed smoking cessation

## 2022-06-18 ENCOUNTER — Encounter: Payer: Self-pay | Admitting: Internal Medicine

## 2022-06-21 LAB — VITAMIN B1: Vitamin B1 (Thiamine): 9 nmol/L (ref 8–30)

## 2022-07-06 ENCOUNTER — Encounter: Payer: Self-pay | Admitting: Internal Medicine

## 2022-07-08 ENCOUNTER — Other Ambulatory Visit: Payer: Self-pay | Admitting: Internal Medicine

## 2022-07-08 MED ORDER — FLUTICASONE-SALMETEROL 250-50 MCG/ACT IN AEPB: 1.0000 | INHALATION_SPRAY | Freq: Two times a day (BID) | RESPIRATORY_TRACT | 5 refills | Status: DC

## 2022-07-08 NOTE — Addendum Note (Signed)
Addended by: Binnie Rail on: 07/08/2022 08:48 PM   Modules accepted: Orders

## 2022-07-12 ENCOUNTER — Other Ambulatory Visit: Payer: Self-pay | Admitting: Physician Assistant

## 2022-07-14 ENCOUNTER — Encounter: Payer: Self-pay | Admitting: Internal Medicine

## 2022-07-14 ENCOUNTER — Other Ambulatory Visit: Payer: Self-pay

## 2022-07-14 MED ORDER — VENLAFAXINE HCL ER 75 MG PO CP24: ORAL_CAPSULE | ORAL | 0 refills | Status: DC

## 2022-07-20 ENCOUNTER — Other Ambulatory Visit: Payer: Self-pay | Admitting: Internal Medicine

## 2022-07-26 ENCOUNTER — Other Ambulatory Visit: Payer: Self-pay

## 2022-07-26 MED ORDER — VENLAFAXINE HCL ER 75 MG PO CP24: ORAL_CAPSULE | ORAL | 0 refills | Status: DC

## 2022-07-29 DIAGNOSIS — I781 Nevus, non-neoplastic: Secondary | ICD-10-CM | POA: Diagnosis not present

## 2022-07-29 DIAGNOSIS — D1801 Hemangioma of skin and subcutaneous tissue: Secondary | ICD-10-CM | POA: Diagnosis not present

## 2022-07-29 DIAGNOSIS — L739 Follicular disorder, unspecified: Secondary | ICD-10-CM | POA: Diagnosis not present

## 2022-07-29 DIAGNOSIS — C4359 Malignant melanoma of other part of trunk: Secondary | ICD-10-CM | POA: Diagnosis not present

## 2022-08-12 DIAGNOSIS — K080 Exfoliation of teeth due to systemic causes: Secondary | ICD-10-CM | POA: Diagnosis not present

## 2022-08-19 DIAGNOSIS — K080 Exfoliation of teeth due to systemic causes: Secondary | ICD-10-CM | POA: Diagnosis not present

## 2022-09-02 ENCOUNTER — Ambulatory Visit: Payer: Medicare Other | Admitting: Cardiology

## 2022-09-02 NOTE — Progress Notes (Signed)
Cardiology Office Note  Date: 09/03/2022   ID: Danny Johnson, DOB 09/09/1950, MRN [id number]  History of Present Illness: Danny Johnson is a 72 y.o. male last seen in September 2023 by Ms. Geralynn Rile, I reviewed the note.  Our last encounter was in July 2022.  He is status post TAVR with 29 mm Edwards SAPIEN 3 THV in September 2022.  He is here for a routine visit.  Overall doing reasonably well, playing golf 3 days a week.  Has had some fatigue and daytime somnolence although with known OSA, could not tolerate CPAP previously.  He is trying to lose some weight.  I reviewed his echocardiogram from September 2023.  He remains on aspirin, no reported bleeding problems.  Also tolerating Lipitor 10 mg daily, his last LDL was 77.  Physical Exam: VS:  BP 114/68   Pulse 84   Ht 6' 1.5" (1.867 m)   Wt 228 lb 9.6 oz (103.7 kg)   SpO2 96%   BMI 29.75 kg/m , BMI Body mass index is 29.75 kg/m.  Wt Readings from Last 3 Encounters:  09/03/22 228 lb 9.6 oz (103.7 kg)  06/16/22 229 lb (103.9 kg)  02/03/22 233 lb 6.4 oz (105.9 kg)    General: Patient appears comfortable at rest. HEENT: Conjunctiva and lids normal. Neck: Supple, no elevated JVP or carotid bruits. Lungs: Clear to auscultation, nonlabored breathing at rest. Cardiac: Regular rate and rhythm, no S3, 1-2/6 systolic murmur. Extremities: No pitting edema.  ECG:  An ECG dated 02/03/2022 was personally reviewed today and demonstrated:  Sinus rhythm with leftward axis and PVC.  Labwork: 06/16/2022: ALT 29; AST 26; BUN 11; Creatinine, Ser 0.75; Hemoglobin 15.6; Platelets 213.0; Potassium 3.9; Sodium 134; TSH 1.75     Component Value Date/Time   CHOL 162 06/16/2022 1035   TRIG 79.0 06/16/2022 1035   TRIG 89 10/25/2008 0000   HDL 68.50 06/16/2022 1035   CHOLHDL 2 06/16/2022 1035   VLDL 15.8 06/16/2022 1035   LDLCALC 77 06/16/2022 1035   LDLDIRECT 153.1 06/29/2012 0929   Other Studies Reviewed Today:  Echocardiogram  02/03/2022:  1. Left ventricular ejection fraction, by estimation, is 50 to 55%. The  left ventricle has low normal function. The left ventricle has no regional  wall motion abnormalities. The left ventricular internal cavity size was  mildly dilated. Left ventricular  diastolic parameters are consistent with Grade I diastolic dysfunction  (impaired relaxation).   2. Right ventricular systolic function is normal. The right ventricular  size is normal. There is normal pulmonary artery systolic pressure. The  estimated right ventricular systolic pressure is 27.0 mmHg.   3. Left atrial size was mildly dilated.   4. The mitral valve is normal in structure. Trivial mitral valve  regurgitation.   5. The aortic valve has been repaired/replaced. Aortic valve  regurgitation is trivial. There is a 29 mm Sapien prosthetic (TAVR) valve  present in the aortic position. Procedure Date: 02/03/2021. Echo findings  are consistent with normal structure and  function of the aortic valve prosthesis. Aortic valve mean gradient  measures 12.0 mmHg. Aortic valve Vmax measures 2.10 m/s. Aortic valve  acceleration time measures 74 msec.   Assessment and Plan:  1.  Bicuspid aortic valve with severe aortic stenosis status post TAVR with 29 mm Edwards SAPIEN 3 THV in September 2022.  Follow-up echocardiogram in September 2023 revealed LVEF 50 to 55% with normally functioning TAVR prosthesis, mean gradient 12 mmHg and trivial aortic  regurgitation.  He continues on aspirin.  Follow-up echocardiogram in September of this year.  2.  Mild CAD by cardiac catheterization in August 2022.  No active symptoms.  Continue statin therapy.  3.  Essential hypertension.  4.  Hyperlipidemia, LDL 77 in January.  Currently on Lipitor 10 mg daily.  Plan to increase to 20 mg daily and repeat FLP for next visit.  Disposition:  Follow up  6 months.  Signed, Jonelle Sidle, M.D., F.A.C.C. [facility]

## 2022-09-03 ENCOUNTER — Encounter: Payer: Self-pay | Admitting: Cardiology

## 2022-09-03 ENCOUNTER — Ambulatory Visit: Payer: Medicare Other | Attending: Cardiology | Admitting: Cardiology

## 2022-09-03 VITALS — BP 114/68 | HR 84 | Ht 73.5 in | Wt 228.6 lb

## 2022-09-03 DIAGNOSIS — E782 Mixed hyperlipidemia: Secondary | ICD-10-CM | POA: Diagnosis not present

## 2022-09-03 DIAGNOSIS — Z952 Presence of prosthetic heart valve: Secondary | ICD-10-CM

## 2022-09-03 DIAGNOSIS — Z79899 Other long term (current) drug therapy: Secondary | ICD-10-CM

## 2022-09-03 DIAGNOSIS — I2510 Atherosclerotic heart disease of native coronary artery without angina pectoris: Secondary | ICD-10-CM

## 2022-09-03 MED ORDER — ATORVASTATIN CALCIUM 20 MG PO TABS: 20.0000 mg | ORAL_TABLET | Freq: Every day | ORAL | 2 refills | Status: DC

## 2022-09-03 NOTE — Patient Instructions (Addendum)
Medication Instructions:  Your physician has recommended you make the following change in your medication:  Increase atorvastatin to 20 mg daily Continue other medications the same  Labwork: Your physician recommends that you return for a FASTING lipid profile: in September just before your next visit. Please do not eat or drink for at least 8 hours when you have this done. You may take your medications that morning with a sip of water.   Testing/Procedures: Your physician has requested that you have an echocardiogram in September 2024. Echocardiography is a painless test that uses sound waves to create images of your heart. It provides your doctor with information about the size and shape of your heart and how well your heart's chambers and valves are working. This procedure takes approximately one hour. There are no restrictions for this procedure. Please do NOT wear cologne, perfume, aftershave, or lotions (deodorant is allowed). Please arrive 15 minutes prior to your appointment time.  Follow-Up: Your physician recommends that you schedule a follow-up appointment in: September 2024.  Any Other Special Instructions Will Be Listed Below (If Applicable).  If you need a refill on your cardiac medications before your next appointment, please call your pharmacy.

## 2022-10-03 ENCOUNTER — Encounter: Payer: Self-pay | Admitting: Internal Medicine

## 2022-10-08 ENCOUNTER — Other Ambulatory Visit: Payer: Self-pay

## 2022-10-08 DIAGNOSIS — Z0000 Encounter for general adult medical examination without abnormal findings: Secondary | ICD-10-CM

## 2022-11-04 ENCOUNTER — Ambulatory Visit (INDEPENDENT_AMBULATORY_CARE_PROVIDER_SITE_OTHER): Payer: Medicare Other

## 2022-11-04 VITALS — Ht 73.5 in | Wt 222.0 lb

## 2022-11-04 DIAGNOSIS — Z0000 Encounter for general adult medical examination without abnormal findings: Secondary | ICD-10-CM

## 2022-11-04 NOTE — Patient Instructions (Addendum)
[name] , Thank you for taking time to come for your Medicare Wellness Visit. I appreciate your ongoing commitment to your health goals. Please review the following plan we discussed and let me know if I can assist you in the future.   These are the goals we discussed:  Goals       Get better at golf (pt-stated)      My goal is to lose 15 pounds, continue to watch my diet and stay physically active.        This is a list of the screening recommended for you and due dates:  Health Maintenance  Topic Date Due   COVID-19 Vaccine (4 - 2023-24 season) 11/20/2022*   Flu Shot  12/23/2022   Medicare Annual Wellness Visit  11/04/2023   Colon Cancer Screening  07/21/2026   DTaP/Tdap/Td vaccine (3 - Td or Tdap) 07/23/2029   Pneumonia Vaccine  Completed   Hepatitis C Screening  Completed   HPV Vaccine  Aged Out   Zoster (Shingles) Vaccine  Discontinued  *Topic was postponed. The date shown is not the original due date.    Advanced directives: Advance directive discussed with you today. Even though you declined this today, please call our office should you change your mind, and we can give you the proper paperwork for you to fill out.   Conditions/risks identified: None  Next appointment: Follow up in one year for your annual wellness visit.   Preventive Care 27 Years and Older, Male  Preventive care refers to lifestyle choices and visits with your health care provider that can promote health and wellness. What does preventive care include? A yearly physical exam. This is also called an annual well check. Dental exams once or twice a year. Routine eye exams. Ask your health care provider how often you should have your eyes checked. Personal lifestyle choices, including: Daily care of your teeth and gums. Regular physical activity. Eating a healthy diet. Avoiding tobacco and drug use. Limiting alcohol use. Practicing safe sex. Taking low doses of aspirin every day. Taking vitamin  and mineral supplements as recommended by your health care provider. What happens during an annual well check? The services and screenings done by your health care provider during your annual well check will depend on your age, overall health, lifestyle risk factors, and family history of disease. Counseling  Your health care provider may ask you questions about your: Alcohol use. Tobacco use. Drug use. Emotional well-being. Home and relationship well-being. Sexual activity. Eating habits. History of falls. Memory and ability to understand (cognition). Work and work Astronomer. Screening  You may have the following tests or measurements: Height, weight, and BMI. Blood pressure. Lipid and cholesterol levels. These may be checked every 5 years, or more frequently if you are over 81 years old. Skin check. Lung cancer screening. You may have this screening every year starting at age 67 if you have a 30-pack-year history of smoking and currently smoke or have quit within the past 15 years. Fecal occult blood test (FOBT) of the stool. You may have this test every year starting at age 24. Flexible sigmoidoscopy or colonoscopy. You may have a sigmoidoscopy every 5 years or a colonoscopy every 10 years starting at age 32. Prostate cancer screening. Recommendations will vary depending on your family history and other risks. Hepatitis C blood test. Hepatitis B blood test. Sexually transmitted disease (STD) testing. Diabetes screening. This is done by checking your blood sugar (glucose) after you have not  eaten for a while (fasting). You may have this done every 1-3 years. Abdominal aortic aneurysm (AAA) screening. You may need this if you are a current or former smoker. Osteoporosis. You may be screened starting at age 47 if you are at high risk. Talk with your health care provider about your test results, treatment options, and if necessary, the need for more tests. Vaccines  Your health care  provider may recommend certain vaccines, such as: Influenza vaccine. This is recommended every year. Tetanus, diphtheria, and acellular pertussis (Tdap, Td) vaccine. You may need a Td booster every 10 years. Zoster vaccine. You may need this after age 100. Pneumococcal 13-valent conjugate (PCV13) vaccine. One dose is recommended after age 76. Pneumococcal polysaccharide (PPSV23) vaccine. One dose is recommended after age 81. Talk to your health care provider about which screenings and vaccines you need and how often you need them. This information is not intended to replace advice given to you by your health care provider. Make sure you discuss any questions you have with your health care provider. Document Released: 06/06/2015 Document Revised: 01/28/2016 Document Reviewed: 03/11/2015 Elsevier Interactive Patient Education  2017 [place] Prevention in the Home Falls can cause injuries. They can happen to people of all ages. There are many things you can do to make your home safe and to help prevent falls. What can I do on the outside of my home? Regularly fix the edges of walkways and driveways and fix any cracks. Remove anything that might make you trip as you walk through a door, such as a raised step or threshold. Trim any bushes or trees on the path to your home. Use bright outdoor lighting. Clear any walking paths of anything that might make someone trip, such as rocks or tools. Regularly check to see if handrails are loose or broken. Make sure that both sides of any steps have handrails. Any raised decks and porches should have guardrails on the edges. Have any leaves, snow, or ice cleared regularly. Use sand or salt on walking paths during winter. Clean up any spills in your garage right away. This includes oil or grease spills. What can I do in the bathroom? Use night lights. Install grab bars by the toilet and in the tub and shower. Do not use towel bars as grab  bars. Use non-skid mats or decals in the tub or shower. If you need to sit down in the shower, use a plastic, non-slip stool. Keep the floor dry. Clean up any water that spills on the floor as soon as it happens. Remove soap buildup in the tub or shower regularly. Attach bath mats securely with double-sided non-slip rug tape. Do not have throw rugs and other things on the floor that can make you trip. What can I do in the bedroom? Use night lights. Make sure that you have a light by your bed that is easy to reach. Do not use any sheets or blankets that are too big for your bed. They should not hang down onto the floor. Have a firm chair that has side arms. You can use this for support while you get dressed. Do not have throw rugs and other things on the floor that can make you trip. What can I do in the kitchen? Clean up any spills right away. Avoid walking on wet floors. Keep items that you use a lot in easy-to-reach places. If you need to reach something above you, use a strong step stool that  has a grab bar. Keep electrical cords out of the way. Do not use floor polish or wax that makes floors slippery. If you must use wax, use non-skid floor wax. Do not have throw rugs and other things on the floor that can make you trip. What can I do with my stairs? Do not leave any items on the stairs. Make sure that there are handrails on both sides of the stairs and use them. Fix handrails that are broken or loose. Make sure that handrails are as long as the stairways. Check any carpeting to make sure that it is firmly attached to the stairs. Fix any carpet that is loose or worn. Avoid having throw rugs at the top or bottom of the stairs. If you do have throw rugs, attach them to the floor with carpet tape. Make sure that you have a light switch at the top of the stairs and the bottom of the stairs. If you do not have them, ask someone to add them for you. What else can I do to help prevent  falls? Wear shoes that: Do not have high heels. Have rubber bottoms. Are comfortable and fit you well. Are closed at the toe. Do not wear sandals. If you use a stepladder: Make sure that it is fully opened. Do not climb a closed stepladder. Make sure that both sides of the stepladder are locked into place. Ask someone to hold it for you, if possible. Clearly mark and make sure that you can see: Any grab bars or handrails. First and last steps. Where the edge of each step is. Use tools that help you move around (mobility aids) if they are needed. These include: Canes. Walkers. Scooters. Crutches. Turn on the lights when you go into a dark area. Replace any light bulbs as soon as they burn out. Set up your furniture so you have a clear path. Avoid moving your furniture around. If any of your floors are uneven, fix them. If there are any pets around you, be aware of where they are. Review your medicines with your doctor. Some medicines can make you feel dizzy. This can increase your chance of falling. Ask your doctor what other things that you can do to help prevent falls. This information is not intended to replace advice given to you by your health care provider. Make sure you discuss any questions you have with your health care provider. Document Released: 03/06/2009 Document Revised: 10/16/2015 Document Reviewed: 06/14/2014 Elsevier Interactive Patient Education  2017 [employer].

## 2022-11-04 NOTE — Progress Notes (Signed)
Subjective:   Danny Johnson is a 72 y.o. male who presents for Medicare Annual/Subsequent preventive examination.  Review of Systems    Virtual Visit via Telephone Note  I connected with  Deboraha Sprang on 11/04/22 at  2:00 PM EDT by telephone and verified that I am speaking with the correct person using two identifiers.  Location: Patient: Home Provider: Office Persons participating in the virtual visit: patient/Nurse Health Advisor   I discussed the limitations, risks, security and privacy concerns of performing an evaluation and management service by telephone and the availability of in person appointments. The patient expressed understanding and agreed to proceed.  Interactive audio and video telecommunications were attempted between this nurse and patient, however failed, due to patient having technical difficulties OR patient did not have access to video capability.  We continued and completed visit with audio only.  Some vital signs may be absent or patient reported.   Tillie Rung, LPN  Cardiac Risk Factors include: advanced age (>79men, >35 women);male gender;hypertension     Objective:    Today's Vitals   11/04/22 1416  Weight: 222 lb (100.7 kg)  Height: 6' 1.5" (1.867 m)   Body mass index is 28.89 kg/m.     11/04/2022    2:24 PM 09/02/2021    2:03 PM 02/03/2021    2:34 PM 01/30/2021    3:06 PM 01/28/2021   11:35 AM 01/12/2021    7:49 AM  Advanced Directives  Does Patient Have a Medical Advance Directive? No No No No No No  Would patient like information on creating a medical advance directive? No - Patient declined No - Patient declined No - Patient declined No - Patient declined Yes (MAU/Ambulatory/Procedural Areas - Information given) Yes (MAU/Ambulatory/Procedural Areas - Information given)    Current Medications (verified) Outpatient Encounter Medications as of 11/04/2022  Medication Sig   albuterol (VENTOLIN HFA) 108 (90 Base) MCG/ACT inhaler INHALE TWO  PUFFS BY MOUTH INTO THE LUNGS EVERY SIX HOURS AS NEEDED FOR WHEEZING OR SHORTNESS OF BREATH   amoxicillin (AMOXIL) 500 MG tablet Take 4 tablets by mouth 1 hour before dental procedures and cleanings   aspirin EC 81 MG tablet Take 1 tablet (81 mg total) by mouth every other day.   atorvastatin (LIPITOR) 20 MG tablet Take 1 tablet (20 mg total) by mouth daily.   fluticasone-salmeterol (WIXELA INHUB) 250-50 MCG/ACT AEPB Inhale 1 puff into the lungs as needed.   lisinopril (ZESTRIL) 20 MG tablet TAKE ONE TABLET BY MOUTH DAILY   Multiple Vitamin (MULTIVITAMIN WITH MINERALS) TABS tablet Take 1 tablet by mouth daily.   sildenafil (REVATIO) 20 MG tablet TAKE 3-5 TABLETS BY MOUTH DAILY AS NEEDED   venlafaxine XR (EFFEXOR-XR) 75 MG 24 hr capsule TAKE ONE CAPSULE BY MOUTH DAILY WITH BREAKFAST   No facility-administered encounter medications on file as of 11/04/2022.    Allergies (verified) Dust mite extract, Other, and Symbicort [budesonide-formoterol fumarate]   History: Past Medical History:  Diagnosis Date   Anxiety    Arthritis    Asthma    Bicuspid aortic valve    Coronary atherosclerosis of native coronary artery    Minimal, LVEF 60%   Depression    Essential hypertension, benign    History of hiatal hernia 12/2020   Melanoma (HCC) 1993   Mixed hyperlipidemia    PVC's (premature ventricular contractions)    S/P TAVR (transcatheter aortic valve replacement) 02/03/2021   29mm Edwards S3 via the TF approach by Dr.  McAlhnay and Dr. Laneta Simmers   Sleep apnea    Past Surgical History:  Procedure Laterality Date   HERNIA REPAIR  12/2017   INTRAOPERATIVE TRANSTHORACIC ECHOCARDIOGRAM Left 02/03/2021   Procedure: INTRAOPERATIVE TRANSTHORACIC ECHOCARDIOGRAM;  Surgeon: Kathleene Hazel, MD;  Location: [facility] OR;  Service: Open Heart Surgery;  Laterality: Left;   RIGHT/LEFT HEART CATH AND CORONARY ANGIOGRAPHY N/A 01/12/2021   Procedure: RIGHT/LEFT HEART CATH AND CORONARY ANGIOGRAPHY;  Surgeon:  Kathleene Hazel, MD;  Location: [facility] INVASIVE CV LAB;  Service: Cardiovascular;  Laterality: N/A;   TONSILLECTOMY     TRANSCATHETER AORTIC VALVE REPLACEMENT, TRANSFEMORAL N/A 02/03/2021   Procedure: TRANSCATHETER AORTIC VALVE REPLACEMENT, TRANSFEMORAL;  Surgeon: Kathleene Hazel, MD;  Location: [facility] OR;  Service: Open Heart Surgery;  Laterality: N/A;   ULTRASOUND GUIDANCE FOR VASCULAR ACCESS Bilateral 02/03/2021   Procedure: ULTRASOUND GUIDANCE FOR VASCULAR ACCESS;  Surgeon: Kathleene Hazel, MD;  Location: [facility] OR;  Service: Open Heart Surgery;  Laterality: Bilateral;   Family History  Problem Relation Age of Onset   Dementia Mother    Heart Problems Mother    Heart Problems Father    Social History   Socioeconomic History   Marital status: Divorced    Spouse name: Not on file   Number of children: 3   Years of education: Not on file   Highest education level: Not on file  Occupational History   Occupation: Retired-Safety and Environmental Compliance  Tobacco Use   Smoking status: Every Day    Packs/day: 0.50    Years: 10.00    Additional pack years: 0.00    Total pack years: 5.00    Types: Cigarettes    Start date: 07/21/2001   Smokeless tobacco: Never   Tobacco comments:    quit 2019  Vaping Use   Vaping Use: Never used  Substance and Sexual Activity   Alcohol use: Yes    Alcohol/week: 46.0 standard drinks of alcohol    Types: 4 Shots of liquor, 42 Standard drinks or equivalent per week    Comment: 4-6 drinks daily, beer/wine/scotch   Drug use: No   Sexual activity: Not on file  Other Topics Concern   Not on file  Social History Narrative   Not on file   Social Determinants of Health   Financial Resource Strain: Low Risk  (11/04/2022)   Overall Financial Resource Strain (CARDIA)    Difficulty of Paying Living Expenses: Not hard at all  Food Insecurity: No Food Insecurity (11/04/2022)   Hunger Vital Sign    Worried About Running Out of Food in the  Last Year: Never true    Ran Out of Food in the Last Year: Never true  Transportation Needs: No Transportation Needs (11/04/2022)   PRAPARE - Administrator, Civil Service (Medical): No    Lack of Transportation (Non-Medical): No  Physical Activity: Sufficiently Active (11/04/2022)   Exercise Vital Sign    Days of Exercise per Week: 3 days    Minutes of Exercise per Session: 150+ min  Stress: No Stress Concern Present (11/04/2022)   [employer] of Occupational Health - Occupational Stress Questionnaire    Feeling of Stress : Not at all  Social Connections: Moderately Integrated (11/04/2022)   Social Connection and Isolation Panel [NHANES]    Frequency of Communication with Friends and Family: More than three times a week    Frequency of Social Gatherings with Friends and Family: More than three times a week    Attends  Religious Services: More than 4 times per year    Active Member of Clubs or Organizations: Yes    Attends Banker Meetings: More than 4 times per year    Marital Status: Divorced    Tobacco Counseling Ready to quit: Not Answered Counseling given: Not Answered Tobacco comments: quit 2019   Clinical Intake:  Pre-visit preparation completed: Yes  Pain : No/denies pain     BMI - recorded: 28.89 Nutritional Status: BMI 25 -29 Overweight Nutritional Risks: None Diabetes: No  How often do you need to have someone help you when you read instructions, pamphlets, or other written materials from your doctor or pharmacy?: 1 - Never  Diabetic?  No  Interpreter Needed?: No  Information entered by :: Theresa Mulligan LPN   Activities of Daily Living    11/04/2022    2:23 PM 11/02/2022   11:00 AM  In your present state of health, do you have any difficulty performing the following activities:  Hearing? 0 0  Vision? 0 0  Difficulty concentrating or making decisions? 0 0  Walking or climbing stairs? 0 0  Dressing or bathing? 0 0  Doing  errands, shopping? 0 0  Preparing Food and eating ? N N  Using the Toilet? N N  In the past six months, have you accidently leaked urine? N N  Do you have problems with loss of bowel control? N N  Managing your Medications? N N  Managing your Finances? N N  Housekeeping or managing your Housekeeping? N N    Patient Care Team: Pincus Sanes, MD as PCP - General (Internal Medicine) Jonelle Sidle, MD as PCP - Cardiology (Cardiology) Nita Sells, MD (Dermatology) Szabat, Vinnie Level, [facility] (Inactive) as Pharmacist (Pharmacist)  Indicate any recent Medical Services you may have received from other than [facility] providers in the past year (date may be approximate).     Assessment:   This is a routine wellness examination for Urian.  Hearing/Vision screen Hearing Screening - Comments:: Denies hearing difficulties   Vision Screening - Comments:: Wears rx glasses - up to date with routine eye exams with [facility]   Dietary issues and exercise activities discussed: Current Exercise Habits: Home exercise routine, Type of exercise: walking, Time (Minutes): > 60, Frequency (Times/Week): 3, Weekly Exercise (Minutes/Week): 0, Intensity: Moderate, Exercise limited by: None identified   Goals Addressed               This Visit's Progress     Get better at golf (pt-stated)         Depression Screen    11/04/2022    2:22 PM 06/16/2022    9:56 AM 06/16/2022    9:55 AM 12/14/2021    1:05 PM 09/02/2021    2:07 PM 10/01/2020    4:35 PM 11/21/2018    1:37 PM  PHQ 2/9 Scores  PHQ - 2 Score 0 0 0 0 0 6 2  PHQ- 9 Score  2  0  20 6    Fall Risk    11/04/2022    2:24 PM 11/02/2022   11:00 AM 06/16/2022    9:55 AM 12/14/2021    1:05 PM 09/02/2021    2:04 PM  Fall Risk   Falls in the past year? 0 0 0 0 1  Number falls in past yr: 0 0 0 0 0  Injury with Fall? 0 0 0 0 1  Risk for fall due to : No Fall Risks  No Fall Risks No Fall Risks   Follow up Falls prevention discussed  Falls evaluation  completed Falls evaluation completed     FALL RISK PREVENTION PERTAINING TO THE HOME:  Any stairs in or around the home? Yes  If so, are there any without handrails? No  Home free of loose throw rugs in walkways, pet beds, electrical cords, etc? Yes  Adequate lighting in your home to reduce risk of falls? Yes   ASSISTIVE DEVICES UTILIZED TO PREVENT FALLS:  Life alert? No  Use of a cane, walker or w/c? No  Grab bars in the bathroom? Yes Shower chair or bench in shower? No  Elevated toilet seat or a handicapped toilet? No   TIMED UP AND GO:  Was the test performed? No . Audio Visit   Cognitive Function:        11/04/2022    2:25 PM 09/02/2021    2:17 PM  6CIT Screen  What Year? 0 points 0 points  What month? 0 points 0 points  What time? 0 points 0 points  Count back from 20 0 points 0 points  Months in reverse 0 points 0 points  Repeat phrase 0 points 0 points  Total Score 0 points 0 points    Immunizations Immunization History  Administered Date(s) Administered   Hepatitis B 11/05/1989   Influenza Split 03/25/2019   Influenza Whole 03/18/2008, 06/19/2009   Influenza, High Dose Seasonal PF 02/01/2020   Influenza, Seasonal, Injecte, Preservative Fre 07/05/2012   Influenza-Unspecified 02/28/2017, 01/22/2018, 02/05/2022   PFIZER(Purple Top)SARS-COV-2 Vaccination 06/13/2019, 07/04/2019, 03/07/2020   Pneumococcal Conjugate-13 08/26/2014   Pneumococcal Polysaccharide-23 04/11/2008, 10/03/2015   Pneumococcal-Unspecified 04/06/2019   Td 07/24/2019   Tdap 06/18/2011   Zoster, Live 08/21/2013    TDAP status: Up to dateTDAP status: Up to date  Flu Vaccine status: Up to date  Pneumococcal vaccine status: Up to date  Covid-19 vaccine status: Completed vaccines    Screening Tests Health Maintenance  Topic Date Due   COVID-19 Vaccine (4 - 2023-24 season) 11/20/2022 (Originally 01/22/2022)   INFLUENZA VACCINE  12/23/2022   Medicare Annual Wellness (AWV)  11/04/2023    Colonoscopy  07/21/2026   DTaP/Tdap/Td (3 - Td or Tdap) 07/23/2029   Pneumonia Vaccine 16+ Years old  Completed   Hepatitis C Screening  Completed   HPV VACCINES  Aged Out   Zoster Vaccines- Shingrix  Discontinued    Health Maintenance  There are no preventive care reminders to display for this patient.   Colorectal cancer screening: Type of screening: Colonoscopy. Completed 07/21/16. Repeat every 10 years  Lung Cancer Screening: (Low Dose CT Chest recommended if Age 58-80 years, 30 pack-year currently smoking OR have quit w/in 15years.) does not qualify.    Additional Screening:  Hepatitis C Screening: does qualify; Completed 10/06/15  Vision Screening: Recommended annual ophthalmology exams for early detection of glaucoma and other disorders of the eye. Is the patient up to date with their annual eye exam?  Yes  Who is the provider or what is the name of the office in which the patient attends annual eye exams? [facility] If pt is not established with a provider, would they like to be referred to a provider to establish care? No .   Dental Screening: Recommended annual dental exams for proper oral hygiene  Community Resource Referral / Chronic Care Management:  CRR required this visit?  No   CCM required this visit?  No      Plan:  I have personally reviewed and noted the following in the patient's chart:   Medical and social history Use of alcohol, tobacco or illicit drugs  Current medications and supplements including opioid prescriptions. Patient is not currently taking opioid prescriptions. Functional ability and status Nutritional status Physical activity Advanced directives List of other physicians Hospitalizations, surgeries, and ER visits in previous 12 months Vitals Screenings to include cognitive, depression, and falls Referrals and appointments  In addition, I have reviewed and discussed with patient certain preventive protocols, quality  metrics, and best practice recommendations. A written personalized care plan for preventive services as well as general preventive health recommendations were provided to patient.     Tillie Rung, LPN   1/61/0960   Nurse Notes: None

## 2022-11-09 ENCOUNTER — Other Ambulatory Visit (INDEPENDENT_AMBULATORY_CARE_PROVIDER_SITE_OTHER): Payer: Medicare Other

## 2022-11-09 DIAGNOSIS — Z0000 Encounter for general adult medical examination without abnormal findings: Secondary | ICD-10-CM | POA: Diagnosis not present

## 2022-11-09 LAB — PSA, MEDICARE: PSA: 0.61 ng/ml (ref 0.10–4.00)

## 2022-12-06 ENCOUNTER — Encounter: Payer: Self-pay | Admitting: Internal Medicine

## 2022-12-08 ENCOUNTER — Encounter: Payer: Self-pay | Admitting: Internal Medicine

## 2022-12-08 DIAGNOSIS — K4090 Unilateral inguinal hernia, without obstruction or gangrene, not specified as recurrent: Secondary | ICD-10-CM | POA: Diagnosis not present

## 2022-12-08 DIAGNOSIS — Z1330 Encounter for screening examination for mental health and behavioral disorders, unspecified: Secondary | ICD-10-CM | POA: Diagnosis not present

## 2022-12-21 ENCOUNTER — Encounter: Payer: Self-pay | Admitting: Internal Medicine

## 2022-12-22 ENCOUNTER — Encounter (INDEPENDENT_AMBULATORY_CARE_PROVIDER_SITE_OTHER): Payer: Self-pay

## 2023-01-01 ENCOUNTER — Encounter: Payer: Self-pay | Admitting: Internal Medicine

## 2023-01-01 DIAGNOSIS — Z125 Encounter for screening for malignant neoplasm of prostate: Secondary | ICD-10-CM

## 2023-01-01 DIAGNOSIS — R7303 Prediabetes: Secondary | ICD-10-CM

## 2023-01-03 ENCOUNTER — Encounter: Payer: Self-pay | Admitting: Internal Medicine

## 2023-01-03 ENCOUNTER — Other Ambulatory Visit: Payer: Self-pay

## 2023-01-03 DIAGNOSIS — Z0000 Encounter for general adult medical examination without abnormal findings: Secondary | ICD-10-CM

## 2023-01-03 DIAGNOSIS — I10 Essential (primary) hypertension: Secondary | ICD-10-CM

## 2023-01-03 DIAGNOSIS — F32A Depression, unspecified: Secondary | ICD-10-CM

## 2023-01-03 DIAGNOSIS — Z125 Encounter for screening for malignant neoplasm of prostate: Secondary | ICD-10-CM

## 2023-01-03 DIAGNOSIS — R7303 Prediabetes: Secondary | ICD-10-CM

## 2023-01-03 DIAGNOSIS — E782 Mixed hyperlipidemia: Secondary | ICD-10-CM

## 2023-01-03 NOTE — Progress Notes (Unsigned)
Subjective:    Patient ID: Danny Johnson, male    DOB: Feb 01, 1951, 72 y.o.   MRN: [id number]     HPI Jerl is here for a physical exam and his chronic medical problems.   Family stresses.  Feels shaky, nervous.  Probably a little depressed - lots of friends battling cancer.   Playing a lot of golf in high heat.  Drinking lots of water  Having diarrhea Q am  Has cut back on drinking a good amount.  Drinks a couple of drinks a day.   Medications and allergies reviewed with patient and updated if appropriate.  Current Outpatient Medications on File Prior to Visit  Medication Sig Dispense Refill   albuterol (VENTOLIN HFA) 108 (90 Base) MCG/ACT inhaler INHALE TWO PUFFS BY MOUTH INTO THE LUNGS EVERY SIX HOURS AS NEEDED FOR WHEEZING OR SHORTNESS OF BREATH 54 g 2   amoxicillin (AMOXIL) 500 MG tablet Take 4 tablets by mouth 1 hour before dental procedures and cleanings 12 tablet 6   aspirin EC 81 MG tablet Take 1 tablet (81 mg total) by mouth every other day. 90 tablet 3   atorvastatin (LIPITOR) 20 MG tablet Take 1 tablet (20 mg total) by mouth daily. 90 tablet 2   fluticasone-salmeterol (WIXELA INHUB) 250-50 MCG/ACT AEPB Inhale 1 puff into the lungs as needed.     lisinopril (ZESTRIL) 20 MG tablet TAKE ONE TABLET BY MOUTH DAILY 90 tablet 3   Multiple Vitamin (MULTIVITAMIN WITH MINERALS) TABS tablet Take 1 tablet by mouth daily.     sildenafil (REVATIO) 20 MG tablet TAKE 3-5 TABLETS BY MOUTH DAILY AS NEEDED 60 tablet 2   venlafaxine XR (EFFEXOR-XR) 75 MG 24 hr capsule TAKE ONE CAPSULE BY MOUTH DAILY WITH BREAKFAST 90 capsule 0   No current facility-administered medications on file prior to visit.    Review of Systems  Constitutional:  Positive for appetite change (less than usual) and diaphoresis. Negative for fever.  Eyes:  Negative for visual disturbance.  Respiratory:  Negative for cough, shortness of breath and wheezing.   Cardiovascular:  Negative for chest pain, palpitations  and leg swelling.  Gastrointestinal:  Positive for diarrhea. Negative for abdominal pain, blood in stool and constipation.       No gerd  Genitourinary:  Positive for frequency. Negative for difficulty urinating and dysuria.  Musculoskeletal:  Negative for arthralgias and back pain.  Skin:  Negative for rash.  Neurological:  Positive for tremors (MGF - parkinsons) and light-headedness (at times with bending over and standing up). Negative for headaches.  Psychiatric/Behavioral:  Positive for dysphoric mood. The patient is nervous/anxious.        Objective:   Vitals:   01/04/23 1306  BP: 116/80  Pulse: 77  Temp: 98.8 F (37.1 C)  SpO2: 93%   Filed Weights   01/04/23 1306  Weight: 227 lb (103 kg)   Body mass index is 29.54 kg/m.  BP Readings from Last 3 Encounters:  01/04/23 116/80  09/03/22 114/68  06/16/22 126/80    Wt Readings from Last 3 Encounters:  01/04/23 227 lb (103 kg)  11/04/22 222 lb (100.7 kg)  09/03/22 228 lb 9.6 oz (103.7 kg)      Physical Exam Constitutional: He appears well-developed and well-nourished. No distress.  HENT:  Head: Normocephalic and atraumatic.  Right Ear: External ear normal.  Left Ear: External ear normal.  Normal ear canals and TM b/l  Mouth/Throat: Oropharynx is clear and moist. Eyes: Conjunctivae and  EOM are normal.  Neck: Neck supple. No tracheal deviation present. No thyromegaly present.  No carotid bruit  Cardiovascular: Normal rate, regular rhythm, normal heart sounds and intact distal pulses.   2/6 sys murmur heard.  No lower extremity edema. Pulmonary/Chest: Effort normal and breath sounds normal. No respiratory distress. He has no wheezes. He has no rales.  Abdominal: Soft. He exhibits no distension. There is no tenderness.  Genitourinary: deferred  Lymphadenopathy:   He has no cervical adenopathy.  Skin: Skin is warm and dry. He is not diaphoretic.  Psychiatric: He has a normal mood and affect. His behavior is  normal.         Assessment & Plan:   Physical exam: Screening blood work  ordered - reviewed Exercise   regular - golf, yard work on farm [employer]  ok for age Substance abuse   none   Reviewed recommended immunizations.   Health Maintenance  Topic Date Due   COVID-19 Vaccine (4 - 2023-24 season) 01/22/2022   INFLUENZA VACCINE  08/22/2023 (Originally 12/23/2022)   Medicare Annual Wellness (AWV)  11/04/2023   Colonoscopy  07/21/2026   DTaP/Tdap/Td (3 - Td or Tdap) 07/23/2029   Pneumonia Vaccine 30+ Years old  Completed   Hepatitis C Screening  Completed   HPV VACCINES  Aged Out   Zoster Vaccines- Shingrix  Discontinued     See Problem List for Assessment and Plan of chronic medical problems.

## 2023-01-03 NOTE — Patient Instructions (Addendum)
Medications changes include :  decrease effexor to 37.5 mg daily for one week then every other day for one week then stop. Start lexapro 10 mg daily.        Return in about 6 weeks (around 02/15/2023) for depression, anxiety.    Health Maintenance, Male Adopting a healthy lifestyle and getting preventive care are important in promoting health and wellness. Ask your health care provider about: The right schedule for you to have regular tests and exams. Things you can do on your own to prevent diseases and keep yourself healthy. What should I know about diet, weight, and exercise? Eat a healthy diet  Eat a diet that includes plenty of vegetables, fruits, low-fat dairy products, and lean protein. Do not eat a lot of foods that are high in solid fats, added sugars, or sodium. Maintain a healthy weight Body mass index (BMI) is a measurement that can be used to identify possible weight problems. It estimates body fat based on height and weight. Your health care provider can help determine your BMI and help you achieve or maintain a healthy weight. Get regular exercise Get regular exercise. This is one of the most important things you can do for your health. Most adults should: Exercise for at least 150 minutes each week. The exercise should increase your heart rate and make you sweat (moderate-intensity exercise). Do strengthening exercises at least twice a week. This is in addition to the moderate-intensity exercise. Spend less time sitting. Even light physical activity can be beneficial. Watch cholesterol and blood lipids Have your blood tested for lipids and cholesterol at 74 years of age, then have this test every 5 years. You may need to have your cholesterol levels checked more often if: Your lipid or cholesterol levels are high. You are older than 74 years of age. You are at high risk for heart disease. What should I know about cancer screening? Many types of cancers can be  detected early and may often be prevented. Depending on your health history and family history, you may need to have cancer screening at various ages. This may include screening for: Colorectal cancer. Prostate cancer. Skin cancer. Lung cancer. What should I know about heart disease, diabetes, and high blood pressure? Blood pressure and heart disease High blood pressure causes heart disease and increases the risk of stroke. This is more likely to develop in people who have high blood pressure readings or are overweight. Talk with your health care provider about your target blood pressure readings. Have your blood pressure checked: Every 3-5 years if you are 21-6 years of age. Every year if you are 36 years old or older. If you are between the ages of 44 and 26 and are a current or former smoker, ask your health care provider if you should have a one-time screening for abdominal aortic aneurysm (AAA). Diabetes Have regular diabetes screenings. This checks your fasting blood sugar level. Have the screening done: Once every three years after age 48 if you are at a normal weight and have a low risk for diabetes. More often and at a younger age if you are overweight or have a high risk for diabetes. What should I know about preventing infection? Hepatitis B If you have a higher risk for hepatitis B, you should be screened for this virus. Talk with your health care provider to find out if you are at risk for hepatitis B infection. Hepatitis C Blood testing is recommended for: Everyone  born from [birth year] through 1967. Anyone with known risk factors for hepatitis C. Sexually transmitted infections (STIs) You should be screened each year for STIs, including gonorrhea and chlamydia, if: You are sexually active and are younger than 74 years of age. You are older than 74 years of age and your health care provider tells you that you are at risk for this type of infection. Your sexual activity has changed  since you were last screened, and you are at increased risk for chlamydia or gonorrhea. Ask your health care provider if you are at risk. Ask your health care provider about whether you are at high risk for HIV. Your health care provider may recommend a prescription medicine to help prevent HIV infection. If you choose to take medicine to prevent HIV, you should first get tested for HIV. You should then be tested every 3 months for as long as you are taking the medicine. Follow these instructions at home: Alcohol use Do not drink alcohol if your health care provider tells you not to drink. If you drink alcohol: Limit how much you have to 0-2 drinks a day. Know how much alcohol is in your drink. In the [place] one drink equals one 12 oz bottle of beer (355 mL), one 5 oz glass of wine (148 mL), or one 1 oz glass of hard liquor (44 mL). Lifestyle Do not use any products that contain nicotine or tobacco. These products include cigarettes, chewing tobacco, and vaping devices, such as e-cigarettes. If you need help quitting, ask your health care provider. Do not use street drugs. Do not share needles. Ask your health care provider for help if you need support or information about quitting drugs. General instructions Schedule regular health, dental, and eye exams. Stay current with your vaccines. Tell your health care provider if: You often feel depressed. You have ever been abused or do not feel safe at home. Summary Adopting a healthy lifestyle and getting preventive care are important in promoting health and wellness. Follow your health care provider's instructions about healthy diet, exercising, and getting tested or screened for diseases. Follow your health care provider's instructions on monitoring your cholesterol and blood pressure. This information is not intended to replace advice given to you by your health care provider. Make sure you discuss any questions you have with your health care  provider. Document Revised: 09/29/2020 Document Reviewed: 09/29/2020 Elsevier Patient Education  2024 [employer].

## 2023-01-04 ENCOUNTER — Other Ambulatory Visit (INDEPENDENT_AMBULATORY_CARE_PROVIDER_SITE_OTHER): Payer: Medicare Other

## 2023-01-04 ENCOUNTER — Ambulatory Visit (INDEPENDENT_AMBULATORY_CARE_PROVIDER_SITE_OTHER): Payer: Medicare Other | Admitting: Internal Medicine

## 2023-01-04 ENCOUNTER — Other Ambulatory Visit: Payer: Self-pay

## 2023-01-04 VITALS — BP 116/80 | HR 77 | Temp 98.8°F | Ht 73.5 in | Wt 227.0 lb

## 2023-01-04 DIAGNOSIS — Z125 Encounter for screening for malignant neoplasm of prostate: Secondary | ICD-10-CM | POA: Diagnosis not present

## 2023-01-04 DIAGNOSIS — F32A Depression, unspecified: Secondary | ICD-10-CM

## 2023-01-04 DIAGNOSIS — F419 Anxiety disorder, unspecified: Secondary | ICD-10-CM

## 2023-01-04 DIAGNOSIS — F1020 Alcohol dependence, uncomplicated: Secondary | ICD-10-CM

## 2023-01-04 DIAGNOSIS — Z0000 Encounter for general adult medical examination without abnormal findings: Secondary | ICD-10-CM

## 2023-01-04 DIAGNOSIS — E782 Mixed hyperlipidemia: Secondary | ICD-10-CM

## 2023-01-04 DIAGNOSIS — I10 Essential (primary) hypertension: Secondary | ICD-10-CM | POA: Diagnosis not present

## 2023-01-04 DIAGNOSIS — R7303 Prediabetes: Secondary | ICD-10-CM | POA: Diagnosis not present

## 2023-01-04 DIAGNOSIS — G4733 Obstructive sleep apnea (adult) (pediatric): Secondary | ICD-10-CM

## 2023-01-04 DIAGNOSIS — F17210 Nicotine dependence, cigarettes, uncomplicated: Secondary | ICD-10-CM

## 2023-01-04 DIAGNOSIS — F418 Other specified anxiety disorders: Secondary | ICD-10-CM

## 2023-01-04 DIAGNOSIS — J4530 Mild persistent asthma, uncomplicated: Secondary | ICD-10-CM

## 2023-01-04 LAB — URINALYSIS, ROUTINE W REFLEX MICROSCOPIC
Bilirubin Urine: NEGATIVE
Hgb urine dipstick: NEGATIVE
Ketones, ur: NEGATIVE
Leukocytes,Ua: NEGATIVE
Nitrite: NEGATIVE
RBC / HPF: NONE SEEN (ref 0–?)
Specific Gravity, Urine: >=1.03 — AB (ref 1.000–1.030)
Total Protein, Urine: NEGATIVE
Urine Glucose: NEGATIVE
Urobilinogen, UA: 0.2 (ref 0.0–1.0)
pH: 6 (ref 5.0–8.0)

## 2023-01-04 LAB — CBC WITH DIFFERENTIAL/PLATELET
Basophils Absolute: 0.1 10*3/uL (ref 0.0–0.1)
Basophils Relative: 1.2 % (ref 0.0–3.0)
Eosinophils Absolute: 0.4 10*3/uL (ref 0.0–0.7)
Eosinophils Relative: 7.9 % — ABNORMAL HIGH (ref 0.0–5.0)
HCT: 45.8 % (ref 39.0–52.0)
Hemoglobin: 15.3 g/dL (ref 13.0–17.0)
Lymphocytes Relative: 34.1 % (ref 12.0–46.0)
Lymphs Abs: 1.8 10*3/uL (ref 0.7–4.0)
MCHC: 33.4 g/dL (ref 30.0–36.0)
MCV: 104 fl — ABNORMAL HIGH (ref 78.0–100.0)
Monocytes Absolute: 0.7 10*3/uL (ref 0.1–1.0)
Monocytes Relative: 12.9 % — ABNORMAL HIGH (ref 3.0–12.0)
Neutro Abs: 2.3 10*3/uL (ref 1.4–7.7)
Neutrophils Relative %: 43.9 % (ref 43.0–77.0)
Platelets: 216 10*3/uL (ref 150.0–400.0)
RBC: 4.4 Mil/uL (ref 4.22–5.81)
RDW: 13.6 % (ref 11.5–15.5)
WBC: 5.3 10*3/uL (ref 4.0–10.5)

## 2023-01-04 LAB — COMPREHENSIVE METABOLIC PANEL
ALT: 31 U/L (ref 0–53)
AST: 28 U/L (ref 0–37)
Albumin: 4.5 g/dL (ref 3.5–5.2)
Alkaline Phosphatase: 79 U/L (ref 39–117)
BUN: 18 mg/dL (ref 6–23)
CO2: 29 mEq/L (ref 19–32)
Calcium: 9.3 mg/dL (ref 8.4–10.5)
Chloride: 103 mEq/L (ref 96–112)
Creatinine, Ser: 0.74 mg/dL (ref 0.40–1.50)
GFR: 89.59 mL/min (ref >60.00)
Glucose, Bld: 108 mg/dL — ABNORMAL HIGH (ref 70–99)
Potassium: 4.7 mEq/L (ref 3.5–5.1)
Sodium: 141 mEq/L (ref 135–145)
Total Bilirubin: 0.8 mg/dL (ref 0.2–1.2)
Total Protein: 6.7 g/dL (ref 6.0–8.3)

## 2023-01-04 LAB — HEMOGLOBIN A1C: Hgb A1c MFr Bld: 5.7 % (ref 4.6–6.5)

## 2023-01-04 LAB — PSA, MEDICARE: PSA: 0.59 ng/ml (ref 0.10–4.00)

## 2023-01-04 LAB — LIPID PANEL
Cholesterol: 166 mg/dL (ref 0–200)
HDL: 69.8 mg/dL (ref >39.00)
LDL Cholesterol: 78 mg/dL (ref 0–99)
NonHDL: 96.07
Total CHOL/HDL Ratio: 2
Triglycerides: 92 mg/dL (ref 0.0–149.0)
VLDL: 18.4 mg/dL (ref 0.0–40.0)

## 2023-01-04 LAB — TSH: TSH: 1.65 u[IU]/mL (ref 0.35–5.50)

## 2023-01-04 MED ORDER — ESCITALOPRAM OXALATE 10 MG PO TABS: 10.0000 mg | ORAL_TABLET | Freq: Every day | ORAL | 5 refills | Status: DC

## 2023-01-04 NOTE — Assessment & Plan Note (Signed)
Chronic Check lipid panel, CMP Continue atorvastatin 10 mg daily Regular exercise and healthy diet encouraged

## 2023-01-04 NOTE — Assessment & Plan Note (Signed)
Chronic Mild, persistent ?  Controlled-currently not using the Advair Advised using Advair twice daily-will change to a different type of inhaler which may be more affordable-Advair 250-50 mcg per ACT AEPB Continue albuterol prn

## 2023-01-04 NOTE — Assessment & Plan Note (Addendum)
States he was tested several years ago and was told he had mild sleep apnea Did not tolerate CPAP and is not currently using it Girlfriend says he does snore and has apnea in the middle of the night He states fatigue after getting a good amount of rest He may consider inspire Referral at last visit sent to ENT -- currently using nose strips which are helping and he will continue

## 2023-01-04 NOTE — Assessment & Plan Note (Signed)
He is actively smoking States he smokes 4-5 cigarettes a day Stressed smoking cessation

## 2023-01-04 NOTE — Assessment & Plan Note (Signed)
Chronic Check a1c Low sugar / carb diet Stressed regular exercise  

## 2023-01-04 NOTE — Assessment & Plan Note (Signed)
Chronic Has cut back  Drinks 2 drinks nightly

## 2023-01-04 NOTE — Assessment & Plan Note (Signed)
Chronic BP well controlled Continue lisinopril 20 mg daily cmp, CBC

## 2023-01-04 NOTE — Assessment & Plan Note (Addendum)
Chronic Not controlled Taper off effexor - go to 37.5 mg daily x 1 week, then every other day x 1 week then stop Then start lexapro 10 mg daily F/u in 6 weeks

## 2023-01-05 ENCOUNTER — Encounter: Payer: Self-pay | Admitting: Internal Medicine

## 2023-01-05 MED ORDER — VENLAFAXINE HCL ER 37.5 MG PO CP24: 37.5000 mg | ORAL_CAPSULE | Freq: Every day | ORAL | 0 refills | Status: DC

## 2023-01-06 ENCOUNTER — Encounter (INDEPENDENT_AMBULATORY_CARE_PROVIDER_SITE_OTHER): Payer: Self-pay

## 2023-01-16 ENCOUNTER — Other Ambulatory Visit: Payer: Self-pay | Admitting: Internal Medicine

## 2023-01-25 ENCOUNTER — Other Ambulatory Visit: Payer: Self-pay

## 2023-01-25 ENCOUNTER — Ambulatory Visit: Payer: Medicare Other | Attending: Cardiology

## 2023-01-25 DIAGNOSIS — Z952 Presence of prosthetic heart valve: Secondary | ICD-10-CM | POA: Diagnosis not present

## 2023-01-25 DIAGNOSIS — E782 Mixed hyperlipidemia: Secondary | ICD-10-CM | POA: Diagnosis not present

## 2023-01-25 DIAGNOSIS — Z79899 Other long term (current) drug therapy: Secondary | ICD-10-CM | POA: Diagnosis not present

## 2023-01-25 LAB — ECHOCARDIOGRAM COMPLETE
AR max vel: 1.26 cm2
AV Area VTI: 1.3 cm2
AV Area mean vel: 1.27 cm2
AV Mean grad: 11.7 mmHg
AV Peak grad: 20.7 mmHg
Ao pk vel: 2.28 m/s
Area-P 1/2: 3.6 cm2
Calc EF: 52.7 %
MV VTI: 2.21 cm2
S' Lateral: 2.3 cm
Single Plane A2C EF: 47.4 %
Single Plane A4C EF: 57 %

## 2023-01-27 ENCOUNTER — Encounter: Payer: Self-pay | Admitting: Internal Medicine

## 2023-02-11 ENCOUNTER — Ambulatory Visit: Payer: Medicare Other | Attending: Cardiology | Admitting: Cardiology

## 2023-02-11 VITALS — BP 136/74 | HR 65 | Ht 74.0 in | Wt 228.0 lb

## 2023-02-11 DIAGNOSIS — I2510 Atherosclerotic heart disease of native coronary artery without angina pectoris: Secondary | ICD-10-CM

## 2023-02-11 DIAGNOSIS — Z952 Presence of prosthetic heart valve: Secondary | ICD-10-CM

## 2023-02-11 DIAGNOSIS — I10 Essential (primary) hypertension: Secondary | ICD-10-CM | POA: Diagnosis not present

## 2023-02-11 DIAGNOSIS — E782 Mixed hyperlipidemia: Secondary | ICD-10-CM

## 2023-02-11 NOTE — Patient Instructions (Signed)
Medication Instructions:   Your physician recommends that you continue on your current medications as directed. Please refer to the Current Medication list given to you today.   Labwork: None today  Testing/Procedures: Your physician has requested that you have an echocardiogram in 1 year. Echocardiography is a painless test that uses sound waves to create images of your heart. It provides your doctor with information about the size and shape of your heart and how well your heart's chambers and valves are working. This procedure takes approximately one hour. There are no restrictions for this procedure. Please do NOT wear cologne, perfume, aftershave, or lotions (deodorant is allowed). Please arrive 15 minutes prior to your appointment time.   Follow-Up: 1 year with Dr.McDowell after Echo  Any Other Special Instructions Will Be Listed Below (If Applicable).  If you need a refill on your cardiac medications before your next appointment, please call your pharmacy.

## 2023-02-11 NOTE — Progress Notes (Signed)
Cardiology Office Note  Date: 02/11/2023   ID: ZACKREY SCHLICHER, DOB 06-25-1950, MRN [id number]  History of Present Illness: Danny Johnson is a 72 y.o. male last seen in April.  He is here for a routine visit.  Reports NYHA class II dyspnea, no exertional chest pain, no palpitations or syncope.  Still playing golf 3 days a week.  I reviewed his medications.  Continues on aspirin, Lipitor at 20 mg daily now, and lisinopril.  His most recent LDL had not changed much at 78.  We discussed his recent follow-up echocardiogram.  LVEF 50 to 55% with stable TAVR, mean AV gradient approximately 12 mmHg and no paravalvular regurgitation.  ECG today shows sinus rhythm with rightward axis.  Physical Exam: VS:  BP 136/74 (BP Location: Right Arm, Patient Position: Sitting, Cuff Size: Normal)   Pulse 65   Ht 6\' 2"  (1.88 m)   Wt 228 lb (103.4 kg)   SpO2 98%   BMI 29.27 kg/m , BMI Body mass index is 29.27 kg/m.  Wt Readings from Last 3 Encounters:  02/11/23 228 lb (103.4 kg)  01/04/23 227 lb (103 kg)  11/04/22 222 lb (100.7 kg)    General: Patient appears comfortable at rest. HEENT: Conjunctiva and lids normal. Neck: Supple, no elevated JVP or carotid bruits. Lungs: Clear to auscultation, nonlabored breathing at rest. Cardiac: Regular rate and rhythm, no S3, 1/6 systolic murmur. Extremities: No pitting edema.  ECG:  An ECG dated 02/03/2022 was personally reviewed today and demonstrated:  Sinus rhythm with leftward axis and PVC.  Labwork: 01/04/2023: ALT 31; AST 28; BUN 18; Creatinine, Ser 0.74; Hemoglobin 15.3; Platelets 216.0; Potassium 4.7; Sodium 141; TSH 1.65     Component Value Date/Time   CHOL 166 01/04/2023 1040   TRIG 92.0 01/04/2023 1040   TRIG 89 10/25/2008 0000   HDL 69.80 01/04/2023 1040   CHOLHDL 2 01/04/2023 1040   VLDL 18.4 01/04/2023 1040   LDLCALC 78 01/04/2023 1040   LDLDIRECT 153.1 06/29/2012 0929   Other Studies Reviewed Today:  Echocardiogram 01/25/2023:  1.  Left ventricular ejection fraction, by estimation, is 50 to 55%. The  left ventricle has low normal function. Left ventricular endocardial  border not optimally defined to evaluate regional wall motion. Left  ventricular diastolic parameters were  normal.   2. Right ventricular systolic function is normal. The right ventricular  size is normal. Tricuspid regurgitation signal is inadequate for assessing  PA pressure.   3. The mitral valve is abnormal, mildly calcified with mild anterior  leaflet prolapse. Trivial mitral valve regurgitation.   4. The aortic valve has been repaired/replaced. Aortic valve  regurgitation is not visualized. There is a 29 mm Sapien prosthetic (TAVR)  valve present in the aortic position. Procedure Date: 02/03/2021. Aortic  valve mean gradient measures 11.7 mmHg.   5. The inferior vena cava is normal in size with greater than 50%  respiratory variability, suggesting right atrial pressure of 3 mmHg.   Assessment and Plan:  1.  Bicuspid aortic valve with severe aortic stenosis status post TAVR with 29 mm Edwards SAPIEN 3 THV in September 2022.  Recent follow-up echocardiogram shows mean AV gradient approximately 12 mmHg and no paravalvular regurgitation.  He is symptomatically stable, NYHA class II dyspnea.  Continue aspirin.  Follow-up repeat echocardiogram in 1 year.   2.  Mild CAD by cardiac catheterization in August 2022.  LVEF 50 to 55% by recent follow-up echocardiogram.  Continue aspirin and Lipitor.  3.  Essential hypertension.  No change in lisinopril dosing for now.   4.  Hyperlipidemia, LDL 78 in August.  Tolerating Lipitor 20 mg daily which we will continue for now planning on reevaluation of lipids next year.  Did discuss up titration of dose if tolerated.  Disposition:  Follow up  1 year, sooner if needed.  Signed, Jonelle Sidle, M.D., F.A.C.C. [facility]

## 2023-02-14 NOTE — Progress Notes (Unsigned)
Subjective:    Patient ID: Danny Johnson, male    DOB: Jan 01, 1951, 72 y.o.   MRN: [id number]     HPI Amarian is here for follow up of his chronic medical problems.  6 weeks ago tapered him off effexor and started him on lexapro.  Did well with the transition.  He is taking the Lexapro daily as prescribed.  He does feel better.  He feels the dose is okay at this time.  He has cut back on drinking.  Energy better.  Appetite is good.    Medications and allergies reviewed with patient and updated if appropriate.  Current Outpatient Medications on File Prior to Visit  Medication Sig Dispense Refill   albuterol (VENTOLIN HFA) 108 (90 Base) MCG/ACT inhaler INHALE TWO PUFFS BY MOUTH INTO THE LUNGS EVERY SIX HOURS AS NEEDED FOR WHEEZING OR SHORTNESS OF BREATH 54 g 2   amoxicillin (AMOXIL) 500 MG tablet Take 4 tablets by mouth 1 hour before dental procedures and cleanings 12 tablet 6   aspirin EC 81 MG tablet Take 1 tablet (81 mg total) by mouth every other day. 90 tablet 3   atorvastatin (LIPITOR) 20 MG tablet Take 1 tablet (20 mg total) by mouth daily. 90 tablet 2   escitalopram (LEXAPRO) 10 MG tablet Take 1 tablet (10 mg total) by mouth daily. 30 tablet 5   fluticasone-salmeterol (WIXELA INHUB) 250-50 MCG/ACT AEPB Inhale 1 puff into the lungs as needed.     lisinopril (ZESTRIL) 20 MG tablet TAKE ONE TABLET BY MOUTH DAILY 90 tablet 3   Multiple Vitamin (MULTIVITAMIN WITH MINERALS) TABS tablet Take 1 tablet by mouth daily.     sildenafil (REVATIO) 20 MG tablet TAKE 3-5 TABLETS BY MOUTH DAILY AS NEEDED 60 tablet 2   No current facility-administered medications on file prior to visit.     Review of Systems  Constitutional:  Negative for appetite change.  Psychiatric/Behavioral:  Negative for sleep disturbance.        Objective:   Vitals:   02/15/23 1338  BP: 132/88  Pulse: 78  Temp: 98.2 F (36.8 C)  SpO2: 94%   BP Readings from Last 3 Encounters:  02/15/23 132/88   02/11/23 136/74  01/04/23 116/80   Wt Readings from Last 3 Encounters:  02/15/23 227 lb (103 kg)  02/11/23 228 lb (103.4 kg)  01/04/23 227 lb (103 kg)   Body mass index is 29.15 kg/m.    Physical Exam Constitutional:      General: He is not in acute distress.    Appearance: Normal appearance. He is not ill-appearing.  HENT:     Head: Normocephalic and atraumatic.  Skin:    General: Skin is warm and dry.  Neurological:     Mental Status: He is alert. Mental status is at baseline.  Psychiatric:        Mood and Affect: Mood normal.        Behavior: Behavior normal.        Thought Content: Thought content normal.        Judgment: Judgment normal.        Lab Results  Component Value Date   WBC 5.3 01/04/2023   HGB 15.3 01/04/2023   HCT 45.8 01/04/2023   PLT 216.0 01/04/2023   GLUCOSE 108 (H) 01/04/2023   CHOL 166 01/04/2023   TRIG 92.0 01/04/2023   HDL 69.80 01/04/2023   LDLDIRECT 153.1 06/29/2012   LDLCALC 78 01/04/2023   ALT 31  01/04/2023   AST 28 01/04/2023   NA 141 01/04/2023   K 4.7 01/04/2023   CL 103 01/04/2023   CREATININE 0.74 01/04/2023   BUN 18 01/04/2023   CO2 29 01/04/2023   TSH 1.65 01/04/2023   PSA 0.59 01/04/2023   INR 0.9 01/30/2021   HGBA1C 5.7 01/04/2023     Assessment & Plan:    See Problem List for Assessment and Plan of chronic medical problems.

## 2023-02-14 NOTE — Patient Instructions (Addendum)
       Medications changes include :   none      Return in about 6 months (around 08/15/2023) for follow up.

## 2023-02-15 ENCOUNTER — Encounter: Payer: Self-pay | Admitting: Internal Medicine

## 2023-02-15 ENCOUNTER — Ambulatory Visit: Payer: Medicare Other | Admitting: Internal Medicine

## 2023-02-15 VITALS — BP 132/88 | HR 78 | Temp 98.2°F | Ht 74.0 in | Wt 227.0 lb

## 2023-02-15 DIAGNOSIS — F419 Anxiety disorder, unspecified: Secondary | ICD-10-CM

## 2023-02-15 DIAGNOSIS — F1020 Alcohol dependence, uncomplicated: Secondary | ICD-10-CM

## 2023-02-15 DIAGNOSIS — F32A Depression, unspecified: Secondary | ICD-10-CM

## 2023-02-15 DIAGNOSIS — I10 Essential (primary) hypertension: Secondary | ICD-10-CM

## 2023-02-15 MED ORDER — ESCITALOPRAM OXALATE 10 MG PO TABS: 10.0000 mg | ORAL_TABLET | Freq: Every day | ORAL | 1 refills | Status: DC

## 2023-02-15 NOTE — Assessment & Plan Note (Signed)
Chronic Improved Successfully tapered off Effexor without difficulty and has started on Lexapro 10 mg daily He is tolerating new medication well without side effects The new medication has made a difference and he feels much better Appetite is good, sleep is good He is decreased his alcohol intake and plans on continuing to decrease it and hopefully work on smoking cessation He is becoming more active and looking into different hobbies Continue Lexapro 10 mg daily Discussed that we can increase this if needed at any time Follow-up in 6 months

## 2023-02-15 NOTE — Assessment & Plan Note (Signed)
Chronic BP controlled Continue lisinopril 20 mg daily

## 2023-02-15 NOTE — Assessment & Plan Note (Signed)
Chronic Has decreased how much she is drinking and will continue to work on decreasing his alcohol intake now that depression and anxiety have improved

## 2023-03-11 ENCOUNTER — Other Ambulatory Visit: Payer: Self-pay | Admitting: Internal Medicine

## 2023-03-29 DIAGNOSIS — K080 Exfoliation of teeth due to systemic causes: Secondary | ICD-10-CM | POA: Diagnosis not present

## 2023-04-17 DIAGNOSIS — Z7951 Long term (current) use of inhaled steroids: Secondary | ICD-10-CM | POA: Diagnosis not present

## 2023-04-17 DIAGNOSIS — I4892 Unspecified atrial flutter: Secondary | ICD-10-CM | POA: Diagnosis not present

## 2023-04-17 DIAGNOSIS — Z7982 Long term (current) use of aspirin: Secondary | ICD-10-CM | POA: Diagnosis not present

## 2023-04-17 DIAGNOSIS — J45990 Exercise induced bronchospasm: Secondary | ICD-10-CM | POA: Diagnosis not present

## 2023-04-17 DIAGNOSIS — R0789 Other chest pain: Secondary | ICD-10-CM | POA: Diagnosis not present

## 2023-04-17 DIAGNOSIS — R42 Dizziness and giddiness: Secondary | ICD-10-CM | POA: Diagnosis not present

## 2023-04-17 DIAGNOSIS — R079 Chest pain, unspecified: Secondary | ICD-10-CM | POA: Diagnosis not present

## 2023-04-17 DIAGNOSIS — F17210 Nicotine dependence, cigarettes, uncomplicated: Secondary | ICD-10-CM | POA: Diagnosis not present

## 2023-04-19 DIAGNOSIS — K080 Exfoliation of teeth due to systemic causes: Secondary | ICD-10-CM | POA: Diagnosis not present

## 2023-04-20 ENCOUNTER — Ambulatory Visit: Payer: Medicare Other | Attending: Cardiology | Admitting: Cardiology

## 2023-04-20 ENCOUNTER — Encounter: Payer: Self-pay | Admitting: Cardiology

## 2023-04-20 VITALS — BP 132/82 | HR 75 | Ht 74.0 in | Wt 230.0 lb

## 2023-04-20 DIAGNOSIS — I2510 Atherosclerotic heart disease of native coronary artery without angina pectoris: Secondary | ICD-10-CM

## 2023-04-20 DIAGNOSIS — Z8679 Personal history of other diseases of the circulatory system: Secondary | ICD-10-CM | POA: Diagnosis not present

## 2023-04-20 DIAGNOSIS — Z952 Presence of prosthetic heart valve: Secondary | ICD-10-CM

## 2023-04-20 DIAGNOSIS — I493 Ventricular premature depolarization: Secondary | ICD-10-CM | POA: Diagnosis not present

## 2023-04-20 DIAGNOSIS — E782 Mixed hyperlipidemia: Secondary | ICD-10-CM | POA: Diagnosis not present

## 2023-04-20 DIAGNOSIS — I10 Essential (primary) hypertension: Secondary | ICD-10-CM

## 2023-04-20 MED ORDER — METOPROLOL SUCCINATE ER 25 MG PO TB24: 12.5000 mg | ORAL_TABLET | Freq: Every day | ORAL | 1 refills | Status: DC

## 2023-04-20 MED ORDER — APIXABAN 5 MG PO TABS: 5.0000 mg | ORAL_TABLET | Freq: Two times a day (BID) | ORAL | 6 refills | Status: DC

## 2023-04-20 NOTE — Progress Notes (Signed)
Cardiology Office Note  Date: 04/20/2023   ID: Danny Johnson, DOB 1950-11-09, MRN [id number]  History of Present Illness: Danny Johnson is a 72 y.o. male last seen in September.  He is here for a follow-up visit.  I reviewed interval records.  He was seen at [facility] in [place] on November 24 for evaluation of chest discomfort at which point he was diagnosed with atrial flutter and RVR, newly documented.  He was treated with intravenous diltiazem and metoprolol for rate control, spontaneously converted to sinus rhythm.  Troponin T level minimally increased at 23 to 24 most consistent with demand ischemia.  Chest x-ray without acute findings.  He was prescribed Toprol XL to be used as needed and recommended to follow-up as an outpatient with cardiology.  We discussed the situation today.  He states that he was watching television when he started having a fullness in his head and subsequently chest pressure prompting the above evaluation.  No similar symptoms since that time.  He did not feel any distinct palpitations.  I reviewed his medications.  Cardiac regimen includes aspirin, Lipitor, Toprol-XL to be used as needed (has not taken), and lisinopril.  CHA2DS2-VASc score is 3.  We discussed stroke prophylaxis and a switch from aspirin to Eliquis.  ECG today shows sinus rhythm with PAC and PVC.  Discussed alcohol intake, he has on average 4 beverages a day.  Discussed reducing this to no more than 2.  Physical Exam: VS:  BP 132/82   Pulse 75   Ht 6\' 2"  (1.88 m)   Wt 230 lb (104.3 kg)   SpO2 97%   BMI 29.53 kg/m , BMI Body mass index is 29.53 kg/m.  Wt Readings from Last 3 Encounters:  04/20/23 230 lb (104.3 kg)  02/15/23 227 lb (103 kg)  02/11/23 228 lb (103.4 kg)    General: Patient appears comfortable at rest. HEENT: Conjunctiva and lids normal. Neck: Supple, no elevated JVP or carotid bruits. Lungs: Clear to auscultation, nonlabored breathing at rest. Cardiac: Regular rate  and rhythm, no S3, 1/6 systolic murmur, no pericardial rub. Extremities: No pitting edema,.  ECG:  An ECG dated 02/03/2022 was personally reviewed today and demonstrated:  Sinus rhythm with leftward axis and PVC.  Labwork: 01/04/2023: ALT 31; AST 28; BUN 18; Creatinine, Ser 0.74; Hemoglobin 15.3; Platelets 216.0; Potassium 4.7; Sodium 141; TSH 1.65     Component Value Date/Time   CHOL 166 01/04/2023 1040   TRIG 92.0 01/04/2023 1040   TRIG 89 10/25/2008 0000   HDL 69.80 01/04/2023 1040   CHOLHDL 2 01/04/2023 1040   VLDL 18.4 01/04/2023 1040   LDLCALC 78 01/04/2023 1040   LDLDIRECT 153.1 06/29/2012 0929  November 2024: Hemoglobin 15.3, platelets 206, AST 31, ALT 33, potassium 3.5, BUN 14, creatinine 0.96  Other Studies Reviewed Today:  Echocardiogram 01/25/2023:  1. Left ventricular ejection fraction, by estimation, is 50 to 55%. The  left ventricle has low normal function. Left ventricular endocardial  border not optimally defined to evaluate regional wall motion. Left  ventricular diastolic parameters were  normal.   2. Right ventricular systolic function is normal. The right ventricular  size is normal. Tricuspid regurgitation signal is inadequate for assessing  PA pressure.   3. The mitral valve is abnormal, mildly calcified with mild anterior  leaflet prolapse. Trivial mitral valve regurgitation.   4. The aortic valve has been repaired/replaced. Aortic valve  regurgitation is not visualized. There is a 29 mm Sapien prosthetic (TAVR)  valve present in the aortic position. Procedure Date: 02/03/2021. Aortic  valve mean gradient measures 11.7 mmHg.   5. The inferior vena cava is normal in size with greater than 50%  respiratory variability, suggesting right atrial pressure of 3 mmHg.   Assessment and Plan:  1.  Recently documented atrial flutter with RVR, spontaneously converted to sinus rhythm with rate control during evaluation in the [facility] system.  I reviewed available  records.  CHA2DS2-VASc score is 3.  As per above discussion, plan at this time is to stop aspirin and replace it with Eliquis 5 mg twice daily for stroke prophylaxis.  He will take Toprol-XL 12.5 mg daily for now.  I reviewed his baseline lab work.  Plan to bring him back in the next 4 to 6 weeks and most likely place a ZIO monitor at that time.  2.  Bicuspid aortic valve with severe aortic stenosis status post TAVR with 29 mm Edwards SAPIEN 3 THV in September 2022.  Recent follow-up echocardiogram shows mean AV gradient approximately 12 mmHg and no paravalvular regurgitation.  He is symptomatically stable, NYHA class II dyspnea.  Follow-up repeat echocardiogram in 1 year.   3.  Mild CAD by cardiac catheterization in August 2022.  LVEF 50 to 55% by recent follow-up echocardiogram.  Continue Lipitor.   4.  Primary hypertension.  He is on lisinopril.  5.  Hyperlipidemia, LDL 78 in August.  Tolerating Lipitor 20 mg daily which we will continue for now planning on reevaluation of lipids next year.  Disposition:  Follow up  4 to 6 weeks.  Signed, Jonelle Sidle, M.D., F.A.C.C. [facility]

## 2023-04-20 NOTE — Patient Instructions (Addendum)
Medication Instructions:  Your physician has recommended you make the following change in your medication:  Take metoprolol succinate 12.5 mg daily Stop aspirin Start eliquis 5 mg twice daily  Labwork: none  Testing/Procedures: none  Follow-Up: Your physician recommends that you schedule a follow-up appointment in: 4-6 weeks  Any Other Special Instructions Will Be Listed Below (If Applicable).  If you need a refill on your cardiac medications before your next appointment, please call your pharmacy.

## 2023-04-26 ENCOUNTER — Ambulatory Visit: Payer: Medicare Other | Admitting: Nurse Practitioner

## 2023-04-26 DIAGNOSIS — K080 Exfoliation of teeth due to systemic causes: Secondary | ICD-10-CM | POA: Diagnosis not present

## 2023-05-13 ENCOUNTER — Encounter: Payer: Self-pay | Admitting: Internal Medicine

## 2023-05-16 ENCOUNTER — Ambulatory Visit: Payer: Medicare Other | Admitting: Internal Medicine

## 2023-05-30 ENCOUNTER — Ambulatory Visit: Payer: Medicare Other | Admitting: Cardiology

## 2023-05-30 ENCOUNTER — Encounter: Payer: Self-pay | Admitting: Cardiology

## 2023-06-06 ENCOUNTER — Other Ambulatory Visit: Payer: Self-pay | Admitting: Cardiology

## 2023-06-28 ENCOUNTER — Encounter: Payer: Self-pay | Admitting: Cardiology

## 2023-06-29 DIAGNOSIS — Z1330 Encounter for screening examination for mental health and behavioral disorders, unspecified: Secondary | ICD-10-CM | POA: Diagnosis not present

## 2023-06-29 DIAGNOSIS — K4090 Unilateral inguinal hernia, without obstruction or gangrene, not specified as recurrent: Secondary | ICD-10-CM | POA: Diagnosis not present

## 2023-07-06 ENCOUNTER — Other Ambulatory Visit: Payer: Self-pay | Admitting: Internal Medicine

## 2023-07-08 ENCOUNTER — Other Ambulatory Visit: Payer: Self-pay | Admitting: Physician Assistant

## 2023-07-30 ENCOUNTER — Encounter: Payer: Self-pay | Admitting: Cardiology

## 2023-08-01 DIAGNOSIS — C4359 Malignant melanoma of other part of trunk: Secondary | ICD-10-CM | POA: Diagnosis not present

## 2023-08-01 DIAGNOSIS — L570 Actinic keratosis: Secondary | ICD-10-CM | POA: Diagnosis not present

## 2023-08-01 DIAGNOSIS — D1801 Hemangioma of skin and subcutaneous tissue: Secondary | ICD-10-CM | POA: Diagnosis not present

## 2023-08-01 DIAGNOSIS — L578 Other skin changes due to chronic exposure to nonionizing radiation: Secondary | ICD-10-CM | POA: Diagnosis not present

## 2023-08-01 DIAGNOSIS — L821 Other seborrheic keratosis: Secondary | ICD-10-CM | POA: Diagnosis not present

## 2023-08-08 ENCOUNTER — Other Ambulatory Visit: Payer: Self-pay | Admitting: Cardiology

## 2023-08-08 MED ORDER — METOPROLOL SUCCINATE ER 25 MG PO TB24: 25.0000 mg | ORAL_TABLET | Freq: Every day | ORAL | 1 refills | Status: DC

## 2023-08-23 ENCOUNTER — Encounter: Payer: Self-pay | Admitting: Cardiology

## 2023-08-23 ENCOUNTER — Ambulatory Visit: Payer: Medicare Other | Attending: Cardiology | Admitting: Cardiology

## 2023-08-23 VITALS — BP 116/78 | HR 68 | Ht 74.0 in | Wt 227.6 lb

## 2023-08-23 DIAGNOSIS — Z952 Presence of prosthetic heart valve: Secondary | ICD-10-CM

## 2023-08-23 DIAGNOSIS — E782 Mixed hyperlipidemia: Secondary | ICD-10-CM

## 2023-08-23 DIAGNOSIS — I2510 Atherosclerotic heart disease of native coronary artery without angina pectoris: Secondary | ICD-10-CM

## 2023-08-23 DIAGNOSIS — I10 Essential (primary) hypertension: Secondary | ICD-10-CM | POA: Diagnosis not present

## 2023-08-23 DIAGNOSIS — I493 Ventricular premature depolarization: Secondary | ICD-10-CM | POA: Diagnosis not present

## 2023-08-23 DIAGNOSIS — Z8679 Personal history of other diseases of the circulatory system: Secondary | ICD-10-CM

## 2023-08-23 NOTE — Progress Notes (Signed)
    Cardiology Office Note  Date: 08/23/2023   ID: Danny Johnson, DOB 05-29-1950, MRN [id number]  History of Present Illness: Danny Johnson is a 73 y.o. male last seen in November 2024.  He is here for a follow-up visit.  Interval chart reviewed, he was experiencing increased PVCs documented by his home Kardia monitor.  We uptitrated Toprol-XL to 25 mg daily and this has resolved.  He does not report any exertional chest pain or palpitations, no orthostatic lightheadedness or syncope.  He is back playing golf 3 days a week.  We reviewed his medications today.  He does not report any spontaneous bleeding problems on Eliquis.  I reviewed his ECG today which shows sinus rhythm with left anterior fascicular block.  Physical Exam: VS:  BP 116/78   Pulse 68   Ht 6\' 2"  (1.88 m)   Wt 227 lb 9.6 oz (103.2 kg)   SpO2 96%   BMI 29.22 kg/m , BMI Body mass index is 29.22 kg/m.  Wt Readings from Last 3 Encounters:  08/23/23 227 lb 9.6 oz (103.2 kg)  04/20/23 230 lb (104.3 kg)  02/15/23 227 lb (103 kg)    General: Patient appears comfortable at rest. HEENT: Conjunctiva and lids normal. Lungs: Clear to auscultation, nonlabored breathing at rest. Cardiac: Regular rate and rhythm, no S3, 1/6 systolic murmur.  ECG:  An ECG dated 04/20/2023 was personally reviewed today and demonstrated:  Sinus rhythm with PAC and PVC.  Labwork: 01/04/2023: ALT 31; AST 28; BUN 18; Creatinine, Ser 0.74; Hemoglobin 15.3; Platelets 216.0; Potassium 4.7; Sodium 141; TSH 1.65     Component Value Date/Time   CHOL 166 01/04/2023 1040   TRIG 92.0 01/04/2023 1040   TRIG 89 10/25/2008 0000   HDL 69.80 01/04/2023 1040   CHOLHDL 2 01/04/2023 1040   VLDL 18.4 01/04/2023 1040   LDLCALC 78 01/04/2023 1040   LDLDIRECT 153.1 06/29/2012 4098   Other Studies Reviewed Today:  No interval cardiac testing for review today.  Assessment and Plan:  1.  Paroxysmal atrial flutter. CHA2DS2-VASc score is 3.  Asymptomatic with no  obvious recurrence.  Continue Eliquis 5 mg twice daily for stroke prophylaxis.  2.  PVCs.  Improved following increase in Toprol-XL to 25 mg daily.  ECG reviewed.  Continue observation for now.   3.  Bicuspid aortic valve with severe aortic stenosis status post TAVR with 29 mm Edwards SAPIEN 3 THV in September 2022.  Follow-up echocardiogram in September 2024 showed mean AV gradient approximately 12 mmHg and no paravalvular regurgitation.   4.  Mild CAD by cardiac catheterization in August 2022.  No angina.  Continue Lipitor 20 mg daily.   5.  Primary hypertension.  Blood pressure well-controlled on current regimen also including lisinopril 20 mg daily.  Continue to track blood pressure at home, if systolic decreases further or he becomes symptomatic would decrease lisinopril to 10 mg daily.   6.  Mixed hyperlipidemia.  Continue Lipitor 20 mg daily.  Disposition:  Follow up  6 months.  Signed, Jonelle Sidle, M.D., F.A.C.C. [facility]

## 2023-08-23 NOTE — Patient Instructions (Addendum)

## 2023-09-03 ENCOUNTER — Other Ambulatory Visit: Payer: Self-pay | Admitting: Internal Medicine

## 2023-09-19 ENCOUNTER — Encounter: Payer: Self-pay | Admitting: Internal Medicine

## 2023-09-19 NOTE — Progress Notes (Unsigned)
 Subjective:    Patient ID: Danny Johnson, male    DOB: 1950-11-22, 73 y.o.   MRN: [id number]     HPI Danny Johnson is here for follow up of his chronic medical problems.   Youngest daughter in hosp - alcoholic - may be up for a liver transplant as long as she does not drink.  He has been a nervous wreck regarding this and drinking more than he should.  He wants to quit.    Drinking 6-7 scotch a day.  He has successfully stopped drinking, but restarted with increased stress.  Medications and allergies reviewed with patient and updated if appropriate.  Current Outpatient Medications on File Prior to Visit  Medication Sig Dispense Refill   albuterol  (VENTOLIN  HFA) 108 (90 Base) MCG/ACT inhaler INHALE 2 PUFFS BY MOUTH EVERY 6 HOURS AS NEEDED FOR WHEEZING OR SHORTNESS OF BREATH 54 g 2   amoxicillin  (AMOXIL ) 500 MG tablet Take 4 tablets by mouth 1 hour before dental procedures and cleanings 12 tablet 6   apixaban  (ELIQUIS ) 5 MG TABS tablet Take 1 tablet (5 mg total) by mouth 2 (two) times daily. 60 tablet 6   atorvastatin  (LIPITOR) 20 MG tablet TAKE 1 TABLET BY MOUTH DAILY 90 tablet 1   escitalopram  (LEXAPRO ) 10 MG tablet TAKE 1 TABLET BY MOUTH DAILY 90 tablet 1   lisinopril  (ZESTRIL ) 20 MG tablet TAKE 1 TABLET BY MOUTH DAILY 90 tablet 1   metoprolol  succinate (TOPROL -XL) 25 MG 24 hr tablet Take 1 tablet (25 mg total) by mouth daily. 90 tablet 1   Multiple Vitamin (MULTIVITAMIN WITH MINERALS) TABS tablet Take 1 tablet by mouth daily.     PREVIDENT 5000 BOOSTER PLUS 1.1 % PSTE SMARTSIG:Sparingly By Mouth Every Night     sildenafil  (REVATIO ) 20 MG tablet TAKE 3-5 TABLETS BY MOUTH DAILY AS NEEDED 60 tablet 2   No current facility-administered medications on file prior to visit.     Review of Systems  Constitutional:  Positive for fatigue.  Respiratory:  Negative for cough, shortness of breath and wheezing.   Cardiovascular:  Negative for chest pain, palpitations and leg swelling.   Musculoskeletal:        Cramping at night in legs  Neurological:  Positive for light-headedness (when he has not eaten). Negative for headaches.  Psychiatric/Behavioral:  Positive for dysphoric mood. The patient is nervous/anxious.        Objective:   Vitals:   09/20/23 1423  BP: 108/70  Pulse: 66  Temp: 98.1 F (36.7 C)  SpO2: 97%   BP Readings from Last 3 Encounters:  09/20/23 108/70  08/23/23 116/78  04/20/23 132/82   Wt Readings from Last 3 Encounters:  09/20/23 225 lb (102.1 kg)  08/23/23 227 lb 9.6 oz (103.2 kg)  04/20/23 230 lb (104.3 kg)   Body mass index is 28.89 kg/m.    Physical Exam Constitutional:      General: He is not in acute distress.    Appearance: Normal appearance. He is not ill-appearing.  HENT:     Head: Normocephalic and atraumatic.  Eyes:     Conjunctiva/sclera: Conjunctivae normal.  Cardiovascular:     Rate and Rhythm: Normal rate and regular rhythm.     Heart sounds: Normal heart sounds.  Pulmonary:     Effort: Pulmonary effort is normal. No respiratory distress.     Breath sounds: Normal breath sounds. No wheezing or rales.  Musculoskeletal:     Right lower leg: No  edema.     Left lower leg: No edema.  Skin:    General: Skin is warm and dry.     Findings: No rash.  Neurological:     Mental Status: He is alert. Mental status is at baseline.  Psychiatric:        Mood and Affect: Mood normal.        Lab Results  Component Value Date   WBC 5.3 01/04/2023   HGB 15.3 01/04/2023   HCT 45.8 01/04/2023   PLT 216.0 01/04/2023   GLUCOSE 108 (H) 01/04/2023   CHOL 166 01/04/2023   TRIG 92.0 01/04/2023   HDL 69.80 01/04/2023   LDLDIRECT 153.1 06/29/2012   LDLCALC 78 01/04/2023   ALT 31 01/04/2023   AST 28 01/04/2023   NA 141 01/04/2023   K 4.7 01/04/2023   CL 103 01/04/2023   CREATININE 0.74 01/04/2023   BUN 18 01/04/2023   CO2 29 01/04/2023   TSH 1.65 01/04/2023   PSA 0.59 01/04/2023   INR 0.9 01/30/2021   HGBA1C 5.7  01/04/2023     Assessment & Plan:    See Problem List for Assessment and Plan of chronic medical problems.

## 2023-09-19 NOTE — Patient Instructions (Addendum)
      Blood work was ordered.       Medications changes include :   None    A referral was ordered and someone will call you to schedule an appointment.     Return in about 6 months (around 03/21/2024) for Physical Exam.

## 2023-09-20 ENCOUNTER — Encounter: Payer: Self-pay | Admitting: Internal Medicine

## 2023-09-20 ENCOUNTER — Ambulatory Visit (INDEPENDENT_AMBULATORY_CARE_PROVIDER_SITE_OTHER): Admitting: Internal Medicine

## 2023-09-20 VITALS — BP 108/70 | HR 66 | Temp 98.1°F | Wt 225.0 lb

## 2023-09-20 DIAGNOSIS — F1020 Alcohol dependence, uncomplicated: Secondary | ICD-10-CM

## 2023-09-20 DIAGNOSIS — E782 Mixed hyperlipidemia: Secondary | ICD-10-CM | POA: Diagnosis not present

## 2023-09-20 DIAGNOSIS — I10 Essential (primary) hypertension: Secondary | ICD-10-CM

## 2023-09-20 DIAGNOSIS — F32A Depression, unspecified: Secondary | ICD-10-CM

## 2023-09-20 DIAGNOSIS — R7303 Prediabetes: Secondary | ICD-10-CM

## 2023-09-20 DIAGNOSIS — Z125 Encounter for screening for malignant neoplasm of prostate: Secondary | ICD-10-CM | POA: Diagnosis not present

## 2023-09-20 DIAGNOSIS — F419 Anxiety disorder, unspecified: Secondary | ICD-10-CM

## 2023-09-20 DIAGNOSIS — I4892 Unspecified atrial flutter: Secondary | ICD-10-CM | POA: Insufficient documentation

## 2023-09-20 DIAGNOSIS — N529 Male erectile dysfunction, unspecified: Secondary | ICD-10-CM

## 2023-09-20 DIAGNOSIS — J4530 Mild persistent asthma, uncomplicated: Secondary | ICD-10-CM

## 2023-09-20 LAB — LIPID PANEL
Cholesterol: 160 mg/dL (ref 0–200)
HDL: 74.7 mg/dL (ref >39.00)
LDL Cholesterol: 68 mg/dL (ref 0–99)
NonHDL: 84.83
Total CHOL/HDL Ratio: 2
Triglycerides: 85 mg/dL (ref 0.0–149.0)
VLDL: 17 mg/dL (ref 0.0–40.0)

## 2023-09-20 LAB — COMPREHENSIVE METABOLIC PANEL WITH GFR
ALT: 24 U/L (ref 0–53)
AST: 22 U/L (ref 0–37)
Albumin: 4.6 g/dL (ref 3.5–5.2)
Alkaline Phosphatase: 76 U/L (ref 39–117)
BUN: 10 mg/dL (ref 6–23)
CO2: 30 meq/L (ref 19–32)
Calcium: 9.3 mg/dL (ref 8.4–10.5)
Chloride: 100 meq/L (ref 96–112)
Creatinine, Ser: 0.72 mg/dL (ref 0.40–1.50)
GFR: 89.88 mL/min (ref >60.00)
Glucose, Bld: 89 mg/dL (ref 70–99)
Potassium: 4.5 meq/L (ref 3.5–5.1)
Sodium: 136 meq/L (ref 135–145)
Total Bilirubin: 0.5 mg/dL (ref 0.2–1.2)
Total Protein: 6.7 g/dL (ref 6.0–8.3)

## 2023-09-20 LAB — CBC WITH DIFFERENTIAL/PLATELET
Basophils Absolute: 0.1 10*3/uL (ref 0.0–0.1)
Basophils Relative: 0.9 % (ref 0.0–3.0)
Eosinophils Absolute: 0.3 10*3/uL (ref 0.0–0.7)
Eosinophils Relative: 5.9 % — ABNORMAL HIGH (ref 0.0–5.0)
HCT: 46 % (ref 39.0–52.0)
Hemoglobin: 15.6 g/dL (ref 13.0–17.0)
Lymphocytes Relative: 27.8 % (ref 12.0–46.0)
Lymphs Abs: 1.6 10*3/uL (ref 0.7–4.0)
MCHC: 34 g/dL (ref 30.0–36.0)
MCV: 102.8 fl — ABNORMAL HIGH (ref 78.0–100.0)
Monocytes Absolute: 0.7 10*3/uL (ref 0.1–1.0)
Monocytes Relative: 11.9 % (ref 3.0–12.0)
Neutro Abs: 3.2 10*3/uL (ref 1.4–7.7)
Neutrophils Relative %: 53.5 % (ref 43.0–77.0)
Platelets: 214 10*3/uL (ref 150.0–400.0)
RBC: 4.47 Mil/uL (ref 4.22–5.81)
RDW: 13 % (ref 11.5–15.5)
WBC: 5.9 10*3/uL (ref 4.0–10.5)

## 2023-09-20 LAB — PSA, MEDICARE: PSA: 0.46 ng/mL (ref 0.10–4.00)

## 2023-09-20 LAB — HEMOGLOBIN A1C: Hgb A1c MFr Bld: 5.6 % (ref 4.6–6.5)

## 2023-09-20 MED ORDER — ESCITALOPRAM OXALATE 20 MG PO TABS: 20.0000 mg | ORAL_TABLET | Freq: Every day | ORAL | 1 refills | Status: DC

## 2023-09-20 MED ORDER — GABAPENTIN 100 MG PO CAPS: 100.0000 mg | ORAL_CAPSULE | Freq: Three times a day (TID) | ORAL | 0 refills | Status: DC

## 2023-09-20 NOTE — Assessment & Plan Note (Signed)
Chronic Check lipid panel, CMP Continue atorvastatin 20 mg daily Regular exercise and healthy diet encouraged  

## 2023-09-20 NOTE — Assessment & Plan Note (Signed)
 Chronic Lab Results  Component Value Date   HGBA1C 5.7 01/04/2023   Check a1c Low sugar / carb diet Stressed regular exercise

## 2023-09-20 NOTE — Assessment & Plan Note (Addendum)
 Chronic Not controlled Appetite is good, sleep is good Increase Lexapro  to 20 mg daily Stressed that he needs to be seeing a therapist for his anxiety and depression in addition to his alcohol use-he agrees and he is already seen therapist in the past and will contact her

## 2023-09-20 NOTE — Assessment & Plan Note (Signed)
 Chronic He is actively drinking too much and he does want to quit Currently drinking 6-7 shots of scotch a day In the past he did successfully quit drinking with gabapentin-start gabapentin 100 mg 3 times daily x 10 days-10 use longer if needed Stressed that he needs to see a therapist Will increase Lexapro  to 20 mg daily since there is some underlying anxiety and depression

## 2023-09-20 NOTE — Assessment & Plan Note (Addendum)
 Chronic Mild, persistent Controlled Not using Advair  Continue albuterol  prn

## 2023-09-20 NOTE — Assessment & Plan Note (Signed)
 Chronic Sildenafil  60-100 mg daily prn

## 2023-09-20 NOTE — Assessment & Plan Note (Signed)
 Chronic Following with cardiology On Eliquis  5 mg twice daily, metoprolol  XL 25 mg daily CBC, CMP

## 2023-09-20 NOTE — Assessment & Plan Note (Signed)
 Chronic BP controlled CBC, CMP Continue lisinopril  20 mg daily

## 2023-09-21 ENCOUNTER — Encounter: Payer: Self-pay | Admitting: Internal Medicine

## 2023-09-21 ENCOUNTER — Other Ambulatory Visit: Payer: Self-pay | Admitting: Internal Medicine

## 2023-09-21 ENCOUNTER — Other Ambulatory Visit: Payer: Self-pay

## 2023-09-21 MED ORDER — GABAPENTIN 100 MG PO CAPS: 100.0000 mg | ORAL_CAPSULE | Freq: Three times a day (TID) | ORAL | 0 refills | Status: DC

## 2023-09-29 ENCOUNTER — Other Ambulatory Visit: Payer: Self-pay | Admitting: Internal Medicine

## 2023-09-29 DIAGNOSIS — K080 Exfoliation of teeth due to systemic causes: Secondary | ICD-10-CM | POA: Diagnosis not present

## 2023-09-29 DIAGNOSIS — H25013 Cortical age-related cataract, bilateral: Secondary | ICD-10-CM | POA: Diagnosis not present

## 2023-10-06 DIAGNOSIS — F17210 Nicotine dependence, cigarettes, uncomplicated: Secondary | ICD-10-CM | POA: Diagnosis not present

## 2023-10-06 DIAGNOSIS — K4090 Unilateral inguinal hernia, without obstruction or gangrene, not specified as recurrent: Secondary | ICD-10-CM | POA: Diagnosis not present

## 2023-10-14 ENCOUNTER — Other Ambulatory Visit: Payer: Self-pay | Admitting: Cardiology

## 2023-10-18 ENCOUNTER — Other Ambulatory Visit: Payer: Self-pay | Admitting: Internal Medicine

## 2023-11-10 ENCOUNTER — Ambulatory Visit: Payer: Medicare Other

## 2023-11-11 ENCOUNTER — Other Ambulatory Visit: Payer: Self-pay | Admitting: Cardiology

## 2023-11-11 NOTE — Telephone Encounter (Signed)
 Prescription refill request for Eliquis  received. Indication:aflutter Last office visit:4/25 Scr:0.72  4/25 Age: 73 Weight:102.1  kg  Prescription refilled

## 2023-11-15 ENCOUNTER — Other Ambulatory Visit: Payer: Self-pay | Admitting: Cardiology

## 2023-11-16 ENCOUNTER — Encounter: Payer: Self-pay | Admitting: Cardiology

## 2023-11-23 ENCOUNTER — Ambulatory Visit

## 2023-12-20 ENCOUNTER — Encounter: Payer: Self-pay | Admitting: Internal Medicine

## 2023-12-20 ENCOUNTER — Encounter: Payer: Self-pay | Admitting: Cardiology

## 2023-12-21 ENCOUNTER — Ambulatory Visit: Payer: Self-pay

## 2023-12-21 NOTE — Telephone Encounter (Signed)
 FYI Only or Action Required?: FYI only for provider.  Patient was last seen in primary care on 09/20/2023 by Danny Glade PARAS, MD.  Called Nurse Triage reporting Blood Sugar Problem.  Symptoms began yesterday.  Interventions attempted: Nothing.  Symptoms are: comes and goes.  Triage Disposition: See PCP When Office is Open (Within 3 Days), Call PCP Within 24 Hours  Patient/caregiver understands and will follow disposition?: No, refuses disposition    Copied from CRM [id number]. Topic: Clinical - Red Word Triage >> Dec 21, 2023  2:43 PM Danny Johnson wrote: Red Word that prompted transfer to Nurse Triage:patient is having low blood sugar readings. 70 in the morning and barely over 105 after a meal. Patient gets lightheaded, shaky and feel tingling in his chest. It affects his driving. Reason for Disposition  [1] Blood glucose 70 mg/dL (3.9 mmol/L) or below OR symptomatic, now improved with Care Advice AND [2] cause unknown  [1] Fall in systolic BP > 20 mm Hg from normal AND [2] NOT feeling weak or lightheaded  Answer Assessment - Initial Assessment Questions 1. SYMPTOMS: What symptoms are you concerned about?     Lightheadedness, shaky, tingling in chest 2. ONSET:  When did the symptoms start?     yesterday 3. BLOOD GLUCOSE: What is your blood glucose level?      70's 4. USUAL RANGE: What is your blood glucose level usually? (e.g., usual fasting morning value, usual evening value)     170 5. TYPE 1 or 2:  Do you know what type of diabetes you have?  (e.g., Type 1, Type 2, Gestational; doesn't know)      Pre-dm 6. INSULIN: Do you take insulin? What type of insulin(s) do you use? What is the mode of delivery? (syringe, pen; injection or pump) When did you last give yourself an insulin dose? (i.e., time or hours/minutes ago) How much did you give? (i.e., how many units)     na 7. DIABETES PILLS: Do you take any pills for your diabetes? If Yes, ask: What is the name of the  medicine(s) that you take for high blood sugar?     no 8. OTHER SYMPTOMS: Do you have any symptoms? (e.g., fever, frequent urination, difficulty breathing, vomiting)     no 9. LOW BLOOD GLUCOSE TREATMENT: What have you done so far to treat the low blood glucose level?     nothing 10. FOOD: When did you last eat or drink?       na 11. ALONE: Are you alone right now or is someone with you?        na 12. PREGNANCY: Is there any chance you are pregnant? When was your last menstrual period?       na When driving pt becomes shaky & lightheaded. Yesterday 80 blood sugar.  In the mornings and has been w/n range from 70-80's. Pt does not report any s/s: just wanted to make PCP aware.  Nurse offered appt and pt stated new medications were ordered for him , he is going to start taking them as instructed and if s/s continue pt will call back  Answer Assessment - Initial Assessment Questions 1. BLOOD PRESSURE: What is your blood pressure? Did you take at least two measurements 5 minutes apart?     100/65 in the mornings 2. ONSET: When did you take your blood pressure?     *No Answer* 3. HOW: How did you take your blood pressure? (e.g., visiting nurse, automatic home BP monitor)     *  No Answer* 4. HISTORY: Do you have a history of low blood pressure? What is your blood pressure normally?     *No Answer* 5. MEDICINES: Are you taking any medicines for blood pressure? If Yes, ask: Have they been changed recently?     Pt stated doctor just changed his medication 6. PULSE RATE: Do you know what your pulse rate is?      *No Answer* 7. OTHER SYMPTOMS: Have you been sick recently? Have you had a recent injury?     na 8. PREGNANCY: Is there any chance you are pregnant? When was your last menstrual period?     na  120/78 in office  Protocols used: Diabetes - Low Blood Sugar-A-AH, Blood Pressure - Low-A-AH

## 2023-12-22 ENCOUNTER — Ambulatory Visit

## 2023-12-22 VITALS — Ht 73.0 in | Wt 221.0 lb

## 2023-12-22 DIAGNOSIS — Z0000 Encounter for general adult medical examination without abnormal findings: Secondary | ICD-10-CM

## 2023-12-22 NOTE — Patient Instructions (Addendum)
 Sr. Danny Johnson , Thank you for taking time out of your busy schedule to complete your Annual Wellness Visit with me. I enjoyed our conversation and look forward to speaking with you again next year. I, as well as your care team,  appreciate your ongoing commitment to your health goals. Please review the following plan we discussed and let me know if I can assist you in the future. Your Game plan/ To Do List    Referrals: If you haven't heard from the office you've been referred to, please reach out to them at the phone provided.   Follow up Visits: We will see or speak with you next year for your Next Medicare AWV with our clinical staff Have you seen your provider in the last 6 months (3 months if uncontrolled diabetes)? No  Clinician Recommendations:  Aim for 30 minutes of exercise or brisk walking, 6-8 glasses of water, and 5 servings of fruits and vegetables each day. Educate and advised on getting the 2nd/3rd Hepatitis B vaccines in 2025.      This is a list of the screenings recommended for you:  Health Maintenance  Topic Date Due   Hepatitis B Vaccine (2 of 3 - 19+ 3-dose series) 12/03/1989   COVID-19 Vaccine (4 - 2024-25 season) 01/23/2023   Flu Shot  12/23/2023   Medicare Annual Wellness Visit  12/21/2024   DTaP/Tdap/Td vaccine (3 - Td or Tdap) 07/23/2029   Colon Cancer Screening  09/23/2031   Pneumococcal Vaccine for age over 72  Completed   Hepatitis C Screening  Completed   HPV Vaccine  Aged Out   Meningitis B Vaccine  Aged Out   Zoster (Shingles) Vaccine  Discontinued    Advanced directives: (Provided) Advance directive discussed with you today. I have provided a copy for you to complete at home and have notarized. Once this is complete, please bring a copy in to our office so we can scan it into your chart.  Advance Care Planning is important because it:  [x]  Makes sure you receive the medical care that is consistent with your values, goals, and preferences  [x]  It provides  guidance to your family and loved ones and reduces their decisional burden about whether or not they are making the right decisions based on your wishes.  Follow the link provided in your after visit summary or read over the paperwork we have mailed to you to help you started getting your Advance Directives in place. If you need assistance in completing these, please reach out to us  so that we can help you!

## 2023-12-22 NOTE — Progress Notes (Signed)
 Subjective:   Danny Johnson is a 73 y.o. who presents for a Medicare Wellness preventive visit.  As a reminder, Annual Wellness Visits don't include a physical exam, and some assessments may be limited, especially if this visit is performed virtually. We may recommend an in-person follow-up visit with your provider if needed.  Visit Complete: Virtual I connected with  Danny Johnson on 12/22/23 by a audio enabled telemedicine application and verified that I am speaking with the correct person using two identifiers.  Patient Location: Home  Provider Location: Office/Clinic  I discussed the limitations of evaluation and management by telemedicine. The patient expressed understanding and agreed to proceed.  Vital Signs: Because this visit was a virtual/telehealth visit, some criteria may be missing or patient reported. Any vitals not documented were not able to be obtained and vitals that have been documented are patient reported.  VideoDeclined- This patient declined Librarian, academic. Therefore the visit was completed with audio only.  Persons Participating in Visit: Patient.  AWV Questionnaire: Yes: Patient Medicare AWV questionnaire was completed by the patient on 12/20/2023; I have confirmed that all information answered by patient is correct and no changes since this date.  Cardiac Risk Factors include: advanced age (>77men, >40 women);dyslipidemia;hypertension;male gender     Objective:    Today's Vitals   12/22/23 1014  Weight: 221 lb (100.2 kg)  Height: 6' 1 (1.854 m)   Body mass index is 29.16 kg/m.     12/22/2023   10:13 AM 11/04/2022    2:24 PM 09/02/2021    2:03 PM 02/03/2021    2:34 PM 01/30/2021    3:06 PM 01/28/2021   11:35 AM 01/12/2021    7:49 AM  Advanced Directives  Does Patient Have a Medical Advance Directive? No No No No No No No  Would patient like information on creating a medical advance directive? Yes  (MAU/Ambulatory/Procedural Areas - Information given) No - Patient declined No - Patient declined No - Patient declined No - Patient declined Yes (MAU/Ambulatory/Procedural Areas - Information given) Yes (MAU/Ambulatory/Procedural Areas - Information given)    Current Medications (verified) Outpatient Encounter Medications as of 12/22/2023  Medication Sig   albuterol  (VENTOLIN  HFA) 108 (90 Base) MCG/ACT inhaler INHALE 2 PUFFS BY MOUTH EVERY 6 HOURS AS NEEDED FOR WHEEZING OR SHORTNESS OF BREATH   amoxicillin  (AMOXIL ) 500 MG tablet Take 4 tablets by mouth 1 hour before dental procedures and cleanings   atorvastatin  (LIPITOR) 20 MG tablet TAKE 1 TABLET BY MOUTH DAILY   ELIQUIS  5 MG TABS tablet TAKE 1 TABLET(5 MG) BY MOUTH TWICE DAILY   escitalopram  (LEXAPRO ) 10 MG tablet TAKE 1 TABLET BY MOUTH DAILY   escitalopram  (LEXAPRO ) 20 MG tablet Take 1 tablet (20 mg total) by mouth daily.   gabapentin  (NEURONTIN ) 100 MG capsule TAKE 1 CAPSULE(100 MG) BY MOUTH THREE TIMES DAILY   lisinopril  (ZESTRIL ) 20 MG tablet TAKE 1 TABLET BY MOUTH DAILY   metoprolol  succinate (TOPROL -XL) 25 MG 24 hr tablet Take 1 tablet (25 mg total) by mouth daily.   Multiple Vitamin (MULTIVITAMIN WITH MINERALS) TABS tablet Take 1 tablet by mouth daily.   PREVIDENT 5000 BOOSTER PLUS 1.1 % PSTE SMARTSIG:Sparingly By Mouth Every Night   sildenafil  (REVATIO ) 20 MG tablet TAKE 3-5 TABLETS BY MOUTH DAILY AS NEEDED   No facility-administered encounter medications on file as of 12/22/2023.    Allergies (verified) Dust mite extract, Other, and Symbicort [budesonide-formoterol fumarate]   History: Past Medical History:  Diagnosis Date   Anxiety    Arthritis    Asthma    Bicuspid aortic valve    Coronary atherosclerosis of native coronary artery    Minimal, LVEF 60%   Depression    Essential hypertension, benign    History of hiatal hernia 12/2020   Melanoma (HCC) 1993   Mixed hyperlipidemia    PVC's (premature ventricular  contractions)    S/P TAVR (transcatheter aortic valve replacement) 02/03/2021   29mm Edwards S3 via the TF approach by Dr. Susy and Dr. Lucas   Sleep apnea    Past Surgical History:  Procedure Laterality Date   HERNIA REPAIR  12/2017   INTRAOPERATIVE TRANSTHORACIC ECHOCARDIOGRAM Left 02/03/2021   Procedure: INTRAOPERATIVE TRANSTHORACIC ECHOCARDIOGRAM;  Surgeon: Verlin Lonni BIRCH, MD;  Location: [facility] OR;  Service: Open Heart Surgery;  Laterality: Left;   RIGHT/LEFT HEART CATH AND CORONARY ANGIOGRAPHY N/A 01/12/2021   Procedure: RIGHT/LEFT HEART CATH AND CORONARY ANGIOGRAPHY;  Surgeon: Verlin Lonni BIRCH, MD;  Location: [facility] INVASIVE CV LAB;  Service: Cardiovascular;  Laterality: N/A;   TONSILLECTOMY     TRANSCATHETER AORTIC VALVE REPLACEMENT, TRANSFEMORAL N/A 02/03/2021   Procedure: TRANSCATHETER AORTIC VALVE REPLACEMENT, TRANSFEMORAL;  Surgeon: Verlin Lonni BIRCH, MD;  Location: [facility] OR;  Service: Open Heart Surgery;  Laterality: N/A;   ULTRASOUND GUIDANCE FOR VASCULAR ACCESS Bilateral 02/03/2021   Procedure: ULTRASOUND GUIDANCE FOR VASCULAR ACCESS;  Surgeon: Verlin Lonni BIRCH, MD;  [facility];  Service: Open Heart Surgery;  Laterality: Bilateral;   Family History  Problem Relation Age of Onset   Dementia Mother    Heart Problems Mother    Heart Problems Father    Social History   Socioeconomic History   Marital status: Divorced    Spouse name: Not on file   Number of children: 3   Years of education: Not on file   Highest education level: Bachelor's degree (e.g., BA, AB, BS)  Occupational History   Occupation: Retired-Safety and Environmental Compliance  Tobacco Use   Smoking status: Every Day    Current packs/day: 0.50    Average packs/day: 0.5 packs/day for 22.4 years (11.2 ttl pk-yrs)    Types: Cigarettes    Start date: 07/21/2001   Smokeless tobacco: Never   Tobacco comments:    quit 2019  Vaping Use   Vaping status: Never Used  Substance and  Sexual Activity   Alcohol use: Yes    Alcohol/week: 46.0 standard drinks of alcohol    Types: 4 Shots of liquor, 42 Standard drinks or equivalent per week    Comment: 4-6 drinks daily, beer/wine/scotch   Drug use: No   Sexual activity: Yes  Other Topics Concern   Not on file  Social History Narrative   Lives with Partner   Social Drivers of Health   Financial Resource Strain: Low Risk  (12/22/2023)   Overall Financial Resource Strain (CARDIA)    Difficulty of Paying Living Expenses: Not hard at all  Food Insecurity: No Food Insecurity (12/22/2023)   Hunger Vital Sign    Worried About Running Out of Food in the Last Year: Never true    Ran Out of Food in the Last Year: Never true  Transportation Needs: No Transportation Needs (12/22/2023)   PRAPARE - Administrator, Civil Service (Medical): No    Lack of Transportation (Non-Medical): No  Physical Activity: Insufficiently Active (12/22/2023)   Exercise Vital Sign    Days of Exercise per Week: 3 days    Minutes of  Exercise per Session: 40 min  Stress: No Stress Concern Present (12/22/2023)   [employer] of Occupational Health - Occupational Stress Questionnaire    Feeling of Stress: Only a little  Social Connections: Socially Integrated (12/22/2023)   Social Connection and Isolation Panel    Frequency of Communication with Friends and Family: Three times a week    Frequency of Social Gatherings with Friends and Family: More than three times a week    Attends Religious Services: More than 4 times per year    Active Member of [employer] or Organizations: Yes    Attends Engineer, structural: More than 4 times per year    Marital Status: Living with partner    Tobacco Counseling Ready to quit: No Counseling given: No Tobacco comments: quit 2019    Clinical Intake:  Pre-visit preparation completed: Yes  Pain : No/denies pain     BMI - recorded: 29.16 Nutritional Status: BMI 25 -29  Overweight Nutritional Risks: None Diabetes: No  Lab Results  Component Value Date   HGBA1C 5.6 09/20/2023   HGBA1C 5.7 01/04/2023   HGBA1C 5.9 06/16/2022     How often do you need to have someone help you when you read instructions, pamphlets, or other written materials from your doctor or pharmacy?: 1 - Never  Interpreter Needed?: No  Information entered by :: Verdie Saba, CMA   Activities of Daily Living     12/22/2023   10:17 AM 12/20/2023    3:09 PM  In your present state of health, do you have any difficulty performing the following activities:  Hearing? 0 0  Vision? 0 0  Difficulty concentrating or making decisions? 0 0  Walking or climbing stairs? 0 0  Dressing or bathing? 0 0  Doing errands, shopping? 0 0  Preparing Food and eating ? N N  Using the Toilet? N N  In the past six months, have you accidently leaked urine? N N  Do you have problems with loss of bowel control? N N  Managing your Medications? N N  Managing your Finances? N N  Housekeeping or managing your Housekeeping? N N    Patient Care Team: Geofm Glade PARAS, MD as PCP - General (Internal Medicine) Debera Jayson MATSU, MD as PCP - Cardiology (Cardiology) Shona Rush, MD (Dermatology) Szabat, Toribio BROCKS, [facility] (Inactive) as Pharmacist (Pharmacist)  I have updated your Care Teams any recent Medical Services you may have received from other providers in the past year.     Assessment:   This is a routine wellness examination for Jarad.  Hearing/Vision screen Hearing Screening - Comments:: Denies hearing difficulties   Vision Screening - Comments:: Wears rx glasses - up to date with routine eye exams with an Ophthalmologist    Goals Addressed               This Visit's Progress     Patient Stated (pt-stated)        Patient stated he plans to eat better and exercise more - plays golf 3x a week       Depression Screen     12/22/2023   10:22 AM 02/15/2023    1:43 PM 01/04/2023    1:14 PM  11/04/2022    2:22 PM 06/16/2022    9:56 AM 06/16/2022    9:55 AM 12/14/2021    1:05 PM  PHQ 2/9 Scores  PHQ - 2 Score 0 2 2 0 0 0 0  PHQ- 9 Score 0 5 5  2  0    Fall Risk     12/22/2023   10:17 AM 12/20/2023    3:09 PM 11/19/2023   11:11 AM 01/04/2023    1:14 PM 11/04/2022    2:24 PM  Fall Risk   Falls in the past year? 0 1 1 0 0  Comment confirmed with pt - no falls      Number falls in past yr: 0 0 0 0 0  Injury with Fall? 0 0 0 0 0  Risk for fall due to : No Fall Risks   No Fall Risks No Fall Risks  Follow up Falls evaluation completed;Falls prevention discussed   Falls evaluation completed Falls prevention discussed    MEDICARE RISK AT HOME:  Medicare Risk at Home Any stairs in or around the home?: Yes If so, are there any without handrails?: No Home free of loose throw rugs in walkways, pet beds, electrical cords, etc?: Yes Adequate lighting in your home to reduce risk of falls?: Yes Life alert?: No Use of a cane, walker or w/c?: No Grab bars in the bathroom?: Yes Shower chair or bench in shower?: No Elevated toilet seat or a handicapped toilet?: No  TIMED UP AND GO:  Was the test performed?  No  Cognitive Function: 6CIT completed        12/22/2023   10:18 AM 11/04/2022    2:25 PM 09/02/2021    2:17 PM  6CIT Screen  What Year? 0 points 0 points 0 points  What month? 0 points 0 points 0 points  What time? 0 points 0 points 0 points  Count back from 20 0 points 0 points 0 points  Months in reverse 0 points 0 points 0 points  Repeat phrase 0 points 0 points 0 points  Total Score 0 points 0 points 0 points    Immunizations Immunization History  Administered Date(s) Administered   Hepatitis B 11/05/1989   Influenza Split 03/25/2019   Influenza Whole 03/18/2008, 06/19/2009   Influenza, High Dose Seasonal PF 02/01/2020   Influenza, Seasonal, Injecte, Preservative Fre 07/05/2012   Influenza-Unspecified 02/28/2017, 01/22/2018, 02/05/2022   PFIZER(Purple  Top)SARS-COV-2 Vaccination 06/13/2019, 07/04/2019, 03/07/2020   Pneumococcal Conjugate-13 08/26/2014   Pneumococcal Polysaccharide-23 04/11/2008, 10/03/2015   Pneumococcal-Unspecified 04/06/2019   Td 07/24/2019   Tdap 06/18/2011   Zoster, Live 08/21/2013    Screening Tests Health Maintenance  Topic Date Due   Hepatitis B Vaccines (2 of 3 - 19+ 3-dose series) 12/03/1989   COVID-19 Vaccine (4 - 2024-25 season) 01/23/2023   INFLUENZA VACCINE  12/23/2023   Medicare Annual Wellness (AWV)  12/21/2024   DTaP/Tdap/Td (3 - Td or Tdap) 07/23/2029   Colonoscopy  09/23/2031   Pneumococcal Vaccine: 50+ Years  Completed   Hepatitis C Screening  Completed   HPV VACCINES  Aged Out   Meningococcal B Vaccine  Aged Out   Zoster Vaccines- Shingrix  Discontinued    Health Maintenance  Health Maintenance Due  Topic Date Due   Hepatitis B Vaccines (2 of 3 - 19+ 3-dose series) 12/03/1989   COVID-19 Vaccine (4 - 2024-25 season) 01/23/2023   Health Maintenance Items Addressed: 12/22/2023  Additional Screening:  Vision Screening: Recommended annual ophthalmology exams for early detection of glaucoma and other disorders of the eye. Would you like a referral to an eye doctor? No  Patient stated he's had an eye exam with an Ophthalmologist (unable to recall name) in 2025.  Dental Screening: Recommended annual dental exams for proper oral hygiene  Community Resource Referral / Chronic Care Management: CRR required this visit?  No   CCM required this visit?  No   Plan:    I have personally reviewed and noted the following in the patient's chart:   Medical and social history Use of alcohol, tobacco or illicit drugs  Current medications and supplements including opioid prescriptions. Patient is not currently taking opioid prescriptions. Functional ability and status Nutritional status Physical activity Advanced directives List of other physicians Hospitalizations, surgeries, and ER visits in  previous 12 months Vitals Screenings to include cognitive, depression, and falls Referrals and appointments  In addition, I have reviewed and discussed with patient certain preventive protocols, quality metrics, and best practice recommendations. A written personalized care plan for preventive services as well as general preventive health recommendations were provided to patient.   Verdie CHRISTELLA Saba, CMA   12/22/2023   After Visit Summary: (MyChart) Due to this being a telephonic visit, the after visit summary with patients personalized plan was offered to patient via MyChart   Notes: Nothing significant to report at this time.

## 2023-12-26 NOTE — Progress Notes (Unsigned)
 Subjective:    Patient ID: Danny Johnson, male    DOB: [birth year], 73 y.o.   MRN: [id number]      HPI Danny Johnson is here for No chief complaint on file.  He plays golf about 3 times a week.  When he is driving home after playing he has noticed that he gets lightheaded and has a hollow like feeling in his chest.  When he got home he checks to sugars and it has been as low as 70 and his BP 1 day was 98/61.  He stopped the metoprolol  and that has helped.  He started drinking 2 Cokes while he is playing golf and has been eating some candy in case it was low sugar.  He still has a little bit of that sensation.  He does drink a lot of water with Gatorade.  He feels like it tends to occur mostly when he is driving.   He denies any chest pain, palpitations   Medications and allergies reviewed with patient and updated if appropriate.  Current Outpatient Medications on File Prior to Visit  Medication Sig Dispense Refill   albuterol  (VENTOLIN  HFA) 108 (90 Base) MCG/ACT inhaler INHALE 2 PUFFS BY MOUTH EVERY 6 HOURS AS NEEDED FOR WHEEZING OR SHORTNESS OF BREATH 54 g 2   amoxicillin  (AMOXIL ) 500 MG tablet Take 4 tablets by mouth 1 hour before dental procedures and cleanings 12 tablet 6   atorvastatin  (LIPITOR) 20 MG tablet TAKE 1 TABLET BY MOUTH DAILY 90 tablet 1   ELIQUIS  5 MG TABS tablet TAKE 1 TABLET(5 MG) BY MOUTH TWICE DAILY 60 tablet 6   escitalopram  (LEXAPRO ) 10 MG tablet TAKE 1 TABLET BY MOUTH DAILY 90 tablet 1   gabapentin  (NEURONTIN ) 100 MG capsule TAKE 1 CAPSULE(100 MG) BY MOUTH THREE TIMES DAILY 60 capsule 0   Multiple Vitamin (MULTIVITAMIN WITH MINERALS) TABS tablet Take 1 tablet by mouth daily.     PREVIDENT 5000 BOOSTER PLUS 1.1 % PSTE SMARTSIG:Sparingly By Mouth Every Night     sildenafil  (REVATIO ) 20 MG tablet TAKE 3-5 TABLETS BY MOUTH DAILY AS NEEDED 60 tablet 2   metoprolol  succinate (TOPROL -XL) 25 MG 24 hr tablet Take 1 tablet (25 mg total) by mouth daily. (Patient not taking:  Reported on 12/27/2023) 90 tablet 1   No current facility-administered medications on file prior to visit.    Review of Systems  Constitutional:  Negative for fever.  Respiratory:  Negative for cough, shortness of breath and wheezing.   Cardiovascular:  Negative for chest pain, palpitations and leg swelling.  Neurological:  Positive for light-headedness. Negative for headaches.       Objective:   Vitals:   12/27/23 1453  BP: 120/78  Pulse: 80  Temp: 98.5 F (36.9 C)  SpO2: 93%   BP Readings from Last 3 Encounters:  12/27/23 120/78  09/20/23 108/70  08/23/23 116/78   Wt Readings from Last 3 Encounters:  12/27/23 229 lb (103.9 kg)  12/22/23 221 lb (100.2 kg)  09/20/23 225 lb (102.1 kg)   Body mass index is 30.21 kg/m.    Physical Exam Constitutional:      General: He is not in acute distress.    Appearance: Normal appearance. He is not ill-appearing.  HENT:     Head: Normocephalic and atraumatic.  Eyes:     Conjunctiva/sclera: Conjunctivae normal.  Cardiovascular:     Rate and Rhythm: Normal rate and regular rhythm.     Heart sounds: Normal heart sounds.  Pulmonary:     Effort: Pulmonary effort is normal. No respiratory distress.     Breath sounds: Normal breath sounds. No wheezing or rales.  Musculoskeletal:     Right lower leg: No edema.     Left lower leg: No edema.  Skin:    General: Skin is warm and dry.     Findings: No rash.  Neurological:     Mental Status: He is alert. Mental status is at baseline.  Psychiatric:        Mood and Affect: Mood normal.            Assessment & Plan:    See Problem List for Assessment and Plan of chronic medical problems.

## 2023-12-27 ENCOUNTER — Encounter: Payer: Self-pay | Admitting: Internal Medicine

## 2023-12-27 ENCOUNTER — Ambulatory Visit (INDEPENDENT_AMBULATORY_CARE_PROVIDER_SITE_OTHER): Admitting: Internal Medicine

## 2023-12-27 VITALS — BP 120/78 | HR 80 | Temp 98.5°F | Ht 73.0 in | Wt 229.0 lb

## 2023-12-27 DIAGNOSIS — R42 Dizziness and giddiness: Secondary | ICD-10-CM | POA: Diagnosis not present

## 2023-12-27 DIAGNOSIS — I10 Essential (primary) hypertension: Secondary | ICD-10-CM | POA: Diagnosis not present

## 2023-12-27 MED ORDER — LISINOPRIL 10 MG PO TABS: 10.0000 mg | ORAL_TABLET | Freq: Every day | ORAL | 3 refills | Status: DC

## 2023-12-27 NOTE — Assessment & Plan Note (Signed)
 Chronic BP controlled, but may be having some hypotensive episodes causing lightheadedness and hollow feeling in his chest Decrease lisinopril  to 10 mg daily Monitor BP Let me know if symptoms do not improve

## 2023-12-27 NOTE — Patient Instructions (Addendum)
      Medications changes include :   decrease lisinopril  to 10 mg daily    Monitor your BP     Follow up if your symptoms are not improving

## 2023-12-27 NOTE — Assessment & Plan Note (Signed)
 Recently has been experiencing intermittent lightheadedness and a hollow feeling in his chest Often occurs when driving home from golf or other times when he is driving Discontinuing metoprolol  has helped Has increased sugar intake while playing golf and eats a lot of protein prior to playing golf-doubt it is the sugar Possible hypotension Decrease lisinopril  to 10 mg daily to see if that helps Monitor BP If symptoms are not resolving advised with let me know

## 2024-01-05 ENCOUNTER — Other Ambulatory Visit: Payer: Self-pay | Admitting: Physician Assistant

## 2024-01-05 MED ORDER — LISINOPRIL 10 MG PO TABS
10.0000 mg | ORAL_TABLET | Freq: Every day | ORAL | 2 refills | Status: AC
Start: 2024-01-05 — End: ?

## 2024-02-09 ENCOUNTER — Ambulatory Visit: Payer: Self-pay | Admitting: Cardiology

## 2024-02-09 ENCOUNTER — Ambulatory Visit (HOSPITAL_COMMUNITY)
Admission: RE | Admit: 2024-02-09 | Discharge: 2024-02-09 | Disposition: A | Discharge status: Home / Self Care | Payer: Medicare Other | Source: Ambulatory Visit | Attending: Cardiology | Admitting: Cardiology

## 2024-02-09 DIAGNOSIS — Z952 Presence of prosthetic heart valve: Secondary | ICD-10-CM | POA: Diagnosis not present

## 2024-02-09 LAB — ECHOCARDIOGRAM COMPLETE
AV Mean grad: 9 mmHg
AV Peak grad: 18.7 mmHg
Ao pk vel: 2.16 m/s
Area-P 1/2: 2.42 cm2
S' Lateral: 3.8 cm

## 2024-02-09 NOTE — Progress Notes (Signed)
*  PRELIMINARY RESULTS* Echocardiogram 2D Echocardiogram has been performed.  Danny Johnson 02/09/2024, 2:28 PM

## 2024-02-10 ENCOUNTER — Encounter: Payer: Self-pay | Admitting: Cardiology

## 2024-02-16 ENCOUNTER — Other Ambulatory Visit: Payer: Self-pay | Admitting: Internal Medicine

## 2024-03-07 ENCOUNTER — Encounter: Payer: Self-pay | Admitting: Internal Medicine

## 2024-03-07 ENCOUNTER — Encounter: Payer: Self-pay | Admitting: Cardiology

## 2024-03-07 ENCOUNTER — Ambulatory Visit: Attending: Cardiology | Admitting: Cardiology

## 2024-03-07 ENCOUNTER — Other Ambulatory Visit: Payer: Self-pay | Admitting: Internal Medicine

## 2024-03-07 VITALS — BP 132/84 | HR 78 | Ht 73.5 in | Wt 233.4 lb

## 2024-03-07 DIAGNOSIS — I2510 Atherosclerotic heart disease of native coronary artery without angina pectoris: Secondary | ICD-10-CM | POA: Diagnosis not present

## 2024-03-07 DIAGNOSIS — Z952 Presence of prosthetic heart valve: Secondary | ICD-10-CM

## 2024-03-07 DIAGNOSIS — Z8679 Personal history of other diseases of the circulatory system: Secondary | ICD-10-CM

## 2024-03-07 DIAGNOSIS — I493 Ventricular premature depolarization: Secondary | ICD-10-CM | POA: Diagnosis not present

## 2024-03-07 DIAGNOSIS — I10 Essential (primary) hypertension: Secondary | ICD-10-CM | POA: Diagnosis not present

## 2024-03-07 DIAGNOSIS — E782 Mixed hyperlipidemia: Secondary | ICD-10-CM

## 2024-03-07 NOTE — Patient Instructions (Addendum)

## 2024-03-07 NOTE — Progress Notes (Signed)
 Cardiology Office Note  Date: 03/07/2024   ID: Danny Johnson, DOB 30-Jul-1950, MRN [id number]  History of Present Illness: CHARLE Johnson is a 73 y.o. male last seen in April.  He is here for a routine visit.  States that he has been feeling well.  Plays golf 3 days a week.  Palpitations have settled down on current dose of Toprol -XL 25 mg daily.  I reviewed his medications which are otherwise stable.  He reports no spontaneous bleeding problems on Eliquis .  Echocardiogram from September revealed LVEF 50 to 55% with stable TAVR function and mean gradient 9 mmHg.  Physical Exam: VS:  BP 132/84   Pulse 78   Ht 6' 1.5 (1.867 m)   Wt 233 lb 6.4 oz (105.9 kg)   SpO2 95%   BMI 30.38 kg/m , BMI Body mass index is 30.38 kg/m.  Wt Readings from Last 3 Encounters:  03/07/24 233 lb 6.4 oz (105.9 kg)  12/27/23 229 lb (103.9 kg)  12/22/23 221 lb (100.2 kg)    General: Patient appears comfortable at rest. HEENT: Conjunctiva and lids normal. Neck: Supple, no elevated JVP or carotid bruits. Lungs: Johnson to auscultation, nonlabored breathing at rest. Cardiac: Regular rate and rhythm, no S3, 1/6 systolic murmur. Extremities: No pitting edema.  ECG:  An ECG dated 01/23/2024 was personally reviewed today and demonstrated:  Sinus rhythm with left anterior fascicular block.  Labwork: 09/20/2023: ALT 24; AST 22; BUN 10; Creatinine, Ser 0.72; Hemoglobin 15.6; Platelets 214.0; Potassium 4.5; Sodium 136     Component Value Date/Time   CHOL 160 09/20/2023 1515   TRIG 85.0 09/20/2023 1515   TRIG 89 10/25/2008 0000   HDL 74.70 09/20/2023 1515   CHOLHDL 2 09/20/2023 1515   VLDL 17.0 09/20/2023 1515   LDLCALC 68 09/20/2023 1515   LDLDIRECT 153.1 06/29/2012 0929   Other Studies Reviewed Today:  Echocardiogram 02/09/2024:  1. Left ventricular ejection fraction, by estimation, is 50 to 55%. The  left ventricle has low normal function. The left ventricle has no regional  wall motion abnormalities.  Left ventricular diastolic parameters are  consistent with Grade I diastolic  dysfunction (impaired relaxation).   2. Right ventricular systolic function is normal. The right ventricular  size is normal. Tricuspid regurgitation signal is inadequate for assessing  PA pressure.   3. The mitral valve is normal in structure. Trivial mitral valve  regurgitation. No evidence of mitral stenosis.   4. Edwards Sapien 3 THV size 29 mm is in the AV position. . The aortic  valve has been repaired/replaced. Aortic valve regurgitation is not  visualized. No aortic stenosis is present.   5. IVC is small suggesting low RA pressure and hypovolemia.   Assessment and Plan:  1.  Paroxysmal atrial flutter. CHA2DS2-VASc score is 3.  Symptomatically stable with no obvious recurrence.  He continues on Eliquis  5 mg twice daily.  No spontaneous bleeding problems reported.   2.  PVCs.  No progressive symptoms on Toprol -XL 25 mg daily.   3.  Bicuspid aortic valve with severe aortic stenosis status post TAVR with 29 mm Edwards SAPIEN 3 THV in September 2022.  Echocardiogram in September shows stable prosthetic function with mean AV gradient 9 mmHg.   4.  Mild CAD by cardiac catheterization in August 2022.  No active angina at current level of activity.  Continue Lipitor 20 mg daily.   5.  Primary hypertension.  No change in present regimen which also includes lisinopril  10 mg  daily.  Continue to track blood pressure at home.   6.  Mixed hyperlipidemia.  LDL 68 in April.  Continue Lipitor 20 mg daily.  Disposition:  Follow up 6 months.  Signed, Jayson [name], M.D., F.A.C.C. [facility]

## 2024-03-08 ENCOUNTER — Other Ambulatory Visit: Payer: Self-pay

## 2024-03-08 MED ORDER — GABAPENTIN 100 MG PO CAPS: 100.0000 mg | ORAL_CAPSULE | Freq: Three times a day (TID) | ORAL | 0 refills | Status: DC

## 2024-03-23 ENCOUNTER — Ambulatory Visit: Admitting: Internal Medicine

## 2024-03-26 DIAGNOSIS — E538 Deficiency of other specified B group vitamins: Secondary | ICD-10-CM | POA: Insufficient documentation

## 2024-03-26 NOTE — Progress Notes (Unsigned)
 Subjective:    Patient ID: Danny Johnson, male    DOB: 1951/02/11, 73 y.o.   MRN: [id number]     HPI Yecheskel is here for a physical exam and his chronic medical problems.  Was having dizziness -- he had his wax removed at [facility] and the dizziness resolved.   He is drinking alcohol but not daily.  He can drink 4-5 drinks in one day.  He does feel anxiety and depressed.     Medications and allergies reviewed with patient and updated if appropriate.  Current Outpatient Medications on File Prior to Visit  Medication Sig Dispense Refill   albuterol  (VENTOLIN  HFA) 108 (90 Base) MCG/ACT inhaler INHALE 2 PUFFS BY MOUTH EVERY 6 HOURS AS NEEDED FOR WHEEZING OR SHORTNESS OF BREATH 54 g 2   amoxicillin  (AMOXIL ) 500 MG tablet Take 4 tablets by mouth 1 hour before dental procedures and cleanings 12 tablet 6   atorvastatin  (LIPITOR) 20 MG tablet TAKE 1 TABLET BY MOUTH DAILY 90 tablet 1   ELIQUIS  5 MG TABS tablet TAKE 1 TABLET(5 MG) BY MOUTH TWICE DAILY 60 tablet 6   escitalopram  (LEXAPRO ) 10 MG tablet TAKE 1 TABLET BY MOUTH DAILY 90 tablet 1   lisinopril  (ZESTRIL ) 10 MG tablet Take 1 tablet (10 mg total) by mouth daily. 90 tablet 2   metoprolol  succinate (TOPROL -XL) 25 MG 24 hr tablet Take 1 tablet (25 mg total) by mouth daily. 90 tablet 1   Multiple Vitamin (MULTIVITAMIN WITH MINERALS) TABS tablet Take 1 tablet by mouth daily.     PREVIDENT 5000 BOOSTER PLUS 1.1 % PSTE SMARTSIG:Sparingly By Mouth Every Night     sildenafil  (REVATIO ) 20 MG tablet TAKE 3-5 TABLETS BY MOUTH DAILY AS NEEDED 60 tablet 2   No current facility-administered medications on file prior to visit.    Review of Systems  Constitutional:  Negative for fever.  Eyes:  Negative for visual disturbance.  Respiratory:  Negative for cough, shortness of breath and wheezing.   Cardiovascular:  Positive for palpitations (PVCs on occ). Negative for chest pain and leg swelling.  Gastrointestinal:  Negative for abdominal pain, blood in  stool, constipation and diarrhea.       No gerd  Genitourinary:  Negative for difficulty urinating and dysuria.  Musculoskeletal:  Negative for arthralgias and back pain.  Skin:  Negative for rash.  Neurological:  Positive for light-headedness (occ) and headaches (rare).  Psychiatric/Behavioral:  Positive for dysphoric mood (good days and bad days). The patient is nervous/anxious.        Objective:   Vitals:   03/27/24 1419  BP: 130/80  Pulse: 75  Temp: 98.6 F (37 C)  SpO2: 98%   Filed Weights   03/27/24 1419  Weight: 233 lb (105.7 kg)   Body mass index is 30.32 kg/m.  BP Readings from Last 3 Encounters:  03/27/24 130/80  03/07/24 132/84  12/27/23 120/78    Wt Readings from Last 3 Encounters:  03/27/24 233 lb (105.7 kg)  03/07/24 233 lb 6.4 oz (105.9 kg)  12/27/23 229 lb (103.9 kg)      Physical Exam Constitutional: He appears well-developed and well-nourished. No distress.  HENT:  Head: Normocephalic and atraumatic.  Right Ear: External ear normal.  Left Ear: External ear normal.  Normal ear canals and TM b/l  Mouth/Throat: Oropharynx is clear and moist. Eyes: Conjunctivae and EOM are normal.  Neck: Neck supple. No tracheal deviation present. No thyromegaly present.  No carotid bruit  Cardiovascular:  Normal rate, regular rhythm, normal heart sounds and intact distal pulses.   No murmur heard.  No lower extremity edema. Pulmonary/Chest: Effort normal and breath sounds normal. No respiratory distress. He has no wheezes. He has no rales.  Abdominal: Soft. He exhibits no distension. There is no tenderness.  Genitourinary: deferred  Lymphadenopathy:   He has no cervical adenopathy.  Skin: Skin is warm and dry. He is not diaphoretic.  Psychiatric: He has a normal mood and affect. His behavior is normal.         Assessment & Plan:   Physical exam: Screening blood work  ordered Exercise   golf 3/week, yard work, a little walking Weight  obese Substance  abuse   alcohol - 4-5 one day and may skip a day, smoking   Reviewed recommended immunizations.   Health Maintenance  Topic Date Due   Influenza Vaccine  12/23/2023   COVID-19 Vaccine (4 - 2025-26 season) 01/23/2024   Medicare Annual Wellness (AWV)  12/21/2024   DTaP/Tdap/Td (3 - Td or Tdap) 07/23/2029   Colonoscopy  09/23/2031   Pneumococcal Vaccine: 50+ Years  Completed   Hepatitis C Screening  Completed   Meningococcal B Vaccine  Aged Out   Hepatitis B Vaccines 19-59 Average Risk  Discontinued   Zoster Vaccines- Shingrix  Discontinued     See Problem List for Assessment and Plan of chronic medical problems.

## 2024-03-26 NOTE — Patient Instructions (Addendum)
 Blood work was ordered.       Medications changes include :   None    A referral was ordered and someone will call you to schedule an appointment.     Return in about 6 months (around 09/24/2024) for follow up.   Health Maintenance, Male Adopting a healthy lifestyle and getting preventive care are important in promoting health and wellness. Ask your health care provider about: The right schedule for you to have regular tests and exams. Things you can do on your own to prevent diseases and keep yourself healthy. What should I know about diet, weight, and exercise? Eat a healthy diet  Eat a diet that includes plenty of vegetables, fruits, low-fat dairy products, and lean protein. Do not eat a lot of foods that are high in solid fats, added sugars, or sodium. Maintain a healthy weight Body mass index (BMI) is a measurement that can be used to identify possible weight problems. It estimates body fat based on height and weight. Your health care provider can help determine your BMI and help you achieve or maintain a healthy weight. Get regular exercise Get regular exercise. This is one of the most important things you can do for your health. Most adults should: Exercise for at least 150 minutes each week. The exercise should increase your heart rate and make you sweat (moderate-intensity exercise). Do strengthening exercises at least twice a week. This is in addition to the moderate-intensity exercise. Spend less time sitting. Even light physical activity can be beneficial. Watch cholesterol and blood lipids Have your blood tested for lipids and cholesterol at 75 years of age, then have this test every 5 years. You may need to have your cholesterol levels checked more often if: Your lipid or cholesterol levels are high. You are older than 75 years of age. You are at high risk for heart disease. What should I know about cancer screening? Many types of cancers can be detected  early and may often be prevented. Depending on your health history and family history, you may need to have cancer screening at various ages. This may include screening for: Colorectal cancer. Prostate cancer. Skin cancer. Lung cancer. What should I know about heart disease, diabetes, and high blood pressure? Blood pressure and heart disease High blood pressure causes heart disease and increases the risk of stroke. This is more likely to develop in people who have high blood pressure readings or are overweight. Talk with your health care provider about your target blood pressure readings. Have your blood pressure checked: Every 3-5 years if you are 59-76 years of age. Every year if you are 14 years old or older. If you are between the ages of 93 and 37 and are a current or former smoker, ask your health care provider if you should have a one-time screening for abdominal aortic aneurysm (AAA). Diabetes Have regular diabetes screenings. This checks your fasting blood sugar level. Have the screening done: Once every three years after age 55 if you are at a normal weight and have a low risk for diabetes. More often and at a younger age if you are overweight or have a high risk for diabetes. What should I know about preventing infection? Hepatitis B If you have a higher risk for hepatitis B, you should be screened for this virus. Talk with your health care provider to find out if you are at risk for hepatitis B infection. Hepatitis C Blood testing is recommended for:  Everyone born from [birth year] through 1967. Anyone with known risk factors for hepatitis C. Sexually transmitted infections (STIs) You should be screened each year for STIs, including gonorrhea and chlamydia, if: You are sexually active and are younger than 75 years of age. You are older than 75 years of age and your health care provider tells you that you are at risk for this type of infection. Your sexual activity has changed since  you were last screened, and you are at increased risk for chlamydia or gonorrhea. Ask your health care provider if you are at risk. Ask your health care provider about whether you are at high risk for HIV. Your health care provider may recommend a prescription medicine to help prevent HIV infection. If you choose to take medicine to prevent HIV, you should first get tested for HIV. You should then be tested every 3 months for as long as you are taking the medicine. Follow these instructions at home: Alcohol use Do not drink alcohol if your health care provider tells you not to drink. If you drink alcohol: Limit how much you have to 0-2 drinks a day. Know how much alcohol is in your drink. In the [place] one drink equals one 12 oz bottle of beer (355 mL), one 5 oz glass of wine (148 mL), or one 1 oz glass of hard liquor (44 mL). Lifestyle Do not use any products that contain nicotine or tobacco. These products include cigarettes, chewing tobacco, and vaping devices, such as e-cigarettes. If you need help quitting, ask your health care provider. Do not use street drugs. Do not share needles. Ask your health care provider for help if you need support or information about quitting drugs. General instructions Schedule regular health, dental, and eye exams. Stay current with your vaccines. Tell your health care provider if: You often feel depressed. You have ever been abused or do not feel safe at home. Summary Adopting a healthy lifestyle and getting preventive care are important in promoting health and wellness. Follow your health care provider's instructions about healthy diet, exercising, and getting tested or screened for diseases. Follow your health care provider's instructions on monitoring your cholesterol and blood pressure. This information is not intended to replace advice given to you by your health care provider. Make sure you discuss any questions you have with your health care  provider. Document Revised: 09/29/2020 Document Reviewed: 09/29/2020 Elsevier Patient Education  2024 [employer].

## 2024-03-27 ENCOUNTER — Ambulatory Visit (INDEPENDENT_AMBULATORY_CARE_PROVIDER_SITE_OTHER): Admitting: Internal Medicine

## 2024-03-27 ENCOUNTER — Other Ambulatory Visit: Payer: Self-pay | Admitting: Internal Medicine

## 2024-03-27 VITALS — BP 130/80 | HR 75 | Temp 98.6°F | Ht 73.5 in | Wt 233.0 lb

## 2024-03-27 DIAGNOSIS — F17210 Nicotine dependence, cigarettes, uncomplicated: Secondary | ICD-10-CM

## 2024-03-27 DIAGNOSIS — F1020 Alcohol dependence, uncomplicated: Secondary | ICD-10-CM | POA: Diagnosis not present

## 2024-03-27 DIAGNOSIS — E782 Mixed hyperlipidemia: Secondary | ICD-10-CM | POA: Diagnosis not present

## 2024-03-27 DIAGNOSIS — Z125 Encounter for screening for malignant neoplasm of prostate: Secondary | ICD-10-CM | POA: Diagnosis not present

## 2024-03-27 DIAGNOSIS — I10 Essential (primary) hypertension: Secondary | ICD-10-CM

## 2024-03-27 DIAGNOSIS — E538 Deficiency of other specified B group vitamins: Secondary | ICD-10-CM

## 2024-03-27 DIAGNOSIS — R7303 Prediabetes: Secondary | ICD-10-CM | POA: Diagnosis not present

## 2024-03-27 DIAGNOSIS — Z0000 Encounter for general adult medical examination without abnormal findings: Secondary | ICD-10-CM | POA: Diagnosis not present

## 2024-03-27 DIAGNOSIS — J4530 Mild persistent asthma, uncomplicated: Secondary | ICD-10-CM

## 2024-03-27 DIAGNOSIS — F419 Anxiety disorder, unspecified: Secondary | ICD-10-CM

## 2024-03-27 DIAGNOSIS — F32A Depression, unspecified: Secondary | ICD-10-CM

## 2024-03-27 LAB — COMPREHENSIVE METABOLIC PANEL WITH GFR
ALT: 23 U/L (ref 0–53)
AST: 22 U/L (ref 0–37)
Albumin: 4.6 g/dL (ref 3.5–5.2)
Alkaline Phosphatase: 81 U/L (ref 39–117)
BUN: 13 mg/dL (ref 6–23)
CO2: 28 meq/L (ref 19–32)
Calcium: 9.5 mg/dL (ref 8.4–10.5)
Chloride: 97 meq/L (ref 96–112)
Creatinine, Ser: 0.78 mg/dL (ref 0.40–1.50)
GFR: 87.42 mL/min (ref >60.00)
Glucose, Bld: 99 mg/dL (ref 70–99)
Potassium: 4.2 meq/L (ref 3.5–5.1)
Sodium: 135 meq/L (ref 135–145)
Total Bilirubin: 0.6 mg/dL (ref 0.2–1.2)
Total Protein: 6.8 g/dL (ref 6.0–8.3)

## 2024-03-27 LAB — LIPID PANEL
Cholesterol: 150 mg/dL (ref 0–200)
HDL: 70.4 mg/dL (ref >39.00)
LDL Cholesterol: 63 mg/dL (ref 0–99)
NonHDL: 79.68
Total CHOL/HDL Ratio: 2
Triglycerides: 81 mg/dL (ref 0.0–149.0)
VLDL: 16.2 mg/dL (ref 0.0–40.0)

## 2024-03-27 LAB — CBC
HCT: 44.3 % (ref 39.0–52.0)
Hemoglobin: 15.3 g/dL (ref 13.0–17.0)
MCHC: 34.5 g/dL (ref 30.0–36.0)
MCV: 101.3 fl — ABNORMAL HIGH (ref 78.0–100.0)
Platelets: 209 K/uL (ref 150.0–400.0)
RBC: 4.37 Mil/uL (ref 4.22–5.81)
RDW: 13.5 % (ref 11.5–15.5)
WBC: 6.6 K/uL (ref 4.0–10.5)

## 2024-03-27 LAB — HEMOGLOBIN A1C: Hgb A1c MFr Bld: 5.8 % (ref 4.6–6.5)

## 2024-03-27 NOTE — Assessment & Plan Note (Signed)
 Chronic BP controlled Continue lisinopril  to 10 mg daily metoprolol  xl 25 mg daily Monitor BP Cmp, cbc

## 2024-03-27 NOTE — Assessment & Plan Note (Signed)
 Chronic Mild, persistent Controlled Not using Advair  Continue albuterol  prn

## 2024-03-27 NOTE — Assessment & Plan Note (Addendum)
 Chronic He is actively drinking can drink 4-5 drinks in one day but not drinking daily Wants to just drink in social situations Did start taking gabapentin  100 mg 3 times daily which helped him stop in the past but stopped it after reading certain side effects Stressed that he needs to see a therapist Has underlying anxiety and depression Can consider switching to a different SSRI

## 2024-03-27 NOTE — Assessment & Plan Note (Signed)
Chronic Check lipid panel, CMP Continue atorvastatin 20 mg daily Regular exercise and healthy diet encouraged  

## 2024-03-27 NOTE — Assessment & Plan Note (Signed)
Chronic Check B12  

## 2024-03-27 NOTE — Assessment & Plan Note (Addendum)
 Chronic  Not controlled This is driving some of his drinking Continue lexapro  10 mg daily Can consider switching to a different SSRI

## 2024-03-27 NOTE — Assessment & Plan Note (Signed)
 He is actively smoking States he smokes 4-5 cigarettes a day encouraged smoking cessation

## 2024-03-27 NOTE — Assessment & Plan Note (Signed)
 Chronic Lab Results  Component Value Date   HGBA1C 5.6 09/20/2023   Check a1c Low sugar / carb diet Stressed regular exercise

## 2024-03-28 LAB — PSA, MEDICARE: PSA: 0.36 ng/mL (ref 0.10–4.00)

## 2024-03-28 LAB — TSH: TSH: 1.39 u[IU]/mL (ref 0.35–5.50)

## 2024-03-28 LAB — VITAMIN B12: Vitamin B-12: 198 pg/mL — ABNORMAL LOW (ref 211–911)

## 2024-03-30 ENCOUNTER — Ambulatory Visit: Payer: Self-pay | Admitting: Internal Medicine

## 2024-03-31 LAB — VITAMIN B1: Vitamin B1 (Thiamine): 13 nmol/L (ref 8–30)

## 2024-04-05 DIAGNOSIS — K080 Exfoliation of teeth due to systemic causes: Secondary | ICD-10-CM | POA: Diagnosis not present

## 2024-04-16 ENCOUNTER — Encounter: Payer: Self-pay | Admitting: Internal Medicine

## 2024-04-22 ENCOUNTER — Other Ambulatory Visit: Payer: Self-pay | Admitting: Internal Medicine

## 2024-04-26 ENCOUNTER — Other Ambulatory Visit: Payer: Self-pay | Admitting: Cardiology

## 2024-05-03 DIAGNOSIS — K080 Exfoliation of teeth due to systemic causes: Secondary | ICD-10-CM | POA: Diagnosis not present

## 2024-05-08 ENCOUNTER — Other Ambulatory Visit: Payer: Self-pay | Admitting: Internal Medicine

## 2024-05-11 ENCOUNTER — Other Ambulatory Visit: Payer: Self-pay | Admitting: Cardiology

## 2024-05-16 ENCOUNTER — Other Ambulatory Visit: Payer: Self-pay | Admitting: Cardiology

## 2024-05-16 NOTE — Telephone Encounter (Signed)
 Prescription refill request for Eliquis  received. Indication: A Flutter Last office visit: 03/07/24  GORMAN Sierras MD Scr: 0.78 on 03/27/24  Epic Age: 73 Weight: 105.9kg  Based on above findings Eliquis  5mg  twice daily is the appropriate dose.  Refill approved.

## 2024-05-30 ENCOUNTER — Encounter: Payer: Self-pay | Admitting: Internal Medicine

## 2024-05-31 ENCOUNTER — Other Ambulatory Visit: Payer: Self-pay

## 2024-05-31 MED ORDER — ESCITALOPRAM OXALATE 10 MG PO TABS: 10.0000 mg | ORAL_TABLET | Freq: Every day | ORAL | 1 refills | Status: AC

## 2024-06-22 ENCOUNTER — Encounter: Payer: Self-pay | Admitting: Cardiology

## 2024-09-24 ENCOUNTER — Ambulatory Visit: Admitting: Cardiology
